# Patient Record
Sex: Female | Born: 1940 | Race: White | Hispanic: No
Health system: Midwestern US, Academic
[De-identification: ages and names within clinical notes are randomized; demographics above are authoritative.]

---

## 2016-08-05 ENCOUNTER — Ambulatory Visit: Admit: 2016-08-05 | Discharge: 2016-08-05 | Payer: MEDICARE

## 2016-08-05 ENCOUNTER — Ambulatory Visit: Admit: 2016-08-05 | Discharge: 2016-08-06 | Payer: MEDICARE

## 2016-08-05 ENCOUNTER — Encounter: Admit: 2016-08-05 | Discharge: 2016-08-05 | Payer: MEDICARE

## 2016-08-05 DIAGNOSIS — I350 Nonrheumatic aortic (valve) stenosis: ICD-10-CM

## 2016-08-05 DIAGNOSIS — I480 Paroxysmal atrial fibrillation: Principal | ICD-10-CM

## 2016-08-05 MED ORDER — PERFLUTREN LIPID MICROSPHERES 1.1 MG/ML IV SUSP
1-20 mL | Freq: Once | INTRAVENOUS | 0 refills | Status: CP
Start: 2016-08-05 — End: ?

## 2016-08-05 NOTE — Progress Notes
Peripheral IV Insertion Note:  Patient Side: right  Line Orientation:Antecubital  IV Catheter Size: 20G  Number of Attempts:1.  IV capped and flushed with Normal Saline.  IV site without redness, swelling, or pain.  New dressing placed.    After procedure IV cannula removed intact and hemostasis achieved.    Procedure explained, questions answered and Definity administered per standard without complications.   Total of 2 ml of Definity/NS given slow IVP per sonographer direction.

## 2016-08-09 ENCOUNTER — Encounter: Admit: 2016-08-09 | Discharge: 2016-08-09 | Payer: MEDICARE

## 2016-11-08 ENCOUNTER — Encounter: Admit: 2016-11-08 | Discharge: 2016-11-08 | Payer: MEDICARE

## 2016-11-08 ENCOUNTER — Ambulatory Visit: Admit: 2016-11-08 | Discharge: 2016-11-09 | Payer: MEDICARE

## 2016-11-08 DIAGNOSIS — Z9981 Dependence on supplemental oxygen: ICD-10-CM

## 2016-11-08 DIAGNOSIS — I10 Essential (primary) hypertension: ICD-10-CM

## 2016-11-08 DIAGNOSIS — R000 Tachycardia, unspecified: ICD-10-CM

## 2016-11-08 DIAGNOSIS — J449 Chronic obstructive pulmonary disease, unspecified: Principal | ICD-10-CM

## 2016-11-08 DIAGNOSIS — I350 Nonrheumatic aortic (valve) stenosis: ICD-10-CM

## 2016-11-08 DIAGNOSIS — I447 Left bundle-branch block, unspecified: ICD-10-CM

## 2016-11-08 DIAGNOSIS — R0602 Shortness of breath: ICD-10-CM

## 2016-11-08 DIAGNOSIS — K219 Gastro-esophageal reflux disease without esophagitis: ICD-10-CM

## 2016-11-09 ENCOUNTER — Encounter: Admit: 2016-11-09 | Discharge: 2016-11-09 | Payer: MEDICARE

## 2016-11-09 DIAGNOSIS — Z9981 Dependence on supplemental oxygen: ICD-10-CM

## 2016-11-09 DIAGNOSIS — K219 Gastro-esophageal reflux disease without esophagitis: ICD-10-CM

## 2016-11-09 DIAGNOSIS — J9809 Other diseases of bronchus, not elsewhere classified: ICD-10-CM

## 2016-11-09 DIAGNOSIS — I350 Nonrheumatic aortic (valve) stenosis: ICD-10-CM

## 2016-11-09 DIAGNOSIS — J449 Chronic obstructive pulmonary disease, unspecified: Principal | ICD-10-CM

## 2016-11-09 DIAGNOSIS — I10 Essential (primary) hypertension: ICD-10-CM

## 2016-11-11 ENCOUNTER — Encounter: Admit: 2016-11-11 | Discharge: 2016-11-11 | Payer: MEDICARE

## 2016-11-11 MED ORDER — CARVEDILOL 12.5 MG PO TAB
ORAL_TABLET | Freq: Two times a day (BID) | 2 refills | Status: SS
Start: 2016-11-11 — End: 2017-02-03

## 2016-12-05 ENCOUNTER — Encounter: Admit: 2016-12-05 | Discharge: 2016-12-05 | Payer: MEDICARE

## 2016-12-07 NOTE — Telephone Encounter
Received a message from [employer], Environmental health practitioner stating the patient had called to cancel her appointments for 10/2 as she is in the hospital at [facility] for diverticulitis. She asked that we call back later in the week to try and reschedule her appointments.

## 2016-12-08 NOTE — Telephone Encounter
Spoke to Ms. Sandate. Determined the best date for an appointment for evaluation of her AS.    Future Appointments  Date Time Provider [place]   01/03/2017 10:30 AM Shea Stakes, MD, [place]   01/03/2017 1:00 PM [id number] None

## 2016-12-09 NOTE — Telephone Encounter
An earlier appointment was available with Dr. Mackey Birchwood. Spoke to patient and she could make 10/8 at 4:30. Unable to get a PFT appointment on that day but with her worsening shortness of breath the earlier appointment is best. Will evaluate the need for PFT's after she is seen by Dr. Mackey Birchwood.

## 2016-12-12 ENCOUNTER — Encounter: Admit: 2016-12-12 | Discharge: 2016-12-12 | Payer: MEDICARE

## 2016-12-12 DIAGNOSIS — I350 Nonrheumatic aortic (valve) stenosis: Principal | ICD-10-CM

## 2016-12-12 DIAGNOSIS — Z01812 Encounter for preprocedural laboratory examination: ICD-10-CM

## 2016-12-12 MED ORDER — ASPIRIN 325 MG PO TAB
325 mg | Freq: Once | ORAL | 0 refills | Status: CN
Start: 2016-12-12 — End: ?

## 2016-12-12 MED ORDER — SODIUM CHLORIDE 0.9 % IV SOLP
250 mL | INTRAVENOUS | 0 refills | Status: CN | PRN
Start: 2016-12-12 — End: ?

## 2016-12-12 MED ORDER — LIDOCAINE (PF) 10 MG/ML (1 %) IJ SOLN
.1-2 mL | INTRAMUSCULAR | 0 refills | Status: CN | PRN
Start: 2016-12-12 — End: ?

## 2016-12-12 MED ORDER — SODIUM CHLORIDE 0.9 % IV SOLP
INTRAVENOUS | 0 refills | Status: CN
Start: 2016-12-12 — End: ?

## 2016-12-12 MED ORDER — ENOXAPARIN 120 MG/0.8 ML SC SYRG
120 mg | INJECTION | Freq: Two times a day (BID) | SUBCUTANEOUS | 1 refills | 7.00000 days | Status: AC
Start: 2016-12-12 — End: 2016-12-16

## 2016-12-12 NOTE — Progress Notes
Date of Service: 12/12/2016    Michelle Solis is a 76 y.o. female.       HPI     I had the pleasure of seeing Michelle Solis at the clinic today. she is a very pleasant 76 year old female which is accompanied by her daughter and granddaughter.   The past medical history including aortic stenosis. COPD. atrial flutter. morbid obesity, LBBB, cerebral aneurysm rupture and hypertension. patient was referred to our clinic for further evaluation of aortic stenosis.       Pt follow up with Dr. Dagoberto Reef. She has been follow up for AS and she was referred here considering worsening of sx and Echo in June that indicated drop in EF was 45%/calculated valve area now is 0.7 cm2.   Patient noticed worsening of shortness of breath and fatigue in the last year. She becomes SOB with mild psychical activity. She noticed LE edema and orthopnea as well.  She denies any chest pain or syncope though.  She was diagnosed with possible paroxysmal atrial flutter/atrial tach ~2013 and she was started on anticoagulation without any rhythm control. She noticed occasional dizziness at home, and she noted he rPR is ~ 110 at home, which is consistent with atrial flutter or tachy on today's EKG.    Patient was just discharged last week after being admitted for diverticulitis. Patient is compliant with medication.  We discussed the options for severe stenosis regarding open heart surgery and TAVR.  We also discussed the importance of controlling his atrial flutter/tach.            Vitals:    12/12/16 1622   BP: 138/88   Pulse: 114   SpO2: (!) 89%   Weight: 132.5 kg (292 lb)   Height: 1.626 m (5' 4)     Body mass index is 50.12 kg/m???.     Past Medical History  Patient Active Problem List    Diagnosis Date Noted   ??? Severe aortic stenosis 11/08/2016   ??? Bronchitis 03/24/2016   ??? Swelling of left lower extremity 11/24/2015   ??? Sinus tachycardia 11/24/2015   ??? Iron deficiency anemia 08/21/2014   ??? Shortness of breath 08/21/2014 ??? Right knee pain 02/25/2014   ??? Obesity, Class III, BMI 40-49.9 (morbid obesity) (HCC) 02/20/2014   ??? Chronic respiratory failure with hypoxia (HCC) 09/26/2013   ??? severe aortic valve stenosis 09/26/2013   ??? Mild diastolic dysfunction 09/26/2013   ??? LBBB (left bundle branch block) 09/26/2013   ??? Fracture, proximal femur (HCC) 06/14/2013     S/p Rt CMN 06/15/13     ??? A-fib [facility]) 09/13/2011     Diagnosed June 2013     ??? 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 COPD (chronic obstructive pulmonary disease) (HCC) 09/13/2011   ??? Hypertension 09/13/2011   ??? GERD (gastroesophageal reflux disease) 09/13/2011         Review of Systems   Constitution: Positive for weakness, malaise/fatigue and night sweats.   HENT: Positive for nosebleeds.    Eyes: Negative.    Cardiovascular: Positive for dyspnea on exertion, irregular heartbeat, leg swelling and palpitations.   Respiratory: Positive for cough and shortness of breath.    Endocrine: Negative.    Hematologic/Lymphatic: Bruises/bleeds easily.   Skin: Positive for dry skin.   Musculoskeletal: Positive for arthritis, back pain and joint pain.   Gastrointestinal: Positive for bloating, abdominal pain, constipation  and heartburn.   Genitourinary: Positive for bladder incontinence.   Psychiatric/Behavioral: The patient is nervous/anxious.    Allergic/Immunologic: Negative.    All other systems reviewed and are negative.      Physical Exam   Constitutional: She appears well-developed.   HENT:   Head: Normocephalic and atraumatic.   Eyes: Pupils are equal, round, and reactive to light. Conjunctivae are normal.   Neck: Neck supple.   JVD could not be assessed due to neck girth   Cardiovascular:   Murmur heard.  Irregular, 3/6 systolic murmur best hear in RSB   Pulmonary/Chest: She has no wheezes. She has rales.   Abdominal:   abdominal obesity   Musculoskeletal: She exhibits edema. Neurological: She is alert and oriented to person, place, and time.         Cardiovascular Studies  EKG atrial flutter/Tachy with 2:1    Problems Addressed Today  No diagnosis found.    Assessment and Plan     Severe aortic stenosis, LF LG AS (stage D2)  Echo finding in 2014 was as below  Aortic valve area: 1.40 cm2  Aortic valve mean gradient: 30 mmHg          Aortic valve peak gradient: 52 mmHg   Aortic valve peak velocity: 3.6 m/s   In 2017 and recent Echo in 2018 showed     Aortic valve area: 0.70 cm2  Aortic valve mean gradient: 14.20 mmHg          Aortic valve peak gradient: 26 mmHg   Aortic valve peak velocity: 2.6 m/s Aortic valve velocity ratio: 0.23     There is a significant drop in peak velocity which is consistent with worsening of EF in the setting of low flow state. However, calculated valve area is in the range of severe AS.   Although we can proceed with dobutamine stress test for further evaluation of low flow low gradient severe AS, constellation of sx, worsening EF and AV on echo is consistent with severe AS.  We will start TAVI work up including cardiac cath. We talked about nature of severe AS and prognosis in the symptomatic state. We discussed with patient regarding risks, benefits and potential options for severe AS.      Persistent Atrial flutter/Tach  - I reviewed all EKG since 2013 and Pt had same AFL or AT with 2:1 in most of those EKG. There are couple EKG which pt had NSR as well. However, prior EKG in sept and today's EKG is consistent with AFL/AT.  Also, Pt noted her PR is usually ~ 110 at home, which is consistent with today EKG. Although worsening of EF and Pt's Sx could be due to AS, underlying atrial arrhythmia is also contributing. Pt is already anticoagulated with warfarin and she is on Carvedilol. We are going to get an EP consult for possible ablation and possible AAD for rhythm control.  - Will get a TSH (there is no TSH on file)    HFrEF - We reviewed the Echos from 10/2016 and 2017 which was actually very similar. EF appears to be low (40%) even on Echo 2017.It is likely the worsening of EF happened sometimes between 2015 and 2017. 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 Pt will get ischemic evaluation (cardiac cath as part of TAVR work up as well). Pt is on Lasix, Coreg, lisinopril and Coreg.       I have personally interviewed and examined the patient, have reviewed the  EMR & all pertinent medical documentation including the history, ROS, physical exam, labs, radiologic studies, telemetry, ECGs, & CV studies, and jointly formulated the problem list & treatment plan for Michelle Solis  with cardiology fellow Dr. Kelvin Cellar    Elayne Snare, MD, [facility] ([facility])          Current Medications (including today's revisions)  ??? acetaminophen (TYLENOL) 325 mg tablet Take 2 Tabs by mouth every 4 hours as needed. (Patient taking differently: Take 650 mg by mouth as Needed.)   ??? albuterol-ipratropium (DUO-NEB, DUO-VENT) 0.5 mg-3 mg(2.5 mg base)/3 mL nebulizer solution Inhale 3 mL solution by nebulizer as directed four times daily.   ??? amitriptyline (ELAVIL) 50 mg tablet Take 50 mg by mouth at bedtime daily.   ??? aspirin EC 81 mg tablet Take 1 Tab by mouth daily.   ??? budesonide respule (PULMICORT) 0.25 mg/2 mL nebulizer solution Inhale 0.25 mg solution by nebulizer as directed twice daily.   ??? CALCIUM CARBONATE/VITAMIN D3 (CALCIUM + D PO) Take 600 mg by mouth daily.   ??? carvedilol (COREG) 12.5 mg tablet TAKE ONE TABLET BY MOUTH TWICE DAILY WITH MEALS TAKE  WITH  FOOD.   ??? cefdinir (OMNICEF) 300 mg capsule Take 300 mg by mouth twice daily.   ??? ferrous sulfate 325 mg (65 mg iron) tablet Take 325 mg by mouth twice daily.   ??? furosemide (LASIX) 40 mg tablet Take 1 Tab by mouth every morning.   ??? lisinopril (PRINIVIL; ZESTRIL) 20 mg tablet Take 20 mg by mouth daily.   ??? metroNIDAZOLE (FLAGYL) 500 mg tablet Take 500 mg by mouth twice daily. Take with food. Do not drink alcohol while on metronidazole.   ??? multivit-min-FA-Ca carb-vit K 400 mcg-500 mg calcium-20 mcg Tab Take  by mouth daily.   ??? omeprazole DR(+) (PRILOSEC) 40 mg capsule Take 40 mg by mouth daily.   ??? tiotropium bromide (SPRIVA RESPIMAT) 1.25 mcg/actuation inhaler Inhale 2 puffs by mouth into the lungs daily.   ??? warfarin (COUMADIN) 2 mg tablet Take 2 mg by mouth. 4 times week as directed by INR   ??? warfarin (COUMADIN) 2.5 mg tablet Take 1 Tab by mouth daily. (Patient taking differently: Take 2.5 mg by mouth daily. Mondays and Fridays 2 mgs)   ??? warfarin (COUMADIN) 3 mg tablet Take 3 mg by mouth. 3 times week as directed by INR

## 2016-12-13 ENCOUNTER — Ambulatory Visit: Admit: 2016-12-12 | Discharge: 2016-12-13 | Payer: MEDICARE

## 2016-12-13 ENCOUNTER — Encounter: Admit: 2016-12-13 | Discharge: 2016-12-13 | Payer: MEDICARE

## 2016-12-13 DIAGNOSIS — I2510 Atherosclerotic heart disease of native coronary artery without angina pectoris: Secondary | ICD-10-CM

## 2016-12-13 DIAGNOSIS — I5030 Unspecified diastolic (congestive) heart failure: ICD-10-CM

## 2016-12-13 DIAGNOSIS — R0602 Shortness of breath: ICD-10-CM

## 2016-12-13 DIAGNOSIS — I447 Left bundle-branch block, unspecified: ICD-10-CM

## 2016-12-13 DIAGNOSIS — I4892 Unspecified atrial flutter: ICD-10-CM

## 2016-12-13 DIAGNOSIS — I10 Essential (primary) hypertension: ICD-10-CM

## 2016-12-13 LAB — BASIC METABOLIC PANEL
Lab: 102 g/dL — ABNORMAL LOW (ref 6.0–8.0)
Lab: 141 mg/dL (ref 0.4–1.00)
Lab: 3.6 mg/dL (ref 8.5–10.6)

## 2016-12-13 LAB — CBC
Lab: 11 mg/dL — ABNORMAL LOW (ref 70–100)
Lab: 38 mg/dL (ref 7–25)
Lab: 4 MMOL/L — ABNORMAL LOW (ref 98–110)
Lab: 9.4 MMOL/L — ABNORMAL LOW (ref 3.5–5.1)

## 2016-12-13 NOTE — Progress Notes
Medicare is listed as patient's primary insurance coverage.  Pre-certification is not required for hospitalizations.

## 2016-12-16 ENCOUNTER — Ambulatory Visit: Admit: 2016-12-16 | Discharge: 2016-12-16 | Payer: MEDICARE

## 2016-12-16 ENCOUNTER — Encounter: Admit: 2016-12-16 | Discharge: 2016-12-16 | Payer: MEDICARE

## 2016-12-16 DIAGNOSIS — I10 Essential (primary) hypertension: ICD-10-CM

## 2016-12-16 DIAGNOSIS — Z9981 Dependence on supplemental oxygen: ICD-10-CM

## 2016-12-16 DIAGNOSIS — J9809 Other diseases of bronchus, not elsewhere classified: ICD-10-CM

## 2016-12-16 DIAGNOSIS — I350 Nonrheumatic aortic (valve) stenosis: ICD-10-CM

## 2016-12-16 DIAGNOSIS — J449 Chronic obstructive pulmonary disease, unspecified: Principal | ICD-10-CM

## 2016-12-16 DIAGNOSIS — K219 Gastro-esophageal reflux disease without esophagitis: ICD-10-CM

## 2016-12-16 DIAGNOSIS — I4819 Other persistent atrial fibrillation: Principal | ICD-10-CM

## 2016-12-16 DIAGNOSIS — I671 Cerebral aneurysm, nonruptured: ICD-10-CM

## 2016-12-16 DIAGNOSIS — I447 Left bundle-branch block, unspecified: ICD-10-CM

## 2016-12-16 DIAGNOSIS — I4892 Unspecified atrial flutter: ICD-10-CM

## 2016-12-16 DIAGNOSIS — I519 Heart disease, unspecified: ICD-10-CM

## 2016-12-16 MED ORDER — DRONEDARONE 400 MG PO TAB
400 mg | ORAL_TABLET | Freq: Two times a day (BID) | ORAL | 11 refills | Status: AC
Start: 2016-12-16 — End: 2016-12-16

## 2016-12-16 MED ORDER — DRONEDARONE 400 MG PO TAB
400 mg | ORAL_TABLET | Freq: Two times a day (BID) | ORAL | 11 refills | Status: AC
Start: 2016-12-16 — End: 2017-02-23

## 2016-12-16 MED ORDER — ENOXAPARIN 120 MG/0.8 ML SC SYRG
120 mg | INJECTION | Freq: Two times a day (BID) | SUBCUTANEOUS | 1 refills | 7.00000 days | Status: AC
Start: 2016-12-16 — End: 2016-12-19

## 2016-12-16 NOTE — Telephone Encounter
Pt called nurse line and left a vm requesting a call back re: changing one of her new rxs to another pharmacy. Called pt back. Pt advised her enoxaparin injections are very expensive through [facility] but she can get them much cheaper through her mail in pharmacy. Pt requested rx to be sent to [facility] (and at least an 11 day order (22 injections). Pt requested we note to expedite order as she will need them at the end of next week.  Advised pt we will send a new rx to [facility] as requested.

## 2016-12-19 MED ORDER — ENOXAPARIN 120 MG/0.8 ML SC SYRG
120 mg | INJECTION | Freq: Two times a day (BID) | SUBCUTANEOUS | 0 refills | Status: SS
Start: 2016-12-19 — End: 2016-12-28

## 2016-12-19 NOTE — Telephone Encounter
Rec'd a fax from [facility] advising that pt's benefit plan requires a 31 day supply of a medication and to resubmit another rx for the enoxaparin if it was appropriate. Contacted pt with this update. Pt noted she wants an order of 62 syringes so she can pay the lesser monthly copay. Pt will need to be bridged for a few upcoming procedures (heart cath and TAVR). Will resubmit and request an overnight/expedited rx per pt request.

## 2016-12-21 ENCOUNTER — Encounter: Admit: 2016-12-21 | Discharge: 2016-12-21 | Payer: MEDICARE

## 2016-12-21 MED ORDER — FUROSEMIDE 40 MG PO TAB
ORAL_TABLET | Freq: Every day | ORAL | 3 refills | 90.00000 days | Status: AC
Start: 2016-12-21 — End: ?

## 2016-12-23 ENCOUNTER — Encounter: Admit: 2016-12-23 | Discharge: 2016-12-23 | Payer: MEDICARE

## 2016-12-23 DIAGNOSIS — I350 Nonrheumatic aortic (valve) stenosis: Principal | ICD-10-CM

## 2016-12-23 DIAGNOSIS — Z01812 Encounter for preprocedural laboratory examination: ICD-10-CM

## 2016-12-23 MED ORDER — MAGNESIUM HYDROXIDE 2,400 MG/10 ML PO SUSP
10 mL | ORAL | 0 refills | Status: CN | PRN
Start: 2016-12-23 — End: ?

## 2016-12-23 MED ORDER — ALUMINUM-MAGNESIUM HYDROXIDE 200-200 MG/5 ML PO SUSP
30 mL | ORAL | 0 refills | Status: CN | PRN
Start: 2016-12-23 — End: ?

## 2016-12-23 MED ORDER — TEMAZEPAM 15 MG PO CAP
15 mg | Freq: Every evening | ORAL | 0 refills | Status: CN | PRN
Start: 2016-12-23 — End: ?

## 2016-12-23 NOTE — H&P (View-Only)
Patient presents for procedure. Please see most recent H/P from 10/081/8  below.     Michelle Parkinson, APRN-C  Pager [phone]     _____________________________________________________________________________    Office Visit     12/12/2016  Cardiovascular Medicine   Michelle Barlow, MD, [facility]   Cardiology   severe aortic valve stenosis +4 more   Dx   New To Provider ; Referred by Lona Kettle, MD   Reason for Visit    Progress Notes   Michelle Barlow, MD, [facility] (Physician) ??? ??? Cardiology ??? ??? 12/12/16 1630 ??? ??? Signed      Date of Service: 12/12/2016  ???  VERTINA Solis is a 76 y.o. female.     ???  HPI  I had the pleasure of seeing Michelle . Solis at the clinic today. she is a very pleasant 76 year old female which is accompanied by her daughter and granddaughter.   The past medical history including aortic stenosis. COPD. atrial flutter. morbid obesity, LBBB, cerebral aneurysm rupture and hypertension. patient was referred to our clinic for further evaluation of aortic stenosis.   ???  ???  Pt follow up with Dr. Dagoberto Reef. She has been follow up for AS and she was referred here considering worsening of sx and Echo in June that indicated drop in EF was 45%/calculated valve area now is 0.7 cm2. ??? Patient noticed worsening of shortness of breath and fatigue in the last year. She becomes SOB with mild psychical activity. She noticed LE edema and orthopnea as well.  She denies any chest pain or syncope though.  She was diagnosed with possible paroxysmal atrial flutter/atrial tach ~2013 and she was started on anticoagulation without any rhythm control. She noticed occasional dizziness at home, and she noted he rPR is ~ 110 at home, which is consistent with atrial flutter or tachy on today's EKG.  ???  Patient was just discharged last week after being admitted for diverticulitis. Patient is compliant with medication.  We discussed the options for severe stenosis regarding open heart surgery and TAVR.  We also discussed the importance of controlling his atrial flutter/tach.  ???  ???  ???      Vitals:   ??? 12/12/16 1622   BP: 138/88   Pulse: 114   SpO2: (!) 89%   Weight: 132.5 kg (292 lb)   Height: 1.626 m (5' 4)   ???  Body mass index is 50.12 kg/m???.   ???  Past Medical History        Patient Active Problem List   ??? Diagnosis Date Noted   ??? Severe aortic stenosis 11/08/2016   ??? Bronchitis 03/24/2016   ??? Swelling of left lower extremity 11/24/2015   ??? Sinus tachycardia 11/24/2015   ??? Iron deficiency anemia 08/21/2014   ??? Shortness of breath 08/21/2014   ??? Right knee pain 02/25/2014   ??? Obesity, Class III, BMI 40-49.9 (morbid obesity) (HCC) 02/20/2014   ??? Chronic respiratory failure with hypoxia (HCC) 09/26/2013   ??? severe aortic valve stenosis 09/26/2013   ??? Mild diastolic dysfunction 09/26/2013   ??? LBBB (left bundle branch block) 09/26/2013   ??? Fracture, proximal femur (HCC) 06/14/2013   ??? ??? S/p Rt CMN 06/15/13  ???   ??? A-fib (HCC) 09/13/2011   ??? ??? Diagnosed June 2013  ???   ??? 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 ??? 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   ??? COPD (chronic obstructive pulmonary disease) (  HCC) 09/13/2011   ??? Hypertension 09/13/2011   ??? GERD (gastroesophageal reflux disease) 09/13/2011   ???  ???  ???  Allergies   Allergen Reactions   ??? Avelox [Moxifloxacin] UNKNOWN   ??? Ciprofloxacin UNKNOWN   ??? Sulfa (Sulfonamide Antibiotics) UNKNOWN     Family History   Problem Relation Age of Onset   ??? Cancer Mother      Past Surgical History:   Procedure Laterality Date   ??? BACK SURGERY     ??? CHOLECYSTECTOMY     ??? HYSTERECTOMY     ??? KNEE REPLACEMENT     ??? TONSILLECTOMY       Social History     Social History   ??? Marital status: Divorced     Spouse name: N/A   ??? Number of children: N/A   ??? Years of education: N/A     Social History Main Topics   ??? Smoking status: Former Smoker     Packs/day: 3.00     Years: 35.00     Types: Cigarettes     Quit date: 03/07/1994   ??? Smokeless tobacco: Never Used   ??? Alcohol use No ??? Drug use: No   ??? Sexual activity: Not on file     Other Topics Concern   ??? Not on file     Social History Narrative   ??? No narrative on file       Review of Systems   Constitution: Positive for weakness, malaise/fatigue and night sweats.   HENT: Positive for nosebleeds.    Eyes: Negative.    Cardiovascular: Positive for dyspnea on exertion, irregular heartbeat, leg swelling and palpitations.   Respiratory: Positive for cough and shortness of breath.    Endocrine: Negative.    Hematologic/Lymphatic: Bruises/bleeds easily.   Skin: Positive for dry skin.   Musculoskeletal: Positive for arthritis, back pain and joint pain.   Gastrointestinal: Positive for bloating, abdominal pain, constipation and heartburn.   Genitourinary: Positive for bladder incontinence.   Psychiatric/Behavioral: The patient is nervous/anxious.    Allergic/Immunologic: Negative.    All other systems reviewed and are negative.  ???  ???  Physical Exam   Constitutional: She appears well-developed.   HENT:   Head: Normocephalic and atraumatic.   Eyes: Pupils are equal, round, and reactive to light. Conjunctivae are normal.   Neck: Neck supple.   JVD could not be assessed due to neck girth   Cardiovascular:   Murmur heard.  Irregular, 3/6 systolic murmur best hear in RSB   Pulmonary/Chest: She has no wheezes. She has rales.   Abdominal:   abdominal obesity   Musculoskeletal: She exhibits edema.   Neurological: She is alert and oriented to person, place, and time.   ???  ???  ???  Cardiovascular Studies  EKG atrial flutter/Tachy with 2:1  ???  Problems Addressed Today  No diagnosis found.  ???  Assessment and Plan  Severe aortic stenosis, LF LG AS (stage D2)  Echo finding in 2014 was as below  Aortic valve area: 1.40 cm2  Aortic valve mean gradient: 30 mmHg ???????????????????????????Aortic valve peak gradient: 52 mmHg   Aortic valve peak velocity: 3.6 m/s   In 2017 and recent Echo in 2018 showed         Aortic valve area: 0.70 cm2 Aortic valve mean gradient: 14.20 mmHg ???????????????????????????Aortic valve peak gradient: 26 mmHg   Aortic valve peak velocity: 2.6 m/s Aortic valve velocity ratio: 0.23   ???  There is a significant  drop in peak velocity which is consistent with worsening of EF in the setting of low flow state. However, calculated valve area is in the range of severe AS.   Although we can proceed with dobutamine stress test for further evaluation of low flow low gradient severe AS, constellation of sx, worsening EF and AV on echo is consistent with severe AS.  We will start TAVI work up including cardiac cath. We talked about nature of severe AS and prognosis in the symptomatic state. We discussed with patient regarding risks, benefits and potential options for severe AS.  ???  ???  Persistent Atrial flutter/Tach  - I reviewed all EKG since 2013 and Pt had same AFL or AT with 2:1 in most of those EKG. There are couple EKG which pt had NSR as well. However, prior EKG in sept and today's EKG is consistent with AFL/AT.  Also, Pt noted her PR is usually ~ 110 at home, which is consistent with today EKG. Although worsening of EF and Pt's Sx could be due to AS, underlying atrial arrhythmia is also contributing. Pt is already anticoagulated with warfarin and she is on Carvedilol. We are going to get an EP consult for possible ablation and possible AAD for rhythm control.  - Will get a TSH (there is no TSH on file)  ???  HFrEF  - We reviewed the Echos from 10/2016 and 2017 which was actually very similar. EF appears to be low (40%) even on Echo 2017.It is likely the worsening of EF happened sometimes between 2015 and 2017. 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 Pt will get ischemic evaluation (cardiac cath as part of TAVR work up as well). Pt is on Lasix, Coreg, lisinopril and Coreg.  ???  I have personally interviewed and examined the patient, have reviewed the EMR & all pertinent medical documentation including the history, ROS, physical exam, labs, radiologic studies, telemetry, ECGs, & CV studies, and jointly formulated the problem list & treatment plan for Duncan Dull  with cardiology fellow Dr. Ignacia Felling Masoomi  ???  Elayne Snare, MD, [facility]  ???  ???  ???  ???  Current Medications (including today's revisions)  ??? acetaminophen (TYLENOL) 325 mg tablet Take 2 Tabs by mouth every 4 hours as needed. (Patient taking differently: Take 650 mg by mouth as Needed.)   ??? albuterol-ipratropium (DUO-NEB, DUO-VENT) 0.5 mg-3 mg(2.5 mg base)/3 mL nebulizer solution Inhale 3 mL solution by nebulizer as directed four times daily.   ??? amitriptyline (ELAVIL) 50 mg tablet Take 50 mg by mouth at bedtime daily.   ??? aspirin EC 81 mg tablet Take 1 Tab by mouth daily.   ??? budesonide respule (PULMICORT) 0.25 mg/2 mL nebulizer solution Inhale 0.25 mg solution by nebulizer as directed twice daily.   ??? CALCIUM CARBONATE/VITAMIN D3 (CALCIUM + D PO) Take 600 mg by mouth daily.   ??? carvedilol (COREG) 12.5 mg tablet TAKE ONE TABLET BY MOUTH TWICE DAILY WITH MEALS TAKE  WITH  FOOD.   ??? cefdinir (OMNICEF) 300 mg capsule Take 300 mg by mouth twice daily.   ??? ferrous sulfate 325 mg (65 mg iron) tablet Take 325 mg by mouth twice daily.   ??? furosemide (LASIX) 40 mg tablet Take 1 Tab by mouth every morning.   ??? lisinopril (PRINIVIL; ZESTRIL) 20 mg tablet Take 20 mg by mouth daily.   ??? metroNIDAZOLE (FLAGYL) 500 mg tablet Take 500 mg by mouth twice daily. Take with food. Do not  drink alcohol while on metronidazole.   ??? multivit-min-FA-Ca carb-vit K 400 mcg-500 mg calcium-20 mcg Tab Take  by mouth daily.   ??? omeprazole DR(+) (PRILOSEC) 40 mg capsule Take 40 mg by mouth daily.   ??? tiotropium bromide (SPRIVA RESPIMAT) 1.25 mcg/actuation inhaler Inhale 2 puffs by mouth into the lungs daily.   ??? warfarin (COUMADIN) 2 mg tablet Take 2 mg by mouth. 4 times week as directed by INR   ??? warfarin (COUMADIN) 2.5 mg tablet Take 1 Tab by mouth daily. (Patient taking differently: Take 2.5 mg by mouth daily. Mondays and Fridays 2 mgs)   ??? warfarin (COUMADIN) 3 mg tablet Take 3 mg by mouth. 3 times week as directed by INR   ???  ???

## 2016-12-27 ENCOUNTER — Encounter: Admit: 2016-12-27 | Discharge: 2016-12-27 | Payer: MEDICARE

## 2016-12-27 ENCOUNTER — Ambulatory Visit: Admit: 2016-12-27 | Discharge: 2016-12-27 | Payer: MEDICARE

## 2016-12-27 DIAGNOSIS — I480 Paroxysmal atrial fibrillation: Secondary | ICD-10-CM

## 2016-12-27 LAB — POC PT/INR: Lab: 1.2 (ref 0.8–1.2)

## 2016-12-27 MED ORDER — SODIUM CHLORIDE 0.9 % IV SOLP
INTRAVENOUS | 0 refills | Status: DC
Start: 2016-12-27 — End: 2016-12-27

## 2016-12-27 MED ORDER — ALUMINUM-MAGNESIUM HYDROXIDE 200-200 MG/5 ML PO SUSP
30 mL | ORAL | 0 refills | Status: DC | PRN
Start: 2016-12-27 — End: 2016-12-28

## 2016-12-27 MED ORDER — ALBUTEROL SULFATE 2.5 MG/0.5 ML IN NEBU
2.5 mg | Freq: Two times a day (BID) | RESPIRATORY_TRACT | 0 refills | Status: DC | PRN
Start: 2016-12-27 — End: 2016-12-28
  Administered 2016-12-28: 14:00:00 2.5 mg via RESPIRATORY_TRACT

## 2016-12-27 MED ORDER — DRONEDARONE 400 MG PO TAB
400 mg | Freq: Two times a day (BID) | ORAL | 0 refills | Status: DC
Start: 2016-12-27 — End: 2016-12-28
  Administered 2016-12-28 (×2): 400 mg via ORAL

## 2016-12-27 MED ORDER — DIPHENHYDRAMINE HCL 25 MG PO CAP
25 mg | ORAL | 0 refills | Status: DC | PRN
Start: 2016-12-27 — End: 2016-12-28

## 2016-12-27 MED ORDER — CARVEDILOL 12.5 MG PO TAB
12.5 mg | Freq: Two times a day (BID) | ORAL | 0 refills | Status: DC
Start: 2016-12-27 — End: 2016-12-28
  Administered 2016-12-28 (×2): 12.5 mg via ORAL

## 2016-12-27 MED ORDER — BUDESONIDE 0.25 MG/2 ML IN NBSP
.25 mg | Freq: Every day | RESPIRATORY_TRACT | 0 refills | Status: CN
Start: 2016-12-27 — End: ?

## 2016-12-27 MED ORDER — LIDOCAINE (PF) 10 MG/ML (1 %) IJ SOLN
.1-2 mL | INTRAMUSCULAR | 0 refills | Status: DC | PRN
Start: 2016-12-27 — End: 2016-12-28

## 2016-12-27 MED ORDER — IPRATROPIUM BROMIDE 0.02 % IN SOLN
0.5 mg | RESPIRATORY_TRACT | 0 refills | Status: DC | PRN
Start: 2016-12-27 — End: 2016-12-27
  Administered 2016-12-27: 23:00:00 0.5 mg via RESPIRATORY_TRACT

## 2016-12-27 MED ORDER — FERROUS SULFATE 325 MG (65 MG IRON) PO TAB
325 mg | Freq: Two times a day (BID) | ORAL | 0 refills | Status: DC
Start: 2016-12-27 — End: 2016-12-28
  Administered 2016-12-28 (×2): 325 mg via ORAL

## 2016-12-27 MED ORDER — ASPIRIN 81 MG PO TBEC
81 mg | Freq: Every day | ORAL | 0 refills | Status: DC
Start: 2016-12-27 — End: 2016-12-28
  Administered 2016-12-28: 16:00:00 81 mg via ORAL

## 2016-12-27 MED ORDER — DRONEDARONE 400 MG PO TAB
400 mg | Freq: Two times a day (BID) | ORAL | 0 refills | Status: CN
Start: 2016-12-27 — End: ?

## 2016-12-27 MED ORDER — ACETAMINOPHEN 325 MG PO TAB
650 mg | ORAL | 0 refills | Status: CN | PRN
Start: 2016-12-27 — End: ?

## 2016-12-27 MED ORDER — ASPIRIN 81 MG PO TBEC
81 mg | Freq: Every day | ORAL | 0 refills | Status: CN
Start: 2016-12-27 — End: ?

## 2016-12-27 MED ORDER — LISINOPRIL 20 MG PO TAB
20 mg | Freq: Every day | ORAL | 0 refills | Status: DC
Start: 2016-12-27 — End: 2016-12-28
  Administered 2016-12-28: 16:00:00 20 mg via ORAL

## 2016-12-27 MED ORDER — ASPIRIN 325 MG PO TAB
325 mg | Freq: Once | ORAL | 0 refills | Status: DC
Start: 2016-12-27 — End: 2016-12-27

## 2016-12-27 MED ORDER — IPRATROPIUM-ALBUTEROL 0.5 MG-3 MG(2.5 MG BASE)/3 ML IN NEBU
3 mL | RESPIRATORY_TRACT | 0 refills | Status: CN | PRN
Start: 2016-12-27 — End: ?

## 2016-12-27 MED ORDER — TIOTROPIUM BROMIDE 18 MCG IN CPDV
1 | Freq: Every day | RESPIRATORY_TRACT | 0 refills | Status: DC
Start: 2016-12-27 — End: 2016-12-28
  Administered 2016-12-28: 14:00:00 1 via RESPIRATORY_TRACT

## 2016-12-27 MED ORDER — AMITRIPTYLINE 25 MG PO TAB
50 mg | Freq: Every evening | ORAL | 0 refills | Status: CN
Start: 2016-12-27 — End: ?

## 2016-12-27 MED ORDER — WARFARIN 3 MG PO TAB
3 mg | ORAL | 0 refills | Status: CN
Start: 2016-12-27 — End: ?

## 2016-12-27 MED ORDER — ONDANSETRON HCL (PF) 4 MG/2 ML IJ SOLN
4 mg | INTRAVENOUS | 0 refills | Status: DC | PRN
Start: 2016-12-27 — End: 2016-12-28

## 2016-12-27 MED ORDER — ATORVASTATIN 40 MG PO TAB
40 mg | Freq: Every day | ORAL | 0 refills | Status: DC
Start: 2016-12-27 — End: 2016-12-28

## 2016-12-27 MED ORDER — SODIUM CHLORIDE 0.9 % IV SOLP
250 mL | INTRAVENOUS | 0 refills | Status: DC | PRN
Start: 2016-12-27 — End: 2016-12-28

## 2016-12-27 MED ORDER — WARFARIN 2.5 MG PO TAB
2.5 mg | Freq: Every day | ORAL | 0 refills | Status: CN
Start: 2016-12-27 — End: ?

## 2016-12-27 MED ORDER — FUROSEMIDE 40 MG PO TAB
40 mg | Freq: Every morning | ORAL | 0 refills | Status: CN
Start: 2016-12-27 — End: ?

## 2016-12-27 MED ORDER — WARFARIN 2 MG PO TAB
2 mg | ORAL | 0 refills | Status: CN
Start: 2016-12-27 — End: ?

## 2016-12-27 MED ORDER — AMITRIPTYLINE 25 MG PO TAB
50 mg | Freq: Every evening | ORAL | 0 refills | Status: DC
Start: 2016-12-27 — End: 2016-12-28
  Administered 2016-12-28 (×2): 50 mg via ORAL

## 2016-12-27 MED ORDER — ALBUTEROL SULFATE 2.5 MG/0.5 ML IN NEBU
2.5 mg | RESPIRATORY_TRACT | 0 refills | Status: DC | PRN
Start: 2016-12-27 — End: 2016-12-27
  Administered 2016-12-27: 23:00:00 2.5 mg via RESPIRATORY_TRACT

## 2016-12-27 MED ORDER — ACETAMINOPHEN 325 MG PO TAB
650 mg | ORAL | 0 refills | Status: DC | PRN
Start: 2016-12-27 — End: 2016-12-28
  Administered 2016-12-28: 12:00:00 650 mg via ORAL

## 2016-12-27 MED ORDER — BUDESONIDE 0.25 MG/2 ML IN NBSP
.25 mg | Freq: Two times a day (BID) | RESPIRATORY_TRACT | 0 refills | Status: DC
Start: 2016-12-27 — End: 2016-12-28
  Administered 2016-12-28 (×2): 0.25 mg via RESPIRATORY_TRACT

## 2016-12-27 MED ORDER — ATORVASTATIN 40 MG PO TAB
40 mg | Freq: Every day | ORAL | 0 refills | Status: DC
Start: 2016-12-27 — End: 2016-12-28
  Administered 2016-12-28: 16:00:00 40 mg via ORAL

## 2016-12-27 MED ORDER — WARFARIN 5 MG PO TAB
5 mg | Freq: Every evening | ORAL | 0 refills | Status: DC
Start: 2016-12-27 — End: 2016-12-28
  Administered 2016-12-28: 02:00:00 5 mg via ORAL

## 2016-12-27 MED ORDER — FERROUS SULFATE 325 MG (65 MG IRON) PO TAB
325 mg | Freq: Two times a day (BID) | ORAL | 0 refills | Status: CN
Start: 2016-12-27 — End: ?

## 2016-12-27 MED ORDER — SODIUM CHLORIDE 0.9 % IV SOLP
INTRAVENOUS | 0 refills | Status: AC
Start: 2016-12-27 — End: ?

## 2016-12-27 MED ORDER — IPRATROPIUM-ALBUTEROL 0.5 MG-3 MG(2.5 MG BASE)/3 ML IN NEBU
3 mL | RESPIRATORY_TRACT | 0 refills | Status: DC | PRN
Start: 2016-12-27 — End: 2016-12-27

## 2016-12-27 MED ORDER — MAGNESIUM HYDROXIDE 2,400 MG/10 ML PO SUSP
10 mL | ORAL | 0 refills | Status: DC | PRN
Start: 2016-12-27 — End: 2016-12-28

## 2016-12-27 MED ORDER — TEMAZEPAM 15 MG PO CAP
15 mg | Freq: Every evening | ORAL | 0 refills | Status: DC | PRN
Start: 2016-12-27 — End: 2016-12-28

## 2016-12-27 MED ORDER — LISINOPRIL 20 MG PO TAB
20 mg | Freq: Every day | ORAL | 0 refills | Status: CN
Start: 2016-12-27 — End: ?

## 2016-12-27 MED ORDER — DIPHENHYDRAMINE HCL 50 MG/ML IJ SOLN
25 mg | INTRAVENOUS | 0 refills | Status: DC | PRN
Start: 2016-12-27 — End: 2016-12-28

## 2016-12-27 NOTE — Progress Notes
RT Adult Assessment Note    NAME:Michelle Solis             MRN: [id number]             DOB:June 27, 1940          AGE: 76 y.o.  ADMISSION DATE: 12/27/2016             DAYS ADMITTED: LOS: 0 days    RT Treatment Plan:  Protocol Plan: Medications  Albuterol: Neb BID  Tiotropium: MDI Q Day    Protocol Plan: Procedures  IPPB: Place a nursing order for "IS Q1h While Awake" for any of Lung Expansion indicators  Oxygen/Humidity: O2 to keep SpO2 > 92%  Monitoring: Pulse oximetry BID & PRN    Vital Signs:  Pulse: Pulse: 86  RR: Respirations: 20 PER MINUTE  SpO2: SpO2: 97 %  O2 Device: $$ O2 Device: Cannula  Liter Flow: O2 Liter Flow: 3 lpm  O2%:    Breath Sounds: All Breath Sounds: Clear (implies normal)  Respiratory Effort:

## 2016-12-27 NOTE — Progress Notes
Patient arrived on unit via wheelchair accompanied by family. Patient transferred to the chair without assistance. Frailty score equals 5  Assessment completed, refer to flowsheet for details. Orders released, reviewed, and implemented as appropriate. Oriented to surroundings, call light within reach. Plan of care reviewed.  Will continue to monitor and assess.

## 2016-12-28 ENCOUNTER — Encounter: Admit: 2016-12-27 | Discharge: 2016-12-28 | Payer: MEDICARE

## 2016-12-28 ENCOUNTER — Ambulatory Visit: Admit: 2016-12-28 | Discharge: 2016-12-29 | Payer: MEDICARE

## 2016-12-28 ENCOUNTER — Ambulatory Visit: Admit: 2016-12-27 | Discharge: 2016-12-28 | Payer: MEDICARE

## 2016-12-28 DIAGNOSIS — R0602 Shortness of breath: ICD-10-CM

## 2016-12-28 DIAGNOSIS — J9611 Chronic respiratory failure with hypoxia: ICD-10-CM

## 2016-12-28 DIAGNOSIS — I2510 Atherosclerotic heart disease of native coronary artery without angina pectoris: Secondary | ICD-10-CM

## 2016-12-28 DIAGNOSIS — I480 Paroxysmal atrial fibrillation: ICD-10-CM

## 2016-12-28 DIAGNOSIS — I10 Essential (primary) hypertension: ICD-10-CM

## 2016-12-28 DIAGNOSIS — Z9981 Dependence on supplemental oxygen: ICD-10-CM

## 2016-12-28 DIAGNOSIS — Z01810 Encounter for preprocedural cardiovascular examination: ICD-10-CM

## 2016-12-28 DIAGNOSIS — I350 Nonrheumatic aortic (valve) stenosis: Principal | ICD-10-CM

## 2016-12-28 LAB — BASIC METABOLIC PANEL: Lab: 139 MMOL/L — ABNORMAL LOW (ref >60)

## 2016-12-28 LAB — CBC: Lab: 6.7 K/UL — ABNORMAL LOW (ref 4.5–11.0)

## 2016-12-28 LAB — PROTIME INR (PT): Lab: 1.3 pg — ABNORMAL HIGH (ref 0.8–1.2)

## 2016-12-28 LAB — LIPID PROFILE: Lab: 156 mg/dL — ABNORMAL HIGH (ref <200)

## 2016-12-28 MED ORDER — IOPAMIDOL 76 % IV SOLN
100 mL | Freq: Once | INTRAVENOUS | 0 refills | Status: CP
Start: 2016-12-28 — End: ?
  Administered 2016-12-28: 12:00:00 100 mL via INTRAVENOUS

## 2016-12-28 MED ORDER — DOXYCYCLINE HYCLATE 100 MG PO TAB
100 mg | Freq: Two times a day (BID) | ORAL | 0 refills | Status: DC
Start: 2016-12-28 — End: 2016-12-28
  Administered 2016-12-28: 16:00:00 100 mg via ORAL

## 2016-12-28 MED ORDER — SODIUM CHLORIDE 0.9 % IJ SOLN
50 mL | Freq: Once | INTRAVENOUS | 0 refills | Status: CP
Start: 2016-12-28 — End: ?
  Administered 2016-12-28: 12:00:00 50 mL via INTRAVENOUS

## 2016-12-28 MED ORDER — DOXYCYCLINE HYCLATE 100 MG PO TAB
100 mg | ORAL_TABLET | Freq: Two times a day (BID) | ORAL | 0 refills | 8.00000 days | Status: AC
Start: 2016-12-28 — End: 2017-01-31

## 2016-12-28 MED ORDER — ATORVASTATIN 40 MG PO TAB
40 mg | ORAL_TABLET | Freq: Every day | ORAL | 3 refills | Status: AC
Start: 2016-12-28 — End: ?

## 2016-12-28 NOTE — Consults
CTS CONSULT  Date of Service: 12/28/2016    Requesting Physician: Dr. Mackey Birchwood  Consulting Physician: Dr. Ky Barban  Consult Performed By: Minus Liberty PA-C        HPI: 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 She endorses fatigue and DOE over the last year, seemingly worsening over the last several months. She experiences DOE with minimal activity and is unable to ambulate around her house without becoming SOA. She wears 2L supplemental O2 at all times and follows with a pulmonologist at [facility] who has diagnosed her with brochiectasis. She has orthopnea, palpitations, and lower extremity edema. She denies chest pain or syncopal episodes but states she becomes lightheaded with ambulation.     Her most recent echo from 08/05/16 revealed an EF of 45%, LBBB, normal diastolic dyfunction with moderately dilated LV, moderate to severe AS with AVA 0.7cm2, mean gradient , and peak velocity of 2.67m/s.     Her cardiac cath today revealed no significant coronary artery disease and her carotid US was without significant stenosis. Her complete PFTs revealed an FEV1 of 61% predicted and a DLCOunc of 87% predicted interpreted as a non-specific ventilatory defect.      Cardiothoracic consultation has been requested to determine if the patient is a surgical candidate for AVR vs. TAVR.    STS RISK:     Procedure: AV Replacement  Risk of Mortality: 3.283%  Morbidity or Mortality: 20.4%  Long Length of Stay: 11.184%  Short Length of Stay: 22.533%  Permanent Stroke: 2.166%  Prolonged Ventilation: 15.667%  DSW Infection: 1.115%  Renal Failure: 5.784%  Reoperation: 7.884%    CATH FINDINGS: Minimal, non-obstructive CAD    PMH:  Past Medical History:   Diagnosis Date   ??? Aortic stenosis    ??? Broncholithiasis    ??? COPD (chronic obstructive pulmonary disease) (HCC) 09/13/2011   ??? GERD (gastroesophageal reflux disease) 09/13/2011   ??? Hypertension 09/13/2011   ??? O2 dependent        PSH:  Past Surgical History:   Procedure Laterality Date   ??? BACK SURGERY     ??? CHOLECYSTECTOMY     ??? HYSTERECTOMY     ??? KNEE REPLACEMENT     ??? TONSILLECTOMY          Medicatons:    albuterol 0.5% (PROVENTIL; VENTOLIN) nebulizer solution 2.5 mg 2.5 mg Inhalation BID & PRN   amitriptyline (ELAVIL) tablet 50 mg 50 mg Oral QHS   aspirin EC tablet 81 mg 81 mg Oral QDAY   atorvastatin (LIPITOR) tablet 40 mg 40 mg Oral QDAY   budesonide respule (PULMICORT) nebulizer solution 0.25 mg 0.25 mg Inhalation BID   carvedilol (COREG) tablet 12.5 mg 12.5 mg Oral BID   doxycycline (VIBRAMYCIN) tablet 100 mg 100 mg Oral BID   dronedarone (MULTAQ) tablet 400 mg 400 mg Oral BID w/meals   ferrous sulfate (FEOSOL, FEROSUL) tablet 325 mg 325 mg Oral BID   lisinopril (PRINIVIL; ZESTRIL) tablet 20 mg 20 mg Oral QDAY   tiotropium (SPIRIVA) capsule for inhaler 1 capsule 1 capsule Inhalation QDAY   warfarin (COUMADIN) tablet 5 mg 5 mg Oral QHS       Allergies:  Allergies   Allergen Reactions   ???  Avelox [Moxifloxacin] UNKNOWN   ??? Ciprofloxacin UNKNOWN   ??? Sulfa (Sulfonamide Antibiotics) UNKNOWN       Family History:  Family History   Problem Relation Age of Onset   ??? Cancer Mother        Social History:  Social History     Social History   ??? Marital status: Divorced     Spouse name: N/A   ??? Number of children: N/A   ??? Years of education: N/A     Social History Main Topics   ??? Smoking status: Former Smoker     Packs/day: 3.00     Years: 35.00     Types: Cigarettes     Quit date: 03/07/1994   ??? Smokeless tobacco: Never Used   ??? Alcohol use No   ??? Drug use: No   ??? Sexual activity: Not on file     Other Topics Concern   ??? Not on file     Social History Narrative ??? No narrative on file       ROS:  Constitutional: Positive for fatigue, Negative for Weight Change, Fevers  Eyes, Ears, Nose And Throat: Negative for Change in vision, Change in Hearing   Cardiovascular: Positive for DOE, palpitations, lower extremity edema, Negative for Chest Pain  Respiratory: Positive for SOA  Gastrointestinal: Negative for Nausea, indigestion, Diarrhea, Constipation  Neurological: Negative for Headache, Memory problems, Numbness, Muscle Weakness  Psychological: Negative for depression or anxiety  Musculoskeletal: Positive for back pain  Genitourinary: Negative for Pain with urination, Incontinence of urine, urinary frequency    Skin: Negative for any unusual rash  Endocrine: Negative for Any hair skin or nail changes, Unusual hunger or thirst    Physical Exam:  Temp: 36.7 ???C (98 ???F) (10/24 0422)  Pulse: 80 (10/24 0908)  BP: 99/57 (10/24 0419)      GENERAL:   A&O x 3, NAD.  Obese  HEENT  Head:  Normocephalic   Teeth: Present and in good dentition  NECK  Active ROM: full  Trachea: midline  HEART  Neck Veins- No JVD   Carotid Arteries:  No bruits  Cardiac: faint heart tones, no murmur appreciated  LUNGS  Auscultation- breath sounds course throughout  Patient on supplemental oxygen  ABDOMEN  Soft, NT + BS  EXTREMITIES  Edema- 1+ bilate LE edema  Posterior Tibial- 2+ bil   Dorsalis Pedis- 2+ bil  SKIN:   Normal, without lesions  NEUROLOGIC  A&O x 3  Grossly intact     Results for orders placed or performed during the hospital encounter of 12/27/16 (from the past 24 hour(s))   POC PT/INR    Collection Time: 12/27/16 12:05 PM   # # Low-High    INR POC 1.2 0.8 - 1.2   CBC    Collection Time: 12/28/16  4:20 AM   # # Low-High    White Blood Cells 6.7 4.5 - 11.0 K/UL    RBC 3.58 (L) 4.0 - 5.0 M/UL    Hemoglobin 10.6 (L) 12.0 - 15.0 GM/DL    Hematocrit 16.1 (L) 36 - 45 %    MCV 93.4 80 - 100 FL    MCH 29.5 26 - 34 PG    MCHC 31.6 (L) 32.0 - 36.0 G/DL    RDW 09.6 (H) 11 - 15 % Platelet Count 177 150 - 400 K/UL    MPV 8.6 7 - 11 FL   BASIC METABOLIC PANEL    Collection Time: 12/28/16  4:20  AM   # # Low-High    Sodium 139 137 - 147 MMOL/L    Potassium 4.3 3.5 - 5.1 MMOL/L    Chloride 105 98 - 110 MMOL/L    CO2 27 21 - 30 MMOL/L    Anion Gap 7 3 - 12    Glucose 113 (H) 70 - 100 MG/DL    Blood Urea Nitrogen 17 7 - 25 MG/DL    Creatinine 1.61 0.4 - 1.00 MG/DL    Calcium 9.1 8.5 - 09.6 MG/DL    eGFR Non African American >60 >60 mL/min    eGFR African American >60 >60 mL/min   LIPID PROFILE    Collection Time: 12/28/16  4:20 AM   # # Low-High    Cholesterol 156 <200 MG/DL    Triglycerides 045 <409 MG/DL    HDL 38 (L) >81 MG/DL    LDL 191 (H) <478 MG/DL    VLDL 25 MG/DL    Non HDL Cholesterol 118 MG/DL   PROTIME INR (PT)    Collection Time: 12/28/16  4:20 AM   # # Low-High    INR 1.3 (H) 0.8 - 1.2        Impression:  Active Hospital Problems    Diagnosis   ??? Nonrheumatic aortic valve stenosis   ??? Chronic respiratory failure with hypoxia, on home oxygen therapy (HCC)     2L NC     ??? Bronchiectasis (HCC)   ??? 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 Atrial flutter (HCC)   ??? Obesity, Class III, BMI 40-49.9 (morbid obesity) (HCC)   ??? LBBB (left bundle branch block)   ??? A-fib [facility])     Diagnosed June 2013     ??? COPD (chronic obstructive pulmonary disease) (HCC)   ??? Hypertension   ??? GERD (gastroesophageal reflux disease)   ??? Cerebral aneurysm     0ct 1999 Ruptured cerebral aneurysm which was closed with a coil          Plan:  Patient will be discussed with Dr. Ky Barban who will review testing and determine surgical candidacy. Further recommendations to follow. May need further information from home pulmonologist regarding baseline respiratory function.     Corky Downs, PA-C  [phone]

## 2016-12-29 ENCOUNTER — Encounter: Admit: 2016-12-29 | Discharge: 2016-12-29 | Payer: MEDICARE

## 2016-12-29 DIAGNOSIS — R9389 Abnormal findings on diagnostic imaging of other specified body structures: Principal | ICD-10-CM

## 2016-12-29 DIAGNOSIS — R0600 Dyspnea, unspecified: ICD-10-CM

## 2016-12-29 DIAGNOSIS — R918 Other nonspecific abnormal finding of lung field: ICD-10-CM

## 2016-12-29 DIAGNOSIS — I350 Nonrheumatic aortic (valve) stenosis: Principal | ICD-10-CM

## 2016-12-29 NOTE — Telephone Encounter
-----   Message from Jearld Lesch, APRN-NP sent at 12/28/2016  4:00 PM CDT -----  TAVR w/u discharged today. CT scan concerning, pulmonary etiology and dyspnea. #1 needs pulm consult asap.  #2 TEE to look at AV, questionable restriction.  If positive, would like dobutamine echo pre and post. Dr. Mackey Birchwood would prefer to do when he is in the Echo lab; if not, as early as possible. Thanks!

## 2017-01-04 ENCOUNTER — Encounter: Admit: 2017-01-04 | Discharge: 2017-01-04 | Payer: MEDICARE

## 2017-01-04 NOTE — Telephone Encounter
Called and confirmed TEE scheduled 01/05/2017 at 0900. Instructed pt to arrive at [place] at 0830, NPO after MN, take a.m. meds with sip of water excluding lasix and vitamins, and to have a driver present. Pt verbalized understanding and denied further questions.

## 2017-01-05 ENCOUNTER — Ambulatory Visit: Admit: 2017-01-05 | Discharge: 2017-01-05 | Payer: MEDICARE

## 2017-01-05 ENCOUNTER — Encounter: Admit: 2017-01-05 | Discharge: 2017-01-05 | Payer: MEDICARE

## 2017-01-05 DIAGNOSIS — I350 Nonrheumatic aortic (valve) stenosis: ICD-10-CM

## 2017-01-05 DIAGNOSIS — K219 Gastro-esophageal reflux disease without esophagitis: ICD-10-CM

## 2017-01-05 DIAGNOSIS — R0600 Dyspnea, unspecified: ICD-10-CM

## 2017-01-05 DIAGNOSIS — J449 Chronic obstructive pulmonary disease, unspecified: Principal | ICD-10-CM

## 2017-01-05 DIAGNOSIS — I10 Essential (primary) hypertension: ICD-10-CM

## 2017-01-05 DIAGNOSIS — Z9981 Dependence on supplemental oxygen: ICD-10-CM

## 2017-01-05 DIAGNOSIS — J9809 Other diseases of bronchus, not elsewhere classified: ICD-10-CM

## 2017-01-05 LAB — POC PT/INR: Lab: 2 — ABNORMAL HIGH (ref 0.8–1.2)

## 2017-01-05 MED ORDER — PHENYLEPHRINE IN 0.9% NACL(PF) 1 MG/10 ML (100 MCG/ML) IV SYRG
0 refills | Status: DC
Start: 2017-01-05 — End: 2017-01-05
  Administered 2017-01-05: 15:00:00 100 ug via INTRAVENOUS

## 2017-01-05 MED ORDER — PROPOFOL 10 MG/ML IV EMUL 20 ML (INFUSION)(AM)(OR)
INTRAVENOUS | 0 refills | Status: DC
Start: 2017-01-05 — End: 2017-01-05
  Administered 2017-01-05: 15:00:00 30000 ug via INTRAVENOUS

## 2017-01-05 MED ORDER — LIDOCAINE (PF) 200 MG/10 ML (2 %) IJ SYRG
0 refills | Status: DC
Start: 2017-01-05 — End: 2017-01-05
  Administered 2017-01-05: 15:00:00 50 mg via INTRAVENOUS

## 2017-01-05 MED ORDER — KETAMINE 10 MG/ML IJ SOLN
0 refills | Status: DC
Start: 2017-01-05 — End: 2017-01-05
  Administered 2017-01-05: 15:00:00 20 mg via INTRAVENOUS

## 2017-01-05 MED ORDER — SODIUM CHLORIDE 0.9 % IV SOLP (OR) 500ML
0 refills | Status: DC
Start: 2017-01-05 — End: 2017-01-05
  Administered 2017-01-05: 15:00:00 via INTRAVENOUS

## 2017-01-05 NOTE — Anesthesia Post-Procedure Evaluation
Post-Anesthesia Evaluation    Name: Michelle Solis      MRN: [id number]     DOB: [birth year]     Age: 76 y.o.     Sex: female   __________________________________________________________________________     Procedure Date: 01/05/2017  Procedure: TEE      Surgeon: Dr. Mackey Birchwood    Post-Anesthesia Vitals  BP: 115/68 (11/01 1035)  Pulse: 67 (11/01 1035)  Respirations: 15 PER MINUTE (11/01 1035)  SpO2: 91 % (11/01 1035)  O2 Delivery: None (Room Air) (11/01 1035)  Height: 165.1 cm (65") (11/01 1017)      Post Anesthesia Evaluation Note    Evaluation location: Pre/Post  Patient participation: recovered; patient participated in evaluation  Level of consciousness: alert  Pain management: adequate    Hydration: normovolemia  Temperature: 36.0C - 38.4C  Airway patency: adequate    Perioperative Events  Perioperative events:  no       Post-op nausea and vomiting: no PONV    Postoperative Status  Cardiovascular status: hemodynamically stable  Respiratory status: spontaneous ventilation  Follow-up needed: none        Perioperative Events  Perioperative Event: No  Emergency Case Activation: No

## 2017-01-05 NOTE — Anesthesia Pre-Procedure Evaluation
Anesthesia Pre-Procedure Evaluation    Name: Michelle Solis      MRN: [id number]     DOB: Jan 08, 1941     Age: 76 y.o.     Sex: female   __________________________________________________________________________     Procedure Date: 01/05/2017   Procedure: * No procedures listed *     Physical Assessment  Vital Signs (last filed in past 24 hours):  BP: 130/81 (11/01 0839)  Pulse: 72 (11/01 0839)  Respirations: 24 PER MINUTE (11/01 0839)  O2 Delivery: Nasal Cannula (11/01 0839)  Height: 165.1 cm (65) (11/01 0835)  Weight: 126 kg (277 lb 12.5 oz) (11/01 4540)      Patient History  Allergies   Allergen Reactions   ??? Avelox [Moxifloxacin] UNKNOWN   ??? Ciprofloxacin UNKNOWN   ??? Sulfa (Sulfonamide Antibiotics) UNKNOWN        Current Medications    Medication Directions   acetaminophen (TYLENOL) 325 mg tablet Take 2 Tabs by mouth every 4 hours as needed.  Patient taking differently: Take 650 mg by mouth as Needed.   albuterol-ipratropium (DUO-NEB, DUO-VENT) 0.5 mg-3 mg(2.5 mg base)/3 mL nebulizer solution Inhale 3 mL solution by nebulizer as directed four times daily.   amitriptyline (ELAVIL) 50 mg tablet Take 50 mg by mouth at bedtime daily.   aspirin EC 81 mg tablet Take 1 Tab by mouth daily.   atorvastatin (LIPITOR) 40 mg tablet Take one tablet by mouth daily.   budesonide respule (PULMICORT) 0.25 mg/2 mL nebulizer solution Inhale 0.25 mg solution by nebulizer as directed twice daily.   CALCIUM CARBONATE/VITAMIN D3 (CALCIUM + D PO) Take 600 mg by mouth daily.   carvedilol (COREG) 12.5 mg tablet TAKE ONE TABLET BY MOUTH TWICE DAILY WITH MEALS TAKE  WITH  FOOD.   doxycycline (VIBRAMYCIN) 100 mg tablet Take one tablet by mouth twice daily.   dronedarone (MULTAQ) 400 mg tablet Take one tablet by mouth twice daily with meals.   ferrous sulfate 325 mg (65 mg iron) tablet Take 325 mg by mouth twice daily.   furosemide (LASIX) 40 mg tablet TAKE ONE TABLET BY MOUTH ONCE DAILY IN THE MORNING lisinopril (PRINIVIL; ZESTRIL) 20 mg tablet Take 20 mg by mouth daily.   multivit-min-FA-Ca carb-vit K 400 mcg-500 mg calcium-20 mcg Tab Take  by mouth daily.   omeprazole DR(+) (PRILOSEC) 40 mg capsule Take 40 mg by mouth daily.   tiotropium bromide (SPRIVA RESPIMAT) 1.25 mcg/actuation inhaler Inhale 2 puffs by mouth into the lungs daily.   warfarin (COUMADIN) 2 mg tablet Take 2 mg by mouth. 4 times week as directed by INR          Review of Systems/Medical History      Patient summary reviewed  Pertinent labs reviewed    PONV Screening: Female gender and Non-smoker        Pulmonary           COPD, moderate       Shortness of breath      Home oxygen use (chronic resp failure with hypoxia)      Cardiovascular           Hypertension, well controlled        Valvular problems/murmurs (severe aortic stenosis): AS      Coronary artery disease        Dysrhythmias (LBBB); atrial fibrillation      Orthopnea      Dyspnea on exertion      08/05/2016  Interpretation Summary  Rest Echo:   Overall left ventricular systolic function is reduced. EF~ 45%  Abnormal septal motion  Calcific aortic valve stenosis: AV area ~ 0.7 cm2  No significant pericardial effusion  Normal chamber dimensions  Mild LVH  Normal diastolic function  Normal aortic root dimensions  ???  Echo appears similar to that of 2017: MPG across AV is the same but valve area calculates less. EF is slightly decreased          GI/Hepatic/Renal           GERD, well controlled      No nausea      No vomiting      Neuro/Psych         Cerebral Aneurysm s/p coil 1999      Musculoskeletal         Fractures (proximal femur)      Endocrine/Other         Anemia      Obesity   Physical Exam    Airway Findings      Mallampati: II      TM distance: >3 FB      Neck ROM: full      Mouth opening: good      Airway patency: adequate    Dental Findings:             Cardiovascular Findings:         Other findings: murmur    Abdominal Findings:       Obese Neurological Findings: Negative         Diagnostic Tests  Hematology:   Lab Results   Component Value Date    HGB 10.6 12/28/2016    HCT 33.5 12/28/2016    PLTCT 177 12/28/2016    WBC 6.7 12/28/2016    NEUT 79 06/16/2013    ANC 9.50 06/16/2013    ALC 1.10 06/16/2013    MONA 11 06/16/2013    AMC 1.30 06/16/2013    EOSA 1 06/16/2013    ABC 0.00 06/16/2013    MCV 93.4 12/28/2016    MCH 29.5 12/28/2016    MCHC 31.6 12/28/2016    MPV 8.6 12/28/2016    RDW 17.4 12/28/2016         General Chemistry:   Lab Results   Component Value Date    NA 139 12/28/2016    K 4.3 12/28/2016    CL 105 12/28/2016    CO2 27 12/28/2016    GAP 7 12/28/2016    BUN 17 12/28/2016    CR 0.89 12/28/2016    GLU 113 12/28/2016    CA 9.1 12/28/2016    ALBUMIN 3.4 06/06/2016    LACTIC 2.3 01/26/2016    MG 2.0 06/14/2013    TOTBILI 0.40 06/06/2016    PO4 4.3 06/14/2013      Coagulation:   Lab Results   Component Value Date    PTT 34.3 06/14/2013    INR 2.0 01/05/2017    INR 1.3 12/28/2016         Anesthesia Plan    ASA score: 3   Plan: MAC  Induction method: intravenous  NPO status: acceptable      Informed Consent  Anesthetic plan and risks discussed with patient.  Use of blood products discussed with patient  Blood Consent: consented

## 2017-01-05 NOTE — Progress Notes
Pre-Operative Assessment for TEE or Cardioversion    Date of Service:  01/05/2017  Michelle Solis is a 76 y.o. y.o. female.     DOB: [birth year]                  MRN#:  [id number]      Procedure to be performed: TEE  Expected Procedure Date:  01/05/2017  Indication: Aortic Stenosis     Patient appears alert and oriented: Yes  NPO: for greater than 8 hours  Inpatient IV status: None    Isolation status: None  Last TEE date: None  Last Cardioversion date: None      Anticoagulation Results   Anticoagulant: warfarin  Missed dose: No    Last MAC INR Flow Sheet Entry:    Last recorded Lab results:   INR   Date Value Ref Range Status   12/28/2016 1.3 (H) 0.8 - 1.2 Final   07/09/2013 1.2 0.8 - 1.2 Final   06/14/2013 1.9 (H) 0.8 - 1.2 Final     INR POC   Date Value Ref Range Status   12/27/2016 1.2 0.8 - 1.2 Final     APTT   Date Value Ref Range Status   06/14/2013 34.3 22.0 - 37.0 SEC Final           Allergies                                        Allergies   Allergen Reactions   ??? Avelox [Moxifloxacin] UNKNOWN   ??? Ciprofloxacin UNKNOWN   ??? Sulfa (Sulfonamide Antibiotics) UNKNOWN          Vitals  Estimated body mass index is 46.22 kg/m??? as calculated from the following:    Height as of this encounter: 1.651 m (5' 5).    Weight as of this encounter: 126 kg (277 lb 12.5 oz).      Diagnostic Tests  White Blood Cells   Date Value Ref Range Status   12/28/2016 6.7 4.5 - 11.0 K/UL Final     Hemoglobin   Date Value Ref Range Status   12/28/2016 10.6 (L) 12.0 - 15.0 GM/DL Final     Hematocrit   Date Value Ref Range Status   12/28/2016 33.5 (L) 36 - 45 % Final     Platelet Count   Date Value Ref Range Status   12/28/2016 177 150 - 400 K/UL Final     Sodium   Date Value Ref Range Status   12/28/2016 139 137 - 147 MMOL/L Final     Potassium   Date Value Ref Range Status   12/28/2016 4.3 3.5 - 5.1 MMOL/L Final     Magnesium   Date Value Ref Range Status   06/14/2013 2.0 1.6 - 2.6 MG/DL Final     Blood Urea Nitrogen Date Value Ref Range Status   12/28/2016 17 7 - 25 MG/DL Final     Creatinine   Date Value Ref Range Status   12/28/2016 0.89 0.4 - 1.00 MG/DL Final     Glucose   Date Value Ref Range Status   12/28/2016 113 (H) 70 - 100 MG/DL Final       Blood Cultures  Resulted Micro Last 72 Hrs    No results found             Echo procedures within the past 30 days:  No  results found.        Device Information on File  No results found for: GENERATOR, EPDEVTYP      Current Medications  Current Outpatient Prescriptions on File Prior to Encounter   Medication Sig Dispense Refill   ??? acetaminophen (TYLENOL) 325 mg tablet Take 2 Tabs by mouth every 4 hours as needed. (Patient taking differently: Take 650 mg by mouth as Needed.) 30 Tab 1   ??? albuterol-ipratropium (DUO-NEB, DUO-VENT) 0.5 mg-3 mg(2.5 mg base)/3 mL nebulizer solution Inhale 3 mL solution by nebulizer as directed four times daily.     ??? amitriptyline (ELAVIL) 50 mg tablet Take 50 mg by mouth at bedtime daily.     ??? aspirin EC 81 mg tablet Take 1 Tab by mouth daily. 90 Tab 1   ??? atorvastatin (LIPITOR) 40 mg tablet Take one tablet by mouth daily. 90 tablet 3   ??? budesonide respule (PULMICORT) 0.25 mg/2 mL nebulizer solution Inhale 0.25 mg solution by nebulizer as directed twice daily.     ??? CALCIUM CARBONATE/VITAMIN D3 (CALCIUM + D PO) Take 600 mg by mouth daily.     ??? carvedilol (COREG) 12.5 mg tablet TAKE ONE TABLET BY MOUTH TWICE DAILY WITH MEALS TAKE  WITH  FOOD. 180 tablet 2   ??? doxycycline (VIBRAMYCIN) 100 mg tablet Take one tablet by mouth twice daily. 14 tablet 0   ??? dronedarone (MULTAQ) 400 mg tablet Take one tablet by mouth twice daily with meals. 60 tablet 11   ??? ferrous sulfate 325 mg (65 mg iron) tablet Take 325 mg by mouth twice daily.     ??? furosemide (LASIX) 40 mg tablet TAKE ONE TABLET BY MOUTH ONCE DAILY IN THE MORNING 90 tablet 3   ??? lisinopril (PRINIVIL; ZESTRIL) 20 mg tablet Take 20 mg by mouth daily. ??? multivit-min-FA-Ca carb-vit K 400 mcg-500 mg calcium-20 mcg Tab Take  by mouth daily.     ??? omeprazole DR(+) (PRILOSEC) 40 mg capsule Take 40 mg by mouth daily.     ??? tiotropium bromide (SPRIVA RESPIMAT) 1.25 mcg/actuation inhaler Inhale 2 puffs by mouth into the lungs daily.     ??? warfarin (COUMADIN) 2 mg tablet Take 2 mg by mouth. 4 times week as directed by INR        No current facility-administered medications on file prior to encounter.          Past Medical History  Past Medical History:   Diagnosis Date   ??? Aortic stenosis    ??? Broncholithiasis    ??? COPD (chronic obstructive pulmonary disease) (HCC) 09/13/2011   ??? GERD (gastroesophageal reflux disease) 09/13/2011   ??? Hypertension 09/13/2011   ??? O2 dependent          Past Surgical History  Past Surgical History:   Procedure Laterality Date   ??? BACK SURGERY     ??? CHOLECYSTECTOMY     ??? HYSTERECTOMY     ??? KNEE REPLACEMENT     ??? TONSILLECTOMY           Social History     Social History   Substance Use Topics   ??? Smoking status: Former Smoker     Packs/day: 3.00     Years: 35.00     Types: Cigarettes     Quit date: 03/07/1994   ??? Smokeless tobacco: Never Used   ??? Alcohol use No         Additional Comments:  None      Probe Assessment (  only for TEE procedures):   Positive for: Swallowing difficulty and GERD      Sedation Assessment:   Positive for: COPD / Asthma and O2

## 2017-01-05 NOTE — Progress Notes
Care Plan   Care Category & Patient Outcome Goal Met Treatment/  Interventions Plan of the Day RN Name   Cardiovascular  Hemodynamic stability and adequate peripheral perfusion.   Yes   Refer to  patient's chart. Monitor VS per sedation standard. Monitor ECG continuously.  Assess/maintain IV patency.   Rashida Ladouceur, RN   Respiratory  Patent airway, ease of respiration, and adequate oxygenation.   Yes   Refer to  patient's chart. Maintain open airway. Assess respirations. Monitor O2 saturations. Titrate O2 to keep sat>= 95% or baseline.   Kingsly Kloepfer, RN   Psychological/Emotional/Spiritual  Cope with procedure with support in place.  Spiritual needs are addressed.     Yes   Refer to  patient's chart. Provide adequate and thorough instructions.  Provide a caring and supportive environment.  Communicate patients concerns with other members of the health team.   Emarie Paul, RN   Pain  Patients pain goal met.   Yes   Refer to  patient's chart. Prepare patient for potentially uncomfortable procedure.  Observe for verbal/nonverbal complaints of pain.  Assess pain.  Provide comfort measures.   Demorris Choyce, RN   Safety/Fall Risk  Free from injury, security maintained.   Yes High Risk    Refer to  patient's chart.   Follow nursing standard of practice for high risks fall patients.   Kaizen Ibsen, RN   Knowledge Base  Verbalize understanding of procedure/information provided.   Yes   Refer to  patient's chart. Describe the procedure along with what symptoms to expect.  Evaluate patients understanding of procedure.  Encourage patient to ask questions.  Provide additional information as needed.   Khayla Koppenhaver, RN

## 2017-01-05 NOTE — Progress Notes
Peripheral IV Insertion Note:  Patient Side: right  Line Orientation:Forearm  IV Catheter Size: 20G  Number of Attempts:1.  IV capped and flushed with Normal Saline.  IV site without redness, swelling, or pain.  New dressing placed.    After procedure IV cannula removed intact and hemostasis achieved.

## 2017-01-13 ENCOUNTER — Encounter: Admit: 2017-01-13 | Discharge: 2017-01-13 | Payer: MEDICARE

## 2017-01-13 NOTE — Telephone Encounter
Patient has been scheduled for surgery with Dr. Ky BarbanMuehlebach. Patient instructed on holding medications prior to surgery, see letter on this date for additional information. Patient states she has extra Lovenox syringes on hand after her heart cath procedure, and does not need any additional syringes called in.

## 2017-01-17 ENCOUNTER — Encounter: Admit: 2017-01-17 | Discharge: 2017-01-17 | Payer: MEDICARE

## 2017-01-17 NOTE — Progress Notes
TAVR Evaluation    Date of initial eval: 12/12/16  Name: Michelle Solis  DOB: 04-24-1940  MRN: [id number]  Primary provider: Lona Kettle  Referring cardiologist: Kateri Mc     Initial TAVR eval byThea Silversmith  Second surgeon: TZ 11/27    Assessment & Plan:   Severe symptomatic aortic stenosis.  Low gradient on TTE but appears severe on TEE.  Due to age, frailty and co-morbidities, including lung disease with O2 dependency, she is intermediate risk for surgical valve replacement.  TAVR work up complete and results reviewed.  TAVR scheduled 11/28 with Dr. Ky Barban and Dr. Mackey Birchwood.      Norman Clay, APRN-C      Patient to go to [facility]? No     Step-down Exclusion criteria  Frail/limited mobility   No  LVEF < 40%    No  PASP > 50 mm Hg   No  Mitral Valve Regurgitation > 2+  No  Severe Lung Disease   No  Creatinine clearance <40mL/min No  Uncontrolled Hypertension  No  BMI <20 or >50    Yes  STS risk score > 10   No    Pertinent Medical History:  COPD. atrial flutter. morbid obesity, LBBB, cerebral aneurysm rupture and hypertension    BMI: 50.12 kg/m???  Allergies:   Allergies   Allergen Reactions   ??? Avelox [Moxifloxacin] UNKNOWN   ??? Ciprofloxacin UNKNOWN   ??? Sulfa (Sulfonamide Antibiotics) UNKNOWN     Anticoagulation: Warfarin      STS: 3.283%  Frailty: Pending    Creat cl: 81.346    EKG: Atrial flutter/Tachy with 2:1    Echo:   Overall left ventricular systolic function is reduced. EF~ 45%  Abnormal septal motion  Calcific aortic valve stenosis: AV area ~ 0.7 cm2  No significant pericardial effusion  Normal chamber dimensions  Mild LVH  Normal diastolic function  Normal aortic root dimensions    TEE:  Aortic valve area: 0.90 cm2  Aortic valve mean gradient: 31 mmHg          Aortic valve peak gradient: 43.16 mmHg   Aortic valve peak velocity: 3.5 m/s Aortic valve velocity ratio: 0.31     Cath:   1. 20% mid RCA disease.  Otherwise no obstructive coronary artery disease. 2. Hemostasis achieved with TR Band to the right radial artery access site.    Carotid duplex:   1. No significant stenosis in B/L common and internal carotid arteries.  2. Mild plaque in both carotid arteries.  3. Normal antegrade flow in bilateral vertebral arteries.  4. No significant stenosis in bilateral proximal subclavian arteries.  5. Delayed systolic upstroke waveforms noted throughout the recording consistent with patient's known aortic valve stenosis.    PFTs: Abnormalities demonstrate a non-specific ventilatory defect. This may be due to body habitus. Clinical correlation is recommended.  FEV1: 1.37 (61%)  DLCO: 17.93 (87%)    CTA report:   CHEST:  1. ???Mild thickening and calcification of the aortic valve with a normal   caliber thoracic aorta.  2. ???Mild cardiomegaly with calcific coronary artery disease.  3. ???Patchy groundglass and alveolar opacities are scattered throughout   both lungs. ???Leading consideration is an atypical pneumonia  4. ???Mild mediastinal and hilar lymphadenopathy that is most likely   reactive ???    ABDOMEN AND PELVIS:  1. Normal caliber abdominal aorta and common iliac arteries with mild   atherosclerosis  2. ???Moderate distal colonic diverticulosis with mild stranding in the  adjacent fat and trace ascites suggestive of mild acute diverticulitis.    report:very high bifurcation on right, prefer left CFA. Very small sinuses with low left coronary. Plan for 26 Evolut Pro.    Annular size: 21.4

## 2017-01-19 ENCOUNTER — Encounter: Admit: 2017-01-19 | Discharge: 2017-01-19 | Payer: MEDICARE

## 2017-01-19 DIAGNOSIS — I10 Essential (primary) hypertension: ICD-10-CM

## 2017-01-19 DIAGNOSIS — J9809 Other diseases of bronchus, not elsewhere classified: ICD-10-CM

## 2017-01-19 DIAGNOSIS — I350 Nonrheumatic aortic (valve) stenosis: ICD-10-CM

## 2017-01-19 DIAGNOSIS — K219 Gastro-esophageal reflux disease without esophagitis: ICD-10-CM

## 2017-01-19 DIAGNOSIS — R0600 Dyspnea, unspecified: ICD-10-CM

## 2017-01-19 DIAGNOSIS — R9389 Abnormal findings on diagnostic imaging of other specified body structures: ICD-10-CM

## 2017-01-19 DIAGNOSIS — Z9981 Dependence on supplemental oxygen: ICD-10-CM

## 2017-01-19 DIAGNOSIS — J449 Chronic obstructive pulmonary disease, unspecified: Principal | ICD-10-CM

## 2017-01-22 ENCOUNTER — Encounter: Admit: 2017-01-22 | Discharge: 2017-01-22 | Payer: MEDICARE

## 2017-01-22 DIAGNOSIS — I350 Nonrheumatic aortic (valve) stenosis: Principal | ICD-10-CM

## 2017-01-23 ENCOUNTER — Encounter: Admit: 2017-01-23 | Discharge: 2017-01-23 | Payer: MEDICARE

## 2017-01-23 ENCOUNTER — Ambulatory Visit: Admit: 2017-01-23 | Discharge: 2017-01-23 | Payer: MEDICARE

## 2017-01-23 DIAGNOSIS — Z9981 Dependence on supplemental oxygen: ICD-10-CM

## 2017-01-23 DIAGNOSIS — R918 Other nonspecific abnormal finding of lung field: ICD-10-CM

## 2017-01-23 DIAGNOSIS — I10 Essential (primary) hypertension: ICD-10-CM

## 2017-01-23 DIAGNOSIS — I350 Nonrheumatic aortic (valve) stenosis: ICD-10-CM

## 2017-01-23 DIAGNOSIS — R0600 Dyspnea, unspecified: ICD-10-CM

## 2017-01-23 DIAGNOSIS — R9389 Abnormal findings on diagnostic imaging of other specified body structures: ICD-10-CM

## 2017-01-23 DIAGNOSIS — J449 Chronic obstructive pulmonary disease, unspecified: Principal | ICD-10-CM

## 2017-01-23 DIAGNOSIS — K219 Gastro-esophageal reflux disease without esophagitis: ICD-10-CM

## 2017-01-23 DIAGNOSIS — J9809 Other diseases of bronchus, not elsewhere classified: ICD-10-CM

## 2017-01-23 NOTE — Progress Notes
Date of Service: 01/23/2017    Subjective:             Michelle Solis is a 76 y.o. female.    History of Present Illness    Michelle Solis is a 76 year old woman with extensive past medical history as indicated below including coiling for a cerebral aneurysm in 1998, atrial flutter and and fibrillation since 5 years for which she is on warfarin as well as obesity.  She has been diagnosed with COPD in the past and was started on Pulmicort as well as Spiriva in addition to DuoNeb about 6 months ago without clinical improvement.  She was started on home oxygen in 2013 at the time during the night only and was then a while later found with low hemoglobin and received blood transfusions after which she did not use home oxygen anymore.  About a year ago she started again feeling more short of breath started using her oxygen is now over the last half year with increasing shortness of breath and borderline oxygenation without the home oxygen.  Recently she was found with a severe aortic stenosis for which she is scheduled to undergo TAVR on February 01, 2017 as her 2D echo showed some mild dilatation of the left ventricle with mildly decreased left ventricular function and she becomes more more symptomatic.  She has been hospitalized for pneumonia about a month ago when she was treated with [facility] for a week with clinical improvement.  But she is not quite back at her usual state.  During that hospitalization a CT angiogram of her chest was done and did not find any pulmonary embolism but patchy groundglass changes and peri-bronchial cuffing without significant bronchiectasis.    Today the patient comes for evaluation of her surgical risk from a pulmonary perspective.  The patient is clearly impaired by her current respiratory status as she is not able to walk even small distances without getting very winded even if she is using her oxygen.  She brought herself portable pulse oximeter and finds that she is usually sitting in a chair without oxygen above 89% and with the oxygen between 93 and 95%.  She does not complain of significant chest pain.  Besides her shortness of breath she has intermittent cough with small amount of phlegm which is mainly clear.  At home she used to water bottle in the oxygen sick with to humidify the air but as this is causing a lot of condensation fluid in the 50 feet to and often feels up with both her and becomes not practical.  Based on that she is using no humidification at this time and complains of dry nose and mouth with intermittent small amount of epistaxis.  She says she lost some weight over the last month but remains with good appetite.  No fever chills or night sweats since she has been hospitalized for her pneumonia.  Besides her respiratory issues she has considerable back pain which is impairing her mobility.  She reports intermittent heartburn for which omeprazole helps.  Also constipation for which she uses MiraLAX.  She used to do water gymnastics which she is currently not able to do based on her overall status.     Past Medical History:   Diagnosis Date   ??? Abnormal chest CT    ??? Aortic stenosis    ??? Atrial flutter (HCC)    ??? Cerebral aneurysm rupture (HCC) 1998   ??? Dyspnea    ??? GERD (gastroesophageal reflux disease)  09/13/2011   ??? Hypertension 09/13/2011   ??? O2 dependent      Past Surgical History:   Procedure Laterality Date   ??? BACK SURGERY     ??? CHOLECYSTECTOMY     ??? HX HEART CATHETERIZATION     ??? HYSTERECTOMY     ??? KNEE REPLACEMENT     ??? TONSILLECTOMY       Family History   Problem Relation Age of Onset   ??? Cancer Mother         pancreas   ??? Suicide Father    ??? COPD Sister    ??? Diabetes Daughter      Social History     Social History   ??? Marital status: Divorced     Spouse name: N/A   ??? Number of children: N/A   ??? Years of education: N/A     Occupational History   ??? worked as Merchandiser, retail in [employer]    ??? retired since 1998 Social History Main Topics   ??? Smoking status: Former Smoker     Packs/day: 3.00     Years: 35.00     Types: Cigarettes     Quit date: 03/07/1994   ??? Smokeless tobacco: Never Used   ??? Alcohol use No   ??? Drug use: No   ??? Sexual activity: Not on file     Other Topics Concern   ??? Not on file     Social History Narrative   ??? No narrative on file          Review of Systems   Constitutional: Positive for activity change and fatigue. Negative for appetite change, chills and fever.        Lost a few lbs over the last weeks   HENT: Positive for congestion and postnasal drip. Negative for drooling, hearing loss, rhinorrhea, sinus pressure, sore throat, trouble swallowing and voice change.         Intermittent epistaxis   Eyes: Negative for pain, redness and itching.        Tearing eyes, probably related to underlying dry eyes   Respiratory: Positive for cough and shortness of breath. Negative for apnea, chest tightness, wheezing and stridor.    Cardiovascular: Negative for chest pain, palpitations and leg swelling.   Gastrointestinal: Positive for constipation. Negative for abdominal distention, abdominal pain, diarrhea, nausea and vomiting.   Endocrine: Negative.    Genitourinary: Negative for dysuria and flank pain.   Musculoskeletal: Positive for arthralgias, back pain, gait problem and joint swelling. Negative for myalgias.   Skin: Negative.  Negative for color change, pallor, rash and wound.   Allergic/Immunologic: Negative for environmental allergies, food allergies and immunocompromised state.   Neurological: Negative for dizziness, tremors, seizures, syncope, weakness and light-headedness.   Hematological: Negative.  Negative for adenopathy. Does not bruise/bleed easily.   Psychiatric/Behavioral: Negative for agitation and confusion. The patient is not nervous/anxious and is not hyperactive.         Reports mild impairment of fine motorics since cerebral aneurysm          Objective: ??? acetaminophen (TYLENOL) 325 mg tablet Take 2 Tabs by mouth every 4 hours as needed. (Patient taking differently: Take 650 mg by mouth as Needed.)   ??? albuterol-ipratropium (DUO-NEB, DUO-VENT) 0.5 mg-3 mg(2.5 mg base)/3 mL nebulizer solution Inhale 3 mL solution by nebulizer as directed four times daily.   ??? amitriptyline (ELAVIL) 50 mg tablet Take 50 mg by mouth at bedtime daily.   ??? aspirin  EC 81 mg tablet Take 1 Tab by mouth daily.   ??? atorvastatin (LIPITOR) 40 mg tablet Take one tablet by mouth daily.   ??? budesonide respule (PULMICORT) 0.25 mg/2 mL nebulizer solution Inhale 0.25 mg solution by nebulizer as directed twice daily.   ??? CALCIUM CARBONATE/VITAMIN D3 (CALCIUM + D PO) Take 600 mg by mouth daily.   ??? carvedilol (COREG) 12.5 mg tablet TAKE ONE TABLET BY MOUTH TWICE DAILY WITH MEALS TAKE  WITH  FOOD.   ??? doxycycline (VIBRAMYCIN) 100 mg tablet Take one tablet by mouth twice daily.   ??? dronedarone (MULTAQ) 400 mg tablet Take one tablet by mouth twice daily with meals.   ??? ferrous sulfate 325 mg (65 mg iron) tablet Take 325 mg by mouth twice daily.   ??? furosemide (LASIX) 40 mg tablet TAKE ONE TABLET BY MOUTH ONCE DAILY IN THE MORNING   ??? lisinopril (PRINIVIL; ZESTRIL) 20 mg tablet Take 20 mg by mouth daily.   ??? multivit-min-FA-Ca carb-vit K 400 mcg-500 mg calcium-20 mcg Tab Take  by mouth daily.   ??? omeprazole DR(+) (PRILOSEC) 40 mg capsule Take 40 mg by mouth daily.   ??? tiotropium bromide (SPRIVA RESPIMAT) 1.25 mcg/actuation inhaler Inhale 2 puffs by mouth into the lungs daily.   ??? warfarin (COUMADIN) 2 mg tablet Take 2 mg by mouth. 4 times week as directed by INR      Vitals:    01/23/17 1048   BP: 159/85   Pulse: 99   Resp: 18   Temp: 36.6 ???C (97.9 ???F)   TempSrc: Oral   SpO2: (!) 91%   Weight: 128.8 kg (284 lb)   Height: 165.1 cm (65)     Body mass index is 47.26 kg/m???.     Physical Exam   Constitutional: She is oriented to person, place, and time. She does not appear ill. No distress.   Obese Decreased saturation   HENT:   Head: Normocephalic.   Right Ear: Hearing, tympanic membrane, external ear and ear canal normal.   Left Ear: Hearing, tympanic membrane, external ear and ear canal normal.   Nose: Right sinus exhibits no maxillary sinus tenderness and no frontal sinus tenderness. Left sinus exhibits no maxillary sinus tenderness and no frontal sinus tenderness.   Mouth/Throat: Uvula is midline. No oropharyngeal exudate.   Dry mucosa with several bloody areas   Eyes: Conjunctivae and lids are normal. Right eye exhibits no discharge. Left eye exhibits no discharge. No scleral icterus.   Neck: No tracheal deviation present. No thyromegaly present.   Cardiovascular: Normal rate.  Exam reveals no gallop and no friction rub.    Murmur heard.  4/6 low frequency systolic murmer over aorta   Pulmonary/Chest: Effort normal and breath sounds normal. No accessory muscle usage or stridor. No respiratory distress. She has no wheezes. She has no rales.   Abdominal: Soft. Bowel sounds are normal. She exhibits no distension. There is no tenderness.   Musculoskeletal: She exhibits no edema or tenderness.   Lymphadenopathy:        Head (right side): No submental and no submandibular adenopathy present.        Head (left side): No submental and no submandibular adenopathy present.     She has no cervical adenopathy.        Right cervical: No superficial cervical, no deep cervical and no posterior cervical adenopathy present.       Left cervical: No superficial cervical, no deep cervical and no posterior cervical adenopathy  present.   Neurological: She is alert and oriented to person, place, and time. No cranial nerve deficit.   Skin: Skin is warm. She is not diaphoretic.   Psychiatric: Her speech is normal and behavior is normal. Judgment and thought content normal. Cognition and memory are normal.       PFT from 12/28/16 shows a FEV1/FVC of 90%, a FEV1 of 1.37l (61% pred) and a FVC of 1.54l (51% pred). TLC was 4.8l (92% pred) and RV 2.97l (127% pred). DLco was 87% pred and DLcoHgb 97% pred. DLcoVA 150% pred    CTA chest from 12/28/16 shows on own review patchy ground glass and alveolar opacities that are scattered throughout both lungs. Mild mediastinal and hilar lymphadenopathy. No signs of bronchiectasis.     2DE 08/05/16 shows moderately dilated LV with mlidly decreased LVEF at 45%. Normal size and wall thickness, but reduced EF of the right ventricle.     TEE from 01/05/17 shows a severe aortic stenosis with a diameter of 0.9cm2    Assessment and Plan:    I have reviewed recent PFT, CT Scan, Echo results and labs from the patient    Hypoxia without signs of hypercarbia in BMP  On home oxygen started 2013 with intermittent tapering off, but increased used since about half a year  By PFT and radiologically no signs of COPD, but patient is on Spiriva/Pulmicort/Duoneb since about 6 months without clinical improvment  Increased DLco indicating possible CHF    CT 12/28/16 with patchy ground glass opacities and peribronchial cuffing  Clinically no signs of pulmonary infection  Possibly related to pulmonary edema from severe AS  Increased DLco possibly related to mild left heart failure  DD: intertsitiial lung disease as patient reports mild dryness of eyes and mouth  Recommend to repeat CT scan a few months after TAVR and f/u in clinic after that    Severe aortic valve stenosis in TEE 01/05/17  Mild dilatation LV in 2DE from 08/05/16  Scheduled for TAVR on 02/01/17    Atrial fibrillation/flutter since 2013  On warfarin and multaq  Currently rate controlled    Obesity with BMI of 47kg/m2  No symptoms of OSAS    HTN  On lisinopril and carvediol  Today rather high pressure  Patient may need optimization of therapy, if today pressures are regularly that high    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  As her CT changes are most likely related to CHF from her severe aortic stenosis and she is able to oxygenate well with an FiO2 of 24% and does not seem to have hypercarbic respiratory failure based on her recent BMP and clinical history I do think that the benefits of the procedures in the context of a fairly rapidly deteriorating aortic stenosis with mild dilatation of the left ventricle are outweighing the risks of the planned procedure despite her fairly low exercise performance of the patient at this time.  Major pulmonary risks are respiratory failure after the procedure with possible difficulties of weaning ventilatory support and postsurgical pulmonary infections based on her obese body habitus.  To decrease her risks I recommend perisurgical intensive incentive spirometry, even though the patient will not undergo planned abdominal or thoracal incisions.  I recommend to try to keep a mildly negative fluid balance, if possible from a hemodynamic perspective, to reduced assumed pulmonary edema. Consider non-invasive ventlaltion postsurgical, as patient is at risk for impaired ventilation and oxygenation based on her obese body habitus.

## 2017-01-24 ENCOUNTER — Encounter: Admit: 2017-01-24 | Discharge: 2017-01-24 | Payer: MEDICARE

## 2017-01-24 DIAGNOSIS — I350 Nonrheumatic aortic (valve) stenosis: ICD-10-CM

## 2017-01-24 DIAGNOSIS — I607 Nontraumatic subarachnoid hemorrhage from unspecified intracranial artery: ICD-10-CM

## 2017-01-24 DIAGNOSIS — R0600 Dyspnea, unspecified: ICD-10-CM

## 2017-01-24 DIAGNOSIS — Z8679 Personal history of other diseases of the circulatory system: ICD-10-CM

## 2017-01-24 DIAGNOSIS — I480 Paroxysmal atrial fibrillation: ICD-10-CM

## 2017-01-24 DIAGNOSIS — Z9981 Dependence on supplemental oxygen: ICD-10-CM

## 2017-01-24 DIAGNOSIS — I4892 Unspecified atrial flutter: ICD-10-CM

## 2017-01-24 DIAGNOSIS — K219 Gastro-esophageal reflux disease without esophagitis: ICD-10-CM

## 2017-01-24 DIAGNOSIS — I10 Essential (primary) hypertension: Principal | ICD-10-CM

## 2017-01-24 DIAGNOSIS — R9389 Abnormal findings on diagnostic imaging of other specified body structures: ICD-10-CM

## 2017-01-25 ENCOUNTER — Encounter: Admit: 2017-01-25 | Discharge: 2017-01-25 | Payer: MEDICARE

## 2017-01-25 DIAGNOSIS — I350 Nonrheumatic aortic (valve) stenosis: Principal | ICD-10-CM

## 2017-01-25 MED ORDER — LIDOCAINE (PF) 10 MG/ML (1 %) IJ SOLN
.1-2 mL | INTRAMUSCULAR | 0 refills | Status: CN | PRN
Start: 2017-01-25 — End: ?

## 2017-01-25 MED ORDER — SODIUM CHLORIDE 0.9 % IV SOLP
INTRAVENOUS | 0 refills | Status: CN
Start: 2017-01-25 — End: ?

## 2017-01-25 NOTE — Telephone Encounter
Fax sent to Theodoro Gristave at [facility] with referral to assist patient with installing humidifier on home oxygen concentrator.  Referral included demographics, insurance, office note.   Richardson DoppNicole Avir Deruiter, MA    Routing to Christophe Louisourtney Kuhl, Charity fundraiserN.

## 2017-01-31 ENCOUNTER — Ambulatory Visit: Admit: 2017-01-31 | Discharge: 2017-01-31 | Payer: MEDICARE

## 2017-01-31 ENCOUNTER — Encounter: Admit: 2017-01-31 | Discharge: 2017-01-31 | Payer: MEDICARE

## 2017-01-31 ENCOUNTER — Ambulatory Visit: Admit: 2017-01-31 | Discharge: 2017-02-01 | Payer: MEDICARE

## 2017-01-31 ENCOUNTER — Inpatient Hospital Stay: Admit: 2017-01-31 | Discharge: 2017-01-31 | Payer: MEDICARE

## 2017-01-31 DIAGNOSIS — Z8679 Personal history of other diseases of the circulatory system: ICD-10-CM

## 2017-01-31 DIAGNOSIS — I10 Essential (primary) hypertension: Principal | ICD-10-CM

## 2017-01-31 DIAGNOSIS — R9389 Abnormal findings on diagnostic imaging of other specified body structures: ICD-10-CM

## 2017-01-31 DIAGNOSIS — R0600 Dyspnea, unspecified: ICD-10-CM

## 2017-01-31 DIAGNOSIS — I4892 Unspecified atrial flutter: ICD-10-CM

## 2017-01-31 DIAGNOSIS — R569 Unspecified convulsions: ICD-10-CM

## 2017-01-31 DIAGNOSIS — Z9981 Dependence on supplemental oxygen: ICD-10-CM

## 2017-01-31 DIAGNOSIS — I607 Nontraumatic subarachnoid hemorrhage from unspecified intracranial artery: ICD-10-CM

## 2017-01-31 DIAGNOSIS — J449 Chronic obstructive pulmonary disease, unspecified: ICD-10-CM

## 2017-01-31 DIAGNOSIS — K219 Gastro-esophageal reflux disease without esophagitis: ICD-10-CM

## 2017-01-31 DIAGNOSIS — I350 Nonrheumatic aortic (valve) stenosis: ICD-10-CM

## 2017-01-31 LAB — COMPREHENSIVE METABOLIC PANEL
Lab: 0.5 mg/dL (ref 0.3–1.2)
Lab: 0.9 mg/dL — ABNORMAL HIGH (ref 0.4–1.00)
Lab: 12 U/L (ref 7–56)
Lab: 140 MMOL/L — ABNORMAL LOW (ref 137–147)
Lab: 175 mg/dL — ABNORMAL HIGH (ref 70–100)
Lab: 18 U/L (ref 7–40)
Lab: 29 MMOL/L (ref 21–30)
Lab: 3.4 MMOL/L — ABNORMAL LOW (ref 3.5–5.1)
Lab: 3.6 g/dL (ref 3.5–5.0)
Lab: 56 mL/min — ABNORMAL LOW (ref >60)
Lab: 6.7 g/dL (ref 6.0–8.0)
Lab: 72 U/L (ref 25–110)
Lab: >60 mL/min (ref >60)
Lab: 8 (ref 3–12)

## 2017-01-31 LAB — CBC
Lab: 3.2 M/UL — ABNORMAL LOW (ref 4.0–5.0)
Lab: 9.3 10*3/uL (ref 4.5–11.0)

## 2017-01-31 LAB — URINALYSIS MICROSCOPIC REFLEX TO CULTURE

## 2017-01-31 LAB — URINALYSIS DIPSTICK REFLEX TO CULTURE
Lab: NEGATIVE
Lab: NEGATIVE
Lab: POSITIVE — AB

## 2017-01-31 LAB — PTT (APTT): Lab: 42 s — ABNORMAL HIGH (ref 20.0–36.0)

## 2017-01-31 LAB — HEMOGLOBIN A1C: Lab: 6 % (ref 4.0–6.0)

## 2017-01-31 LAB — PROTIME INR (PT): Lab: 1.4 mg/dL — ABNORMAL HIGH (ref 0.8–1.2)

## 2017-01-31 LAB — BNP (B-TYPE NATRIURETIC PEPTI): Lab: 100 pg/mL (ref 0–100)

## 2017-01-31 NOTE — Progress Notes
Reviewed pre op instructions with patient for her TAVR 11/28.  Instructions included arrival time (0545)/location, NPO at 2300, CHG 4 % shower x 2, and plan of care.  [facility] verbalized understanding [facility]

## 2017-01-31 NOTE — Progress Notes
Date of Service: 01/31/2017       Subjective:             Michelle Solis is a 76 y.o. female.      History of Present Illness  Michelle Solis is a 76 y/o F presenting today for evaluation for TAVR.  She was seen while hospitalized following her cardiac catheterization by Michelle Solis, and today's visit is a second evaluation with Michelle Solis.  She has had a known aortic murmur for several years which has been followed by cardiology.  She has noticed over the last year that she has been short of breath, and was initially told she had COPD by her pulmonologist.  Lately her shortness of breath has been worsening significantly, to the point that she requires oxygen for any activity.  She underwent an echocardiogram in June which showed moderate to severe aortic stenosis, and as her symptoms continued to progress her workup as a possible TAVR candidate began.  Cardiac catheterization was performed on 12/27/16 which showed no significant obstructive disease.  During her hospitalization she was evaluated by Michelle Solis, who was concerned that her symptoms were somewhat disproportional to her degree of stenosis.  Subsequently she was reevaluated by pulmonology, who felt that based on results of her PFTs and her CT chest her symptoms were more likely related to her aortic stenosis than underlying pulmonary disease.  As such she presents today for her preoperative workup.      On interview she becomes visibly short of breath during long conversation.  She is unable to do more than basic ADLs due to shortness of breath and back pain due to arthritis, and is quite concerned about her ability to tolerate lying flat because of these.  She has not attempted to exercise since April, when she tried water aerobics but became winded simply entering the pool.  She is morbidly obese with a BMI of 47.         Review of Systems   Constitution: Negative.   HENT: Negative.    Eyes: Negative. Cardiovascular: Positive for dyspnea on exertion and leg swelling.   Respiratory: Positive for shortness of breath.    Endocrine: Negative.    Hematologic/Lymphatic: Negative.    Skin: Negative.    Musculoskeletal: Negative.    Gastrointestinal: Negative.    Genitourinary: Negative.    Neurological: Negative.    Psychiatric/Behavioral: Negative.    Allergic/Immunologic: Negative.          Objective:         ??? acetaminophen (TYLENOL) 325 mg tablet Take 2 Tabs by mouth every 4 hours as needed. (Patient taking differently: Take 650 mg by mouth as Needed.)   ??? albuterol-ipratropium (DUO-NEB, DUO-VENT) 0.5 mg-3 mg(2.5 mg base)/3 mL nebulizer solution Inhale 3 mL solution by nebulizer as directed four times daily.   ??? amitriptyline (ELAVIL) 50 mg tablet Take 50 mg by mouth at bedtime daily.   ??? aspirin EC 81 mg tablet Take 1 Tab by mouth daily.   ??? atorvastatin (LIPITOR) 40 mg tablet Take one tablet by mouth daily.   ??? budesonide respule (PULMICORT) 0.25 mg/2 mL nebulizer solution Inhale 0.25 mg solution by nebulizer as directed twice daily.   ??? CALCIUM CARBONATE/VITAMIN D3 (CALCIUM + D PO) Take 600 mg by mouth daily.   ??? carvedilol (COREG) 12.5 mg tablet TAKE ONE TABLET BY MOUTH TWICE DAILY WITH MEALS TAKE  WITH  FOOD.   ??? dronedarone (MULTAQ) 400 mg tablet Take one tablet  by mouth twice daily with meals.   ??? enoxaparin (LOVENOX) 100 mg syrg Inject 1 mg/kg under the skin twice daily.   ??? ferrous sulfate 325 mg (65 mg iron) tablet Take 325 mg by mouth twice daily.   ??? furosemide (LASIX) 40 mg tablet TAKE ONE TABLET BY MOUTH ONCE DAILY IN THE MORNING   ??? lisinopril (PRINIVIL; ZESTRIL) 20 mg tablet Take 20 mg by mouth daily.   ??? multivit-min-FA-Ca carb-vit K 400 mcg-500 mg calcium-20 mcg Tab Take  by mouth daily.   ??? omeprazole DR(+) (PRILOSEC) 40 mg capsule Take 40 mg by mouth daily.   ??? tiotropium bromide (SPRIVA RESPIMAT) 1.25 mcg/actuation inhaler Inhale 2 puffs by mouth into the lungs daily.     Vitals: 01/31/17 1026   BP: 110/60   Pulse: 81   SpO2: (!) 91%   Weight: 128.4 kg (283 lb)   Height: 1.651 m (5' 5)     Body mass index is 47.09 kg/m???.     Physical Exam   Constitutional: She is oriented to person, place, and time. She appears well-developed and well-nourished.   HENT:   Head: Normocephalic and atraumatic.   Eyes: Pupils are equal, round, and reactive to light. EOM are normal.   Neck: Normal range of motion.   Cardiovascular: Normal rate and regular rhythm.    III/VI systolic murmur present   Pulmonary/Chest: Effort normal and breath sounds normal.   Visibly short of breath during conversation.   Abdominal: Soft. Bowel sounds are normal. She exhibits no distension.   Obese   Neurological: She is alert and oriented to person, place, and time.   Skin: Skin is warm and dry.   Psychiatric: She has a normal mood and affect. Her behavior is normal. Judgment normal.            Assessment and Plan:  I had the pleasure of seeing Michelle Solis for ongoing evaluation of her aortic stenosis.  She has previously seen my partner Michelle Solis and is scheduled for aortic valve replacement tomorrow.  She is a very pleasant 76 year old female with past medical history notable for A. 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  She has a long-standing history of fatigue and exercise intolerance and in fact cannot recall the last time she could walk across the room without shortness of breath.  She abandoned attempts at rehab 2 years ago due to chronic back pain and shortness of breath.  She briefly entertained water aerobics in April of this year but also abandoned attempts at that time due to shortness of breath.    On exam she does have a 2/6 systolic murmur heard best at the left sternal border.  She is morbidly obese.  She has 2+ radial and femoral pulses bilaterally.  She is not on oxygen currently but does get winded even with conversation.    I reviewed the risks and benefits of the procedure at some length with her.  Risks included bleeding, infection, stroke, dialysis and death.  We also discussed a 10% incidence of needing a pacemaker after this procedure.  We specifically discussed the risk of needing to convert to an open procedure emergently.    We also spent a good bit of time about realistic expectations.  She does have aortic stenosis with a valve area 0.7  cm???.  Her mean gradient is in the 20s.  Her peak velocity is 3.8.  While she has aortic stenosis she clearly has other significant medical issues that impact her exercise tolerance and dyspnea.  She will need extensive rehab after this procedure.  Potentially even requiring skilled nursing facility.  She is almost nonambulatory and is reliant on a walker currently.  While she will get some benefit from getting aortic valve replaced she is profoundly debilitated and morbidly obese.    She also has some anatomy which makes her procedure high risk.  She has an extremely small annulus and small sinuses with a low-lying left coronary.  She is at risk for left coronary obstruction due to these factors.  She is also at high risk given her size and her small annulus for ongoing PPM after the procedure even with a fully functional valve.  This was discussed with her as well.    I appreciate the opportunity to spend the care of this nice lady.  Her TAVR is scheduled for tomorrow morning.    Sindy Messing, M.D.

## 2017-01-31 NOTE — Progress Notes
Dr. Ky BarbanMuehlebach aware of pt's pre operative UA results and patient's first attempt to give UA had fecal matter.  Plan to only give patient ancef per protocol and follow patient's urine culture results in house.    Patient did deny uti s/s during her pre operative visit.

## 2017-01-31 NOTE — Progress Notes
PAC Beta Blocker Instructions Note:    [facility]  As part of the visit, an accurate medication list was obtained and the patient was given pre-op medication instructions for upcoming surgery on 02/01/17.      Michelle Solis is currently taking a beta blocker/Carvedilol.  The patient was instructed to continue their beta blocker as prescribed and to take it on the day of surgery.  The patient verbalized understanding.      Judeth CornfieldStephanie Lexander Tremblay, [place]

## 2017-01-31 NOTE — Telephone Encounter
[facility] who stated that looking at orders in their system, the order was put as order for an O2 concentrator and cancelled. Theodoro GristDave apologized for the confusion and stated he will contact [place] location for them to assist patient ASAP.   Richardson DoppNicole Charline Hoskinson, MA    Routing to Shearon StallsAmanda Ortiz-Cadena, Charity fundraiserN.

## 2017-02-01 ENCOUNTER — Encounter: Admit: 2017-02-01 | Discharge: 2017-02-01 | Payer: MEDICARE

## 2017-02-01 ENCOUNTER — Inpatient Hospital Stay: Admit: 2017-02-01 | Discharge: 2017-02-01 | Payer: MEDICARE

## 2017-02-01 DIAGNOSIS — R569 Unspecified convulsions: ICD-10-CM

## 2017-02-01 DIAGNOSIS — I10 Essential (primary) hypertension: Principal | ICD-10-CM

## 2017-02-01 DIAGNOSIS — I350 Nonrheumatic aortic (valve) stenosis: ICD-10-CM

## 2017-02-01 DIAGNOSIS — K219 Gastro-esophageal reflux disease without esophagitis: ICD-10-CM

## 2017-02-01 DIAGNOSIS — I607 Nontraumatic subarachnoid hemorrhage from unspecified intracranial artery: ICD-10-CM

## 2017-02-01 DIAGNOSIS — Z9981 Dependence on supplemental oxygen: ICD-10-CM

## 2017-02-01 DIAGNOSIS — R9389 Abnormal findings on diagnostic imaging of other specified body structures: ICD-10-CM

## 2017-02-01 DIAGNOSIS — J449 Chronic obstructive pulmonary disease, unspecified: ICD-10-CM

## 2017-02-01 DIAGNOSIS — I352 Nonrheumatic aortic (valve) stenosis with insufficiency: Principal | ICD-10-CM

## 2017-02-01 DIAGNOSIS — Z8679 Personal history of other diseases of the circulatory system: ICD-10-CM

## 2017-02-01 DIAGNOSIS — I4892 Unspecified atrial flutter: ICD-10-CM

## 2017-02-01 DIAGNOSIS — R0600 Dyspnea, unspecified: ICD-10-CM

## 2017-02-01 LAB — POC SODIUM: Lab: 140 MMOL/L — ABNORMAL LOW (ref 137–147)

## 2017-02-01 LAB — POC ACTIVATED CLOTTING TIME
Lab: 271 s
Lab: 313 s

## 2017-02-01 LAB — BASIC METABOLIC PANEL
Lab: 0.8 mg/dL (ref 0.4–1.00)
Lab: 13 mg/dL — ABNORMAL HIGH (ref 7–25)
Lab: 139 MMOL/L — ABNORMAL LOW (ref 137–147)
Lab: 167 mg/dL — ABNORMAL HIGH (ref 70–100)
Lab: 26 MMOL/L (ref 21–30)
Lab: 8 pg (ref 3–12)
Lab: 9.1 mg/dL (ref 8.5–10.6)
Lab: >60 mL/min (ref >60)
Lab: >60 mL/min (ref >60)

## 2017-02-01 LAB — POC IONIZED CALCIUM: Lab: 1.1 MMOL/L (ref 1.0–1.3)

## 2017-02-01 LAB — POC BLOOD GAS ARTERIAL
Lab: 4 MMOL/L
Lab: 40 mmHg (ref 35–45)
Lab: 7.4 — ABNORMAL HIGH (ref 7.35–7.45)

## 2017-02-01 LAB — POC HEMATOCRIT
Lab: 27 % — ABNORMAL LOW (ref 36–45)
Lab: 9.2 g/dL — ABNORMAL LOW (ref 12.0–15.0)

## 2017-02-01 LAB — PROTIME INR (PT): Lab: 1.3 MMOL/L — ABNORMAL HIGH (ref 0.8–1.2)

## 2017-02-01 LAB — MAGNESIUM: Lab: 2.1 mg/dL (ref 1.6–2.6)

## 2017-02-01 LAB — PTT (APTT): Lab: 27 s — ABNORMAL LOW (ref 20.0–36.0)

## 2017-02-01 LAB — POC GLUCOSE
Lab: 128 mg/dL — ABNORMAL HIGH (ref 70–100)
Lab: 170 mg/dL — ABNORMAL HIGH (ref 70–100)

## 2017-02-01 LAB — CBC: Lab: 8.3 10*3/uL (ref 4.5–11.0)

## 2017-02-01 LAB — POC POTASSIUM: Lab: 3.2 MMOL/L — ABNORMAL LOW (ref 3.5–5.1)

## 2017-02-01 MED ORDER — DEXTRAN 70-HYPROMELLOSE (PF) 0.1-0.3 % OP DPET
0 refills | Status: DC
Start: 2017-02-01 — End: 2017-02-01
  Administered 2017-02-01: 14:00:00 2 [drp] via OPHTHALMIC

## 2017-02-01 MED ORDER — OXYCODONE 5 MG PO TAB
5-10 mg | ORAL | 0 refills | Status: DC | PRN
Start: 2017-02-01 — End: 2017-02-01
  Administered 2017-02-01: 17:00:00 5 mg via ORAL

## 2017-02-01 MED ORDER — IPRATROPIUM-ALBUTEROL 0.5 MG-3 MG(2.5 MG BASE)/3 ML IN NEBU
3 mL | RESPIRATORY_TRACT | 0 refills | Status: DC | PRN
Start: 2017-02-01 — End: 2017-02-01

## 2017-02-01 MED ORDER — INSULIN ASPART 100 UNIT/ML SC FLEXPEN
0-14 [IU] | Freq: Before meals | SUBCUTANEOUS | 0 refills | Status: DC
Start: 2017-02-01 — End: 2017-02-03
  Administered 2017-02-01: 23:00:00 2 [IU] via SUBCUTANEOUS

## 2017-02-01 MED ORDER — PROPOFOL INJ 10 MG/ML IV VIAL
0 refills | Status: DC
Start: 2017-02-01 — End: 2017-02-01

## 2017-02-01 MED ORDER — SODIUM CHLORIDE 0.9 % IV SOLP
INTRAVENOUS | 0 refills | Status: DC
Start: 2017-02-01 — End: 2017-02-01

## 2017-02-01 MED ORDER — ONDANSETRON HCL (PF) 4 MG/2 ML IJ SOLN
4 mg | INTRAVENOUS | 0 refills | Status: DC | PRN
Start: 2017-02-01 — End: 2017-02-03

## 2017-02-01 MED ORDER — ELECTROLYTE-A IV SOLP
0 refills | Status: DC
Start: 2017-02-01 — End: 2017-02-01
  Administered 2017-02-01: 14:00:00 via INTRAVENOUS

## 2017-02-01 MED ORDER — SODIUM CHLORIDE 0.9 % IR SOLN
0 refills | Status: DC
Start: 2017-02-01 — End: 2017-02-01
  Administered 2017-02-01: 15:00:00 1000 mL

## 2017-02-01 MED ORDER — AMITRIPTYLINE 50 MG PO TAB
50 mg | Freq: Every evening | ORAL | 0 refills | Status: DC
Start: 2017-02-01 — End: 2017-02-03
  Administered 2017-02-02 – 2017-02-03 (×2): 50 mg via ORAL

## 2017-02-01 MED ORDER — PANTOPRAZOLE 40 MG PO TBEC
80 mg | Freq: Every day | ORAL | 0 refills | Status: DC
Start: 2017-02-01 — End: 2017-02-03
  Administered 2017-02-03: 02:00:00 80 mg via ORAL

## 2017-02-01 MED ORDER — NOREPINEPHRINE IV DRIP (STD CONC)
0 refills | Status: DC
Start: 2017-02-01 — End: 2017-02-01
  Administered 2017-02-01 (×2): .04 ug/kg/min via INTRAVENOUS

## 2017-02-01 MED ORDER — FENTANYL CITRATE (PF) 50 MCG/ML IJ SOLN
25-50 ug | INTRAVENOUS | 0 refills | Status: DC | PRN
Start: 2017-02-01 — End: 2017-02-02
  Administered 2017-02-01 (×2): 50 ug via INTRAVENOUS

## 2017-02-01 MED ORDER — IPRATROPIUM BROMIDE 0.02 % IN SOLN
0.5 mg | RESPIRATORY_TRACT | 0 refills | Status: DC | PRN
Start: 2017-02-01 — End: 2017-02-01

## 2017-02-01 MED ORDER — IPRATROPIUM BROMIDE 0.02 % IN SOLN
0.5 mg | RESPIRATORY_TRACT | 0 refills | Status: DC | PRN
Start: 2017-02-01 — End: 2017-02-02

## 2017-02-01 MED ORDER — PROCHLORPERAZINE 25 MG RE SUPP
25 mg | RECTAL | 0 refills | Status: DC | PRN
Start: 2017-02-01 — End: 2017-02-01

## 2017-02-01 MED ORDER — DEXAMETHASONE SODIUM PHOSPHATE 4 MG/ML IJ SOLN
INTRAVENOUS | 0 refills | Status: DC
Start: 2017-02-01 — End: 2017-02-01
  Administered 2017-02-01: 14:00:00 4 mg via INTRAVENOUS

## 2017-02-01 MED ORDER — SODIUM CHLORIDE 0.9 % IV SOLP
INTRAVENOUS | 0 refills | Status: DC
Start: 2017-02-01 — End: 2017-02-02
  Administered 2017-02-02: 13:00:00 1000.000 mL via INTRAVENOUS

## 2017-02-01 MED ORDER — CEFAZOLIN 1 GRAM IJ SOLR
0 refills | Status: DC
Start: 2017-02-01 — End: 2017-02-01
  Administered 2017-02-01: 14:00:00 3 g via INTRAVENOUS

## 2017-02-01 MED ORDER — POTASSIUM CHLORIDE 20 MEQ PO TBTQ
20-40 meq | ORAL | 0 refills | Status: DC | PRN
Start: 2017-02-01 — End: 2017-02-03
  Administered 2017-02-01 – 2017-02-02 (×2): 40 meq via ORAL
  Administered 2017-02-02: 05:00:00 20 meq via ORAL

## 2017-02-01 MED ORDER — FUROSEMIDE 40 MG PO TAB
40 mg | Freq: Every morning | ORAL | 0 refills | Status: DC
Start: 2017-02-01 — End: 2017-02-03

## 2017-02-01 MED ORDER — HEPARIN (PORCINE) 1,000 UNIT/ML IJ SOLN
0 refills | Status: DC
Start: 2017-02-01 — End: 2017-02-01
  Administered 2017-02-01: 15:00:00 1000 [IU] via INTRAVENOUS
  Administered 2017-02-01: 15:00:00 8000 [IU] via INTRAVENOUS

## 2017-02-01 MED ORDER — MAGNESIUM HYDROXIDE 2,400 MG/10 ML PO SUSP
10 mL | Freq: Every day | ORAL | 0 refills | Status: DC | PRN
Start: 2017-02-01 — End: 2017-02-03

## 2017-02-01 MED ORDER — LIDOCAINE (PF) 200 MG/10 ML (2 %) IJ SYRG
0 refills | Status: DC
Start: 2017-02-01 — End: 2017-02-01
  Administered 2017-02-01: 14:00:00 100 mg via INTRAVENOUS

## 2017-02-01 MED ORDER — FENTANYL CITRATE (PF) 50 MCG/ML IJ SOLN
0 refills | Status: DC
Start: 2017-02-01 — End: 2017-02-01
  Administered 2017-02-01: 14:00:00 100 ug via INTRAVENOUS

## 2017-02-01 MED ORDER — SUGAMMADEX 100 MG/ML IV SOLN
INTRAVENOUS | 0 refills | Status: DC
Start: 2017-02-01 — End: 2017-02-01
  Administered 2017-02-01: 15:00:00 250 mg via INTRAVENOUS

## 2017-02-01 MED ORDER — BUDESONIDE 0.25 MG/2 ML IN NBSP
.25 mg | Freq: Two times a day (BID) | RESPIRATORY_TRACT | 0 refills | Status: DC
Start: 2017-02-01 — End: 2017-02-03
  Administered 2017-02-01 – 2017-02-03 (×5): 0.25 mg via RESPIRATORY_TRACT

## 2017-02-01 MED ORDER — HYDRALAZINE 20 MG/ML IJ SOLN
10 mg | INTRAVENOUS | 0 refills | Status: DC | PRN
Start: 2017-02-01 — End: 2017-02-03

## 2017-02-01 MED ORDER — OXYCODONE 5 MG PO TAB
5 mg | ORAL | 0 refills | Status: DC | PRN
Start: 2017-02-01 — End: 2017-02-03

## 2017-02-01 MED ORDER — CEFAZOLIN INJ 1GM IVP
2 g | INTRAVENOUS | 0 refills | Status: CP
Start: 2017-02-01 — End: ?
  Administered 2017-02-01 – 2017-02-02 (×2): 2 g via INTRAVENOUS

## 2017-02-01 MED ORDER — ALBUTEROL SULFATE 2.5 MG/0.5 ML IN NEBU
2.5 mg | RESPIRATORY_TRACT | 0 refills | Status: DC | PRN
Start: 2017-02-01 — End: 2017-02-02
  Administered 2017-02-02: 12:00:00 2.5 mg via RESPIRATORY_TRACT

## 2017-02-01 MED ORDER — POTASSIUM CHLORIDE 20 MEQ/15 ML PO LIQD
20-40 meq | NASOGASTRIC | 0 refills | Status: DC | PRN
Start: 2017-02-01 — End: 2017-02-03

## 2017-02-01 MED ORDER — SENNOSIDES-DOCUSATE SODIUM 8.6-50 MG PO TAB
2 | Freq: Two times a day (BID) | ORAL | 0 refills | Status: DC
Start: 2017-02-01 — End: 2017-02-03
  Administered 2017-02-02 – 2017-02-03 (×2): 2 via ORAL

## 2017-02-01 MED ORDER — LIDOCAINE (PF) 10 MG/ML (1 %) IJ SOLN
.1-2 mL | INTRAMUSCULAR | 0 refills | Status: DC | PRN
Start: 2017-02-01 — End: 2017-02-01

## 2017-02-01 MED ORDER — TIOTROPIUM BROMIDE 18 MCG IN CPDV
1 | Freq: Every day | RESPIRATORY_TRACT | 0 refills | Status: DC
Start: 2017-02-01 — End: 2017-02-03
  Administered 2017-02-01: 16:00:00 1 via RESPIRATORY_TRACT

## 2017-02-01 MED ORDER — ONDANSETRON HCL (PF) 4 MG/2 ML IJ SOLN
INTRAVENOUS | 0 refills | Status: DC
Start: 2017-02-01 — End: 2017-02-01
  Administered 2017-02-01: 15:00:00 4 mg via INTRAVENOUS

## 2017-02-01 MED ORDER — ACETAMINOPHEN 325 MG PO TAB
650 mg | ORAL | 0 refills | Status: DC | PRN
Start: 2017-02-01 — End: 2017-02-03
  Administered 2017-02-01 – 2017-02-02 (×3): 650 mg via ORAL

## 2017-02-01 MED ORDER — NOREPINEPHRINE IV DRIP (STD CONC)
.01-.4 ug/kg/min | INTRAVENOUS | 0 refills | Status: DC
Start: 2017-02-01 — End: 2017-02-02

## 2017-02-01 MED ORDER — NITROGLYCERIN IN 5 % DEXTROSE 50 MG/250 ML (200 MCG/ML) IV SOLN
.1-3 ug/kg/min | INTRAVENOUS | 0 refills | Status: DC
Start: 2017-02-01 — End: 2017-02-02

## 2017-02-01 MED ORDER — POLYETHYLENE GLYCOL 3350 17 GRAM PO PWPK
1 | Freq: Every day | ORAL | 0 refills | Status: DC
Start: 2017-02-01 — End: 2017-02-03
  Administered 2017-02-02: 15:00:00 17 g via ORAL

## 2017-02-01 MED ORDER — PHENYLEPHRINE IV DRIP (STD CONC)
0 refills | Status: DC
Start: 2017-02-01 — End: 2017-02-01
  Administered 2017-02-01 (×2): 0.4 ug/kg/min via INTRAVENOUS

## 2017-02-01 MED ORDER — PROTAMINE 10 MG/ML IV SOLN
0 refills | Status: DC
Start: 2017-02-01 — End: 2017-02-01
  Administered 2017-02-01: 15:00:00 10 mg via INTRAVENOUS
  Administered 2017-02-01 (×2): 20 mg via INTRAVENOUS

## 2017-02-01 MED ORDER — WARFARIN 2 MG PO TAB
2 mg | Freq: Every evening | ORAL | 0 refills | Status: DC
Start: 2017-02-01 — End: 2017-02-03
  Administered 2017-02-02 – 2017-02-03 (×2): 2 mg via ORAL

## 2017-02-01 MED ORDER — BISACODYL 10 MG RE SUPP
10 mg | Freq: Every day | RECTAL | 0 refills | Status: DC | PRN
Start: 2017-02-01 — End: 2017-02-03

## 2017-02-01 MED ORDER — ATORVASTATIN 40 MG PO TAB
40 mg | Freq: Every day | ORAL | 0 refills | Status: DC
Start: 2017-02-01 — End: 2017-02-03
  Administered 2017-02-01 – 2017-02-03 (×3): 40 mg via ORAL

## 2017-02-01 MED ORDER — ASPIRIN 81 MG PO CHEW
81 mg | Freq: Every day | ORAL | 0 refills | Status: DC
Start: 2017-02-01 — End: 2017-02-03
  Administered 2017-02-01 – 2017-02-03 (×3): 81 mg via ORAL

## 2017-02-01 MED ORDER — ROCURONIUM 10 MG/ML IV SOLN
INTRAVENOUS | 0 refills | Status: DC
Start: 2017-02-01 — End: 2017-02-01
  Administered 2017-02-01: 14:00:00 50 mg via INTRAVENOUS

## 2017-02-01 MED ORDER — FERROUS SULFATE 325 MG (65 MG IRON) PO TAB
325 mg | Freq: Two times a day (BID) | ORAL | 0 refills | Status: DC
Start: 2017-02-01 — End: 2017-02-03
  Administered 2017-02-01 – 2017-02-03 (×5): 325 mg via ORAL

## 2017-02-01 MED ADMIN — LIDOCAINE (PF) 10 MG/ML (1 %) IJ SOLN [95838]: 2 mL | INTRAMUSCULAR | @ 13:00:00 | Stop: 2017-02-01 | NDC 63323049227

## 2017-02-01 MED ADMIN — SODIUM CHLORIDE 0.9 % IV SOLP [27838]: INTRAVENOUS | @ 13:00:00 | Stop: 2017-02-01 | NDC 00338004904

## 2017-02-01 MED ADMIN — IPRATROPIUM BROMIDE 0.02 % IN SOLN [12580]: RESPIRATORY_TRACT | @ 16:00:00 | Stop: 2017-02-01 | NDC 00487980101

## 2017-02-01 MED ADMIN — ALBUTEROL SULFATE 2.5 MG/0.5 ML IN NEBU [93139]: 2.5 mg | RESPIRATORY_TRACT | @ 16:00:00 | Stop: 2017-02-01 | NDC 00487990130

## 2017-02-01 NOTE — Progress Notes
Reviewed, given TEE images and restricted leaflet motion of AV will proceed with TAVR.  Reviewed with pt.  GFM

## 2017-02-01 NOTE — Progress Notes
Pt arrived to CTI 315.  Neuro: oriented X4 MAE FC afebrile  CV: SR on monitor, SBP <140 MAP >65  Pulm: CTA despite excessive coughing and complaints of nasal congestion, Breathing treatments given by RT, 4L NC   GIGU: voided prior to CTI admit, unable to assess urine, no foley present, +BS  TVP 37 back up rate set at 40.  Will continue to monitor.

## 2017-02-01 NOTE — Progress Notes
Patient is doing well. Patient has abnormal UA but was contaminated. We will continue with our routine surgical Ancef and follow cultures while in the hospital. Patient denies any questions or concerns. Labs reviewed. Will proceed with TAVR under the surgical care of Dr. Ky BarbanMuehlebach.    [employer] [facility] [employer] PA-C [phone]

## 2017-02-01 NOTE — Other
Brief Operative Note    Name: Michelle DullLinda G Solis is a 76 y.o. female     DOB: 07/03/1940             MRN#: [id number]  DATE OF OPERATION: 02/01/2017    Date:  02/01/2017        Preoperative Dx:   Aortic valve stenosis, etiology of cardiac valve disease unspecified [I35.0]    Post-op Diagnosis      * Aortic valve stenosis, etiology of cardiac valve disease unspecified [I35.0]    Procedure(s) (LRB):  PERCUTANEOUS TRANSCATHETER REPLACEMENT AORTIC VALVE - EVOLUT 26 PRO, LEFT COMMON FEMORAL ARTERY APPROACH (Bilateral)    Anesthesia Type: Defer to Anesthesia    Surgeon(s) and Role:     * Marcell Barlowadros, Peter N, MD, [facility] - Co-Surgeon     * Giannis Corpuz, Heath GoldGregory F, MD - Primary     * Ky BarbanMuehlebach, Heath GoldGregory F, MD - Primary      Findings:  Gradient 25 mmHg    Antibiotics:  Ancef 2 gms @ 0820    Estimated Blood Loss: No blood loss documented.     Specimen(s) Removed/Disposition: * No specimens in log *    Complications:  None    Implants: 26 Evolut Pro    Drains: None    Disposition:  ICU - stable    Kathryne ErikssonGregory Abhiram Criado, MD  Pager [phone]

## 2017-02-01 NOTE — Progress Notes
Pt assessment unchanged. Off bedrest at 1330. Pt continues to C/O headache and back pain intermittently. Medications given and pain improved.

## 2017-02-01 NOTE — Anesthesia Procedure Notes
Anesthesia Procedure: Transesophageal Echocardiogram    TEE  Date/Time: 02/01/2017 8:20 AM    Associated procedure: AVR  Preprocedure checklist performed: 2 patient identifiers, risks & benefits discussed, patient evaluated, timeout performed, consent obtained and patient being monitored    Staff     Anesthesiologist: Orlan Leavens, Doug Bucklin     Surgeon: Kathryne Eriksson F     Performed personally    Indication for TEE: assessment of ascending aorta, assessment of surgical repair, defect repair evaluation, ventricular function, volume assessment, confirmation of pre-procedure diagnosis and valvular assessment    Physician requesting echo: Kathryne Eriksson F    CPT codes: 60454 - Guidance of a transcatheter intervention(s)    Patient location: OR  Intubated: yes  Bite block: yes  Heart visualized: yes    Insertion: easy  Probe type: multiplane  Modalities: 2D, color flow mapping, continuous wave Doppler, pulse wave Doppler and 3D      Echocardiographic and Doppler Measurements    Ventricular Findings  Right Ventricle     RV cavity size: normal     RV hypertrophy: no     RV thrombus: no     RV global function: normal    Left Ventricle     LV cavity size: normal     LV hypertrophy: yes       LV wall thickness: 1.4 cm     LV thrombus: no     LV global function: normal     LV ejection fraction: 30-40%      Ventricular Wall Motion  Four Chamber View     Basal anterolateral: normal     Basal inferoseptal: normal     Mid anterolateral: hypokinetic     Mid inferoseptal: hypokinetic     Apical lateral: dyskinetic     Apical septal: hypokinetic      Two Chamber View     Basal anterior: normal     Basal inferior: normal     Mid anterior: normal     Mid inferior: hypokinetic     Apical anterior: dyskinetic     Apical inferior: hypokinetic    Long Axis View     Basal anteroseptal: hypokinetic     Basal inferolateral: normal     Mid anteroseptal: hypokinetic     Mid inferolateral: normal     Apical lateral: hypokinetic Apical septal: dyskinetic     Apex: dyskinetic            Valves  Aortic Valve     Annulus: calcified     Stenosis: moderate and severe     Regurgitation severity: none     Leaflet morphology: calcified       Leaflet motion: restricted     Aortic valve comments: Calcified AV with limited excursion    Mitral Valve     Annulus: calcified     Stenosis: none     Mean gradient: 1 mmHg     Regurgitation severity: mild and moderate     Leaflet morphology: normal     Leaflet motion: normal      Tricuspid Valve     Annulus: normal             Stenosis: none       Regurgitation severity: trace       Leaflet morphology: normal     Leaflet motion: normal    Pulmonic Valve     Annulus: normal       Stenosis: none       Regurgitation severity:  none       Leaflet morphology: normal    Aorta   Ascending Aorta      Size: normal    Sinotubular Junction     Size: normal      Sinus of Valsalva      Size: normal        Atria  Right Atrium      Size: normal      Left Atrium      Size: normal     Left atrial appendage size: normal      Septa  Intra-atrial septal morphology: lipomatous hypertrophy    Other Findings  Pericardium: normal    Pleural effusion: none    Pulmonary arteries: normal    Pulmonary venous flow: normal          Post Procedure  Aortic valve replacement       Perivalvular leak: yes       Mean gradient (mmHg): 8    Systolic anterior motion of the mitral valve: no  Return to CBT for echo-related diagnosis: no  Aorta intact after decannulation: yes  Post-procedure LVEF measured: no 30-40%  Post-procedure RV dysfunction: none    Post-procedure comments: S/p TAVR - tr perivalvular leak, valve functions well.    Performed by: Michelle Nasuti  Authorized by: Michelle Nasuti

## 2017-02-01 NOTE — Discharge Instructions - Pharmacy
Physician Discharge Summary    Name: Michelle Solis  Medical Record Number: [id number]        Account Number:  [id number]  Date Of Birth:  1940-08-30                         Age:  76 years   Admit date:  02/01/2017                     Discharge date:  02/03/2017    Attending Physician:  Dr. Kathryne Eriksson             Service: Cardiothor Surg    Physician Summary completed by: Emmit Alexanders, PA-C    Reason for hospitalization: Aortic valve stenosis    Significant PMH:   Past Medical History:   Diagnosis Date   ??? Abnormal chest CT    ??? Aortic stenosis    ??? Atrial flutter (HCC)    ??? Cerebral aneurysm rupture (HCC) 1998   ??? COPD (chronic obstructive pulmonary disease) (HCC)    ??? Dyspnea    ??? GERD (gastroesophageal reflux disease) 09/13/2011   ??? History of rheumatic fever during childhood   ??? Hypertension 09/13/2011   ??? O2 dependent    ??? Seizures (HCC)     off medications since 2000     Allergies: Avelox [moxifloxacin]; Ciprofloxacin; and Sulfa (sulfonamide antibiotics)    Admission Physical Exam notable for:  Aortic Stenosis    Admission Lab/Radiology studies notable for: see Admit H&P    Brief Hospital Course:  The patient was admitted to [facility] on 02[facility]01[facility]17 and underwent  elective transcatheter aortic valve replacement with Dr. Ky Barban.  She was transferred to the intensive care unit for recovery for monitoring of her rhythm and hemodynamics.  A post-operative echocardiogram demonstrated a well seated bioprosthetic valve. Her PTA coumadin was started for her h/o atrial fibrillation. She transferred to the cardiothoracic progressive care unit on post operative day #1. She was noted to have a new LBBB and her home beta blocker was held. A superficial wound vac was placed over her left groin incision.  The patient's overall hospital course was uneventful.  She increased her activity and oral intake. The patient had normal bowel and bladder function.  The patient was stable to be discharged home on post operative day #2. She was discharged with a superficial wound vac over her left groin. She has an appointment which will be changed in our clinic on 02/10/17. Her home lisinopril was held on discharge due to hypotension.     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    COPD (chronic obstructive pulmonary disease) (HCC)    Hypertension    GERD (gastroesophageal reflux disease)    Nonrheumatic aortic valve stenosis    LBBB (left bundle branch block)    Obesity, Class III, BMI 40-49.9 (morbid obesity) (HCC)    Iron deficiency anemia    Severe aortic stenosis    Coronary artery disease involving native coronary artery of native heart without angina pectoris    Chronic respiratory failure with hypoxia, on home oxygen therapy (HCC)    Chronic bilateral thoracic back pain    Chronic anticoagulation  Chronic HFrEF (heart failure with reduced ejection fraction) (HCC)    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 Earl Lites Muehlebach/Dr. Elayne Snare   1. Temporary balloon-tipped pacemaker.   2. Ascending aortography.   3. Descending aortography.   4. Placement of number 26 CoreValve Evolut Pro transcatheter aortic valve.    Significant Diagnostic Studies and Procedures: noted in brief hospital course    Consults:  Anesthesiology/CC    Patient Disposition: Home       Patient instructions/medications:     Other Activity Restrictions   -You should and need to walk daily. Your goal is to walk 30 minutes at at time without stopping for breaks. This is a daily exercise routine that you should start as soon as you arrive home. Your basic daily activities do not count toward your 30 minute minimum, e.g. housework, toileting, fixing meals. Do not exercise outside in extremely hot or cold temperatures.  -Bathing: NO tub baths, hot tubs, or swimming for 6 weeks. You may shower at any time.  -Driving: NO driving for 2 weeks or while taking narcotics  -Lifting: NO lifting more than 10 pounds (gallon of milk) for 6 weeks.  -Monitoring: If you have access to a home blood pressure cuff, record your blood pressure and heart rate daily.   Please call if your systolic blood pressure is <90 or >160, OR  if your heart rate is <50 or >120 at rest     NEGATIVE PRESSURE THERAPY DRAIN MAINTENANCE   Wound vac should remain in place until your appointment 02/10/17. Do not shower or soak in a bath tub or hot tub while wound vac is in place. You may sponge bath. Keep groin area dry.     Report These Signs and Symptoms   Please contact your doctor for the following symptoms:  *Temperature over 100 degrees F  *Uncontrolled pain  *Drainage with a foul odor  *Shortness of breath  *Racing or skipping heart beats  *Swelling or tenderness in your groin  *Painful/Cold/Discolored legs or toes  *Suture material sticking up through your incisions  *Change in coordination of ability to talk  *If you gain more than 2 pounds in 24 hours, or 5 pounds in one week.     Questions About Your Stay   For questions or concerns regarding your hospital stay. Call [phone]   Discharging attending physician: Zella Richer [[id number]]      Cardiac Diet   Limiting unhealthy fats and cholesterol is the most important step you can take in reducing your risk for cardiovascular disease. Unhealthy fats include saturated and trans fats. Monitor your sodium and cholesterol intake. Restrict your sodium to 2g (grams) or 2000mg  (milligrams) daily, and your cholesterol to 200mg  daily.    If you have questions regarding your diet at home, you may contact a dietitian at [phone].     Return Appointment   Call to schedule an appointment with your primary care doctor in 1-2 weeks for a check up and medication review.   Outside Provider Dr. Herschell Dimes      Return Appointment   CT chest at 1 pm, echo at 2pm, appt at 3:30 pm   [place] Provider Norman Clay [[id number]]    Location [facility]    Appointment date: 03/06/2017    Appointment time: 1:00 PM      Cardiac Rehab   Your physician has referred you to outpatient cardiac rehab.  Contact [facility] Cardiac Rehab Department at [phone] to schedule an appointment.     Additional Discharge Instructions   You MUST take antibiotics prior to any dental procedures (including cleaning), invasive respiratory tract procedures and invasive skin procedures.  You should NOT have any of these procedure for 3 months after your valve surgery.  This will help to prevent infection to the heart valve.  Contact your primary care provider or cardiologist for an antibiotic prescription when needed.     Return Appointment   This appointment is to have your wound vac changed.   [place] Provider Hedwig Morton [[id number]]    Location  Cardiology Clinic    Appointment date: 02/10/2017    Appointment time: 11:00 AM         Current Discharge Medication List       START taking these medications    Details   senna/docusate (SENOKOT-S) 8.6/50 mg tablet Take two tablets by mouth twice daily.  Qty: 40 tablet, Refills: 0    PRESCRIPTION TYPE:  Normal          CONTINUE these medications which have NOT CHANGED    Details   acetaminophen (TYLENOL) 325 mg tablet Take 2 Tabs by mouth every 4 hours as needed.  Qty: 30 Tab, Refills: 1    PRESCRIPTION TYPE:  Print      albuterol-ipratropium (DUO-NEB, DUO-VENT) 0.5 mg-3 mg(2.5 mg base)/3 mL nebulizer solution Inhale 3 mL solution by nebulizer as directed twice daily.    PRESCRIPTION TYPE:  Historical Med      amitriptyline (ELAVIL) 50 mg tablet Take 50 mg by mouth at bedtime daily.    PRESCRIPTION TYPE:  Historical Med      aspirin EC 81 mg tablet Take 1 Tab by mouth daily.  Qty: 90 Tab, Refills: 1 PRESCRIPTION TYPE:  Print      atorvastatin (LIPITOR) 40 mg tablet Take one tablet by mouth daily.  Qty: 90 tablet, Refills: 3    PRESCRIPTION TYPE:  Normal      budesonide respule (PULMICORT) 0.25 mg/2 mL nebulizer solution Inhale 0.25 mg solution by nebulizer as directed twice daily.    PRESCRIPTION TYPE:  Historical Med      Calcium-Cholecalciferol (D3) (CALCIUM 600 + D) 600 mg(1,500mg ) -400 unit tab Take 1 tablet by mouth daily.    PRESCRIPTION TYPE:  Historical Med      dronedarone (MULTAQ) 400 mg tablet Take one tablet by mouth twice daily with meals.  Qty: 60 tablet, Refills: 11    PRESCRIPTION TYPE:  Normal  Comments: Overnight delivery please.      ferrous sulfate 325 mg (65 mg iron) tablet Take 325 mg by mouth twice daily.    PRESCRIPTION TYPE:  Historical Med      furosemide (LASIX) 40 mg tablet TAKE ONE TABLET BY MOUTH ONCE DAILY IN THE MORNING  Qty: 90 tablet, Refills: 3    PRESCRIPTION TYPE:  Normal      omeprazole DR(+) (PRILOSEC) 40 mg capsule Take 40 mg by mouth daily.    PRESCRIPTION TYPE:  Historical Med      tiotropium bromide (SPRIVA RESPIMAT) 1.25 mcg/actuation inhaler Inhale 2 puffs by mouth into the lungs daily.    PRESCRIPTION TYPE:  Historical Med      vitamins, multiple cap Take 1 capsule by mouth daily.    PRESCRIPTION TYPE:  Historical Med      warfarin (COUMADIN) 2 mg tablet Take 2 mg by mouth at bedtime daily.  PRESCRIPTION TYPE:  Historical Med          The following medications were removed from your list. This list includes medications discontinued this stay and those removed from your prior med list in our system        carvedilol (COREG) 12.5 mg tablet        enoxaparin (LOVENOX) 100 mg syrg        lisinopril (PRINIVIL; ZESTRIL) 20 mg tablet               Scheduled appointments:    Feb 10, 2017 11:00 AM CST  Post - Op with Hedwig Morton, APRN  [facility]) [facility] [address]  [address]  [place] [address]  [phone] Feb 10, 2017  2:45 PM CST  Return Patient with Deniece Ree, MD  Cardiovascular Medicine ([facility]) [address]  [place] [address]  [phone]   Mar 06, 2017  2:00 PM CST  Echo Doppler with [facility]  Cardiovascular Medicine ([facility]) [facility]  [address]  [place] [address]  [phone]   Mar 06, 2017  3:30 PM CST  Return Patient with Norman Clay, [facility]  Cardiovascular Medicine ([facility]) [facility] [address]  [address]  [place] [address]  [phone]   Apr 10, 2017  1:00 PM CST  CT CHEST W/O CONTRAST with CT-MOB  [facility]iology [facility]) [address] Med [address] [place]  [address]nd Floor  [place] [address]  [phone]        Signed:  Emmit Alexanders, PA-C  02/03/2017      cc:  Primary Care Physician:  Lona Kettle   Verified  Referring physicians:  Deniece Ree, MD   Additional provider(s):

## 2017-02-01 NOTE — Case Management (ED)
Case Management Admission Assessment    NAME:Michelle Solis                          MRN: [id number]             DOB:[birth year]          AGE: 76 y.o.  ADMISSION DATE: 02/01/2017             DAYS ADMITTED: LOS: 0 days      Today???s Date: 02/01/2017    Source of Information: Patient       Plan  ??? Plan: CM Assessment, Assist PRN with SW/NCM Services, Discharge Planning for Home Anticipated   ??? NCM met with pt in continuation of care and dc planning. NCM introduced self and NCM/SW roles. Provided contact information and encouraged pt to reach out to [facility] team with questions or concerns.  ??? POD-0-PERCUTANEOUS TRANSCATHETER REPLACEMENT AORTIC VALVE - EVOLUT 26 PRO, LEFT COMMON FEMORAL ARTERY APPROACH: 33361 (CPT???)  ??? Pt notes she is on 2L [place], at home PTA. Oxygen provider is [facility]. Pt does not have a O2 tank with her as she was only using it at night. However, if needed at DC her son or daughter could bring an O2 tank to the hospital if needed on DC.  ??? Dr. Herschell Dimes (PCP) was managing her ATC therapy.   ??? Pt currently denying the need for HH, however, CM team will continue to follow for any DC needs that may arise.    Patient Address/Phone  [address]  [place] [address]  [phone] (home) [phone] (work)    Science writer  Extended Emergency Contact Information  Primary Emergency Contact: Dallas Breeding [place]  Home Phone: [phone]  Mobile Phone: [phone]  Relation: Daughter  Secondary Emergency Contact: Arsenio Loader [place]  Mobile Phone: [phone]  Relation: Son    Forensic scientist: Yes, patient has a healthcare directive  Type of Healthcare Directive: Durable power of attorney for healthcare  Location of Healthcare Directive: Patient does not have it with him/her      Transportation  Does the patient need discharge transport arranged?: No  Transportation Name, Phone and Availability #1: Son or daughter  Does the patient use Medicaid Transportation?: No Expected Discharge Date  Expected Discharge Date: 02/02/17    Living Situation Prior to Admission  ? Living Arrangements  Type of Residence: Home, independent  Living Arrangements: Alone  Financial risk analyst / Tub: Tub/Shower Unit  How many levels in the residence?: 2  Can patient live on one level if needed?: Yes  Does residence have entry and/or side stairs?: No  Assistance needed prior to admit or anticipated on discharge: No  Who provides assistance or could if needed?: Friend, Daughter, Son  Are they in good health?: Unknown  Can support system provide 24/7 care if needed?: Maybe  ? Level of Function   Prior level of function: Independent  ? Cognitive Abilities   Cognitive Abilities: Alert and Oriented, Engages in problem solving and planning, Participates in decision making    Financial Resources  ? Coverage  Primary Insurance: Medicare (Medicare A and B)  Secondary Insurance: Nurse, learning disability ([place] [employer] EMP Program)  Additional Coverage: RX ([place] Emp Program)    ? Source of Income   Source Of Income: Other retirement income  ? Financial Assistance Needed?  N/A    Psychosocial Needs  ? Mental Health  Mental Health  History: No  ? Substance Use History  Substance Use History Screen: In the past  Comment: Pt smoked cigarettes 3 PPD until 1996.   ? Other  N/A    Current/Previous Services  ? PCP  Lona Kettle, [phone], [phone]    Cards: [place]  ? Pharmacy    [facility] [address], [place] [address] [place] [address]  [place] [address]  Phone: [phone] Fax: [phone]    [facility] [place], [place] - [address] Portal to Registered Caremark Sites  [address] [place] [address]  Phone: [phone] Fax: [phone]    [facility] - [place], [place] - [address]  [address]  [place] [address]  Phone: [phone] Fax: [phone]    ? Durable Medical Equipment   Durable Medical Equipment at home: Rollator, Oxygen (Oxygen supplied by [employer]. Pt wears 2L at night. )  ? Home Health  Receiving home health: In the past  Agency name: [facility]  Would patient use this agency again?: Yes  ? Hemodialysis or Peritoneal Dialysis  Undergoing hemodialysis or peritoneal dialysis: No  ? Tube/Enteral Feeds  Receive tube/enteral feeds: No  ? Infusion  Receive infusions: No  ? Private Duty  Private duty help used: No  ? Home and Community Based Services  Home and community based services: No  ? Ryan White  Ryan White: No  ? Hospice  Hospice: No  ? Outpatient Therapy  PT: In the past  OT: No  SLP: No  ? Skilled Nursing Facility/Nursing Home  SNF: In the past  Name of Facility: [facility]  Would patient return for future services?: No  NH: No  ? Inpatient Rehab  IPR: In the past  Name of Facility: [facility]  Would patient return for future services?: No  ? Long-Term Acute Care Hospital  LTACH: No  ? Acute Hospital Stay  Acute Hospital Stay: In the past  Was patient's stay within the last 30 days?: No  When did patient receive care?: 1.14 months  Name of hospital: [facility]  Readmission Code Group: 8. Scheduled Readmission  8. Scheduled Readmission: 8c. Surgery/procedure  Related or Unrelated?: Related    Dahlia Bailiff RN, BSN  Integrated Nurse Case Manager  Pager [phone]   Phone [phone]

## 2017-02-01 NOTE — Progress Notes
Cardiothoracic Surgery Critical Care   Progress Note     Michelle Solis  ZOXWR'U Date:  02/01/2017  Admission Date: 02/01/2017  LOS: 0 days    POD: 0    Procedure: PERCUTANEOUS TRANSCATHETER REPLACEMENT AORTIC VALVE - EVOLUT 26 PRO, LEFT COMMON FEMORAL ARTERY APPROACH: 33361 (CPT???)    Principal Problem:    S/P TAVR (transcatheter aortic valve replacement)  Active Problems:    Chronic A-fib (HCC)    Hx of cerebral aneurysm    COPD (chronic obstructive pulmonary disease) (HCC)    Hypertension    GERD (gastroesophageal reflux disease)    Nonrheumatic aortic valve stenosis    LBBB (left bundle branch block)    Obesity, Class III, BMI 40-49.9 (morbid obesity) (HCC)    Iron deficiency anemia    Severe aortic stenosis    Coronary artery disease involving native coronary artery of native heart without angina pectoris    Chronic respiratory failure with hypoxia, on home oxygen therapy (HCC)    Chronic bilateral thoracic back pain    Chronic anticoagulation    Chronic HFrEF (heart failure with reduced ejection fraction) (HCC)      Assessment/Plan:      Neuro ??? A&O x4, MAE, denies pain. Continue PRN oxycodone and Tylenol.   CV ??? Chronic afib/aflutter with LBBB on coumadin at home. Resume coumadin tonight. Currently Afib with LBBB rate 60-80's. No pacing requirements from TVP. BP 118/55. Cont ASA 81 mg (hold for plts <80k) and atorvastatin. Hold BB and ACEI in the initial post op phase. Takes coreg & lisinopril at home. Post intervention TEE LVEF 30-40%, no RV dysfunction, LV wall 1.4 cm, trace perivalvular leak, mean gradient 8 mmHg.  Resp ??? Chronic home O2 2 L NC. Currently requiring 4 L NC. Daily CXR ??? bedside interpretation:lungs expanded, lines in place, some bibasilar atelectasis. Will review radiology report. Wean O2. Initiate IS, aggressive pulm toilet. Pt follows with pulmonology for chronic hypoxemic respiratory failure. CT chest 12/28/16 showed patchy ground glass opacities and peribronchial cuffing. Possibly r/t pulm edema from severe AS or interstitial lung disease. Pulm recommends repeat CT scan a few months after TAVR & f/u in pulm clinic. Continue pta duonebs, spiriva, pulmicort.   Renal ??? Baseline Scr 0.8-1.0. Scr 0.89 post-op. Monitor BMP, assess for AKI. Monitor UOP. Takes lasix 40 mg PO daily at home. Resume POD1. If creatinine < 2.0, replace Mg and K per CTS post op protocol.  GI - ADAT, start post op bowel regimen.   ID ??? Continue standard post op antibiotic for prophylaxis, per CTS protocol.  Heme ??? Check post op CBC/coags, assess for coagulopathy. Hold DVT prophylaxis until cleared by CTS, continue mechanical prophylaxis.   Endo ???  Hgb A1c 6 %. Continue MD[facility] SSI.   Activity ??? Bedrest x4 hours, then advance activity as tolerated. Early cardiac PT/OT. Pt had severe activity intolerance preop & was using wheelchair.     Prophylaxis Review:  Lines:  Yes; Arterial Line; Indication:  Frequent blood draws and Continuous BP monitoring; Location:  Radial  Central Line; Indication:  Frequent blood draws, Med not deliverable peripherally and Hemodynamic monitoring; Type:  Internal jugular  Antibiotic Usage:  Yes; Infection present or suspected:  periop abx  VTE:  Mechanical prophylaxis; Foot pump  Urinary Catheter: No    Disposition: Plan as above. Continue ICU care.     I have seen, personally fully evaluated, and discussed patient with the critical care attending and cardiothoracic surgeon. The patient is critically  ill s/p TAVR. I spent 60 minutes (excluding time spent performing procedures) providing and personally directing critical care services including direct OR recovery, hemodynamic monitoring and management, lab and radiology review, medication review and management, fluid and electrolyte management and coordination of care.     Bland Span, APRN-NP   CTS Intensive Care  Pager [phone]  02/01/2017    Subjective:       HPI: Michelle Solis is a 76 y.o. female with pmhx of severe AS, chronic respiratory failure with home O2 use, chronic afib/aflutter on coumadin, LBBB, chronic HFrEF, HTN, COPD, chronic back pain & GERD. S/p TAVR evolut today 11/28 with Dr. Ky Barban. She was brought to the ICU following the procedure for further management & monitoring.       REVIEW OF SYSTEMS:   Review of systems not obtained from patient due to patient factors.      Objective:        Medications:  Scheduled Meds:    amitriptyline (ELAVIL) tablet 50 mg 50 mg Oral QHS   aspirin chewable tablet 81 mg 81 mg Oral QDAY   atorvastatin (LIPITOR) tablet 40 mg 40 mg Oral QDAY   budesonide respule (PULMICORT) nebulizer solution 0.25 mg 0.25 mg Inhalation BID   ceFAZolin (ANCEF) IVP 2 g 2 g Intravenous Q8H*   ferrous sulfate (FEOSOL, FEROSUL) tablet 325 mg 325 mg Oral BID   furosemide (LASIX) tablet 40 mg 40 mg Oral QAM8   insulin aspart U-100 (NOVOLOG FLEXPEN) injection PEN 0-14 Units 0-14 Units Subcutaneous ACHS   [START ON 02/02/2017] pantoprazole DR (PROTONIX) tablet 80 mg 80 mg Oral QDAY(21)   polyethylene glycol 3350 (MIRALAX) packet 17 g 1 packet Oral QDAY   senna/docusate (SENOKOT-S) tablet 2 tablet 2 tablet Oral BID   tiotropium (SPIRIVA) capsule for inhaler 1 capsule 1 capsule Inhalation QDAY   warfarin (COUMADIN) tablet 2 mg 2 mg Oral QHS   Continuous Infusions:  ??? nitroGLYCERIN 50 mg/D5W 250 mL infusion     ??? norepinephrine (LEVOPHED) 4 mg in dextrose 5% (D5W) 250 mL IV drip (std conc)     ??? sodium chloride 0.9 %   infusion 30 mL/hr at 02/01/17 1030     PRN and Respiratory Meds:acetaminophen Q6H PRN, albuterol 0.5% Q4H PRN, [START ON 02/03/2017] bisacodyl QDAY PRN, fentaNYL citrate PF Q1H PRN, hydrALAZINE Q6H PRN, ipratropium bromide Q4H PRN, milk of magnesia (CONC) QDAY PRN, ondansetron (ZOFRAN) IV Q6H PRN, oxyCODONE Q4H PRN, potassium chloride SR PRN **OR** potassium chloride PRN Vital Signs: Last Filed                  Vital Signs: 24 Hour Range   BP: 118/55 (11/28 1000)  Temp: 36.5 ???C (97.7 ???F) (11/28 1000)  Pulse: 82 (11/28 1030)  Respirations: 13 PER MINUTE (11/28 1030)  SpO2: 94 % (11/28 1030)  O2 Delivery: Nasal Cannula (11/28 1000)  SpO2 Pulse: 76 (11/28 1030)  Height: 165.1 cm (65) (11/28 0630) BP: (94-129)/(48-66)   ABP: (114-118)/(58-66)   Temp:  [36.5 ???C (97.7 ???F)-36.6 ???C (97.9 ???F)]   Pulse:  [82-95]   Respirations:  [13 PER MINUTE-22 PER MINUTE]   SpO2:  [88 %-97 %]   O2 Delivery: Nasal Cannula   Intensity Pain Scale (Self Report): 6 (02/01/17 1000) Vitals:    02/01/17 0630   Weight: 128.9 kg (284 lb 2.8 oz)           Intake/Output Summary:  (Last 24 hours)    Intake/Output Summary (Last 24 hours) at  02/01/17 1148  Last data filed at 02/01/17 1018   Gross per 24 hour   Intake              410 ml   Output                0 ml   Net              410 ml         Physical Exam:         Neuro: A&O x4, MAE, PERRL  Cardiovascular: RRR no rub or murmur  Respiratory: Expiratory wheezes bta, diminished bilaterally  GI: soft, obese, NT, hypoactive BS  Extremities: 1+ edema x4 extremities, peripheral pulses 2+ throughout  Incisions: L groin with wound vac in place, R goin puncture c,d,i without hematoma       Laboratory:  LABS:  Recent Labs      01/31/17   1033  02/01/17   1016   NA  140  139   K  3.4*  3.4*   CL  103  105   CO2  29  26   GAP  8  8   BUN  16  13   CR  0.97  0.89   GLU  175*  167*   CA  9.2  9.1   ALBUMIN  3.6   --    MG   --   2.1   HGBA1C  6.0   --        Recent Labs      01/31/17   1033  02/01/17   1016   WBC  9.3  8.3   HGB  9.5*  9.1*   HCT  30.2*  29.6*   PLTCT  239  233   INR  1.4*  1.3*   PTT  42.9*  27.8   AST  18   --    ALT  12   --    ALKPHOS  72   --       Estimated Creatinine Clearance: 75.1 mL/min (based on SCr of 0.89 mg/dL).  Vitals:    02/01/17 0630   Weight: 128.9 kg (284 lb 2.8 oz) No results for input(s): PHART, PO2ART in the last 72 hours.    Invalid input(s): PC02A        Radiology and Other Diagnostic Procedures Review:    Reviewed

## 2017-02-01 NOTE — Anesthesia Post-Procedure Evaluation
Post-Anesthesia Evaluation    Name: Michelle DullLinda G Spearman      MRN: [id number]     DOB: 10/11/1940     Age: 76 y.o.     Sex: female   __________________________________________________________________________     Procedure Date: 02/01/2017  Procedure: Procedure(s):  PERCUTANEOUS TRANSCATHETER REPLACEMENT AORTIC VALVE - EVOLUT 26 PRO, LEFT COMMON FEMORAL ARTERY APPROACH      Surgeon: Surgeon(s):  Zella RicherMuehlebach, Gregory F, MD  Zella RicherMuehlebach, Gregory F, MD  Marcell Barlowadros, Peter N, MD, [facility]    Post-Anesthesia Vitals   142/90 ABP  SpO2: 94 % on 4L NC  HR 83  Resp: 18        Post Anesthesia Evaluation Note    Evaluation location: ICU  Patient participation: recovered; patient participated in evaluation  Level of consciousness: alert  Pain management: adequate    Hydration: normovolemia  Temperature: 36.0C - 38.4C  Airway patency: adequate    Perioperative Events  Perioperative events:  no       Post-op nausea and vomiting: no PONV    Postoperative Status  Cardiovascular status: hemodynamically stable  Respiratory status: spontaneous ventilation and supplemental oxygen (NC)  ICU Information          Staff involved in transport include: anesthesiologist, OR nurse and CRNA      Perioperative Events  Perioperative Event: No  Emergency Case Activation: No

## 2017-02-01 NOTE — Operative Report(Direct Entry)
TAVR (Transcatheter Aortic Valve Replacement)  Date of procedure: 02/01/2017    Duncan Dull, DOB: [birth year]  MRN: [id number]      INDICATIONS FOR PROCEDURE: Michelle Solis  is a 76 y.o.  who has severe symptomatic aortic valve stenosis and Class III heart failure. In view of significant co-morbidities of Class III obesity, frailty,   COPD, She is felt to be at intermediate risk to undergo standard open surgical AVR as evaluated by the combined heart team. After a discussion with the combined heart team of the risks and benefits and alternative of proceeding with surgical versus transcatheter AVR, Duncan Dull has elected to proceed with TAVR.      OPERATORS:   1. Elayne Snare, MD, Interventional Cardiology.  2. Philemon Kingdom, MD, Cardiothoracic Surgery.    PROCEDURES PERFORMED:   1. Transvenous pacemaker through right IJ access.  2. Ultrasound guided right CFA access  3. Left heart cardiac catheterization with simultaneous pressure assessment of the LV and aorta both pre and post TAVR.  4. Ascending aortography.  5. Descending aortography.  6. Transcatheter valve replacement utilizing a number 26 Evolut Pro. Serial Number [id number]      PROCEDURE:   1. Patient was informed and consented to risks, benefits, and alternatives. Patient verbalized understanding and wished to proceed.  2. General anesthesia was performed under the care of Dr. Orlan Leavens of the cardiac anesthesia service. The anesthesia team monitored the patient's hemodynamics, airway, and sedation throughout the case.   3. Access was obtained in the right common femoral artery with placement of 5-French sheath via ultrasound guidance. Access also obtained in the left common femoral artery with placement of an 8-French sheath via surgical cutdown. This was up sized to a 16 Fr delivery sheath  4. A 5Fr Diag bipolar balloon-tipped temporary pacemaker was advanced to the right ventricle where appropriate pacing thresholds were obtained. 5. Utilizing an AL1 catheter and Glidewire, access was obtained into the left ventricle.  6. Initial hemodynamics were performed utilizing simultaneous pigtail catheter assessments in both the aortic root and left ventricle. Hemodynamics  revealed an LV pressure of 120/22 LVEDP and a  mean aortic  gradient of 24 mmHg.  7. Utilizing an Safari small wire in the left ventricle, the valve was deployed under fluoroscopic guidance. The valve was well seated and had an average annular depth of 4 mm  8. TEE revealed a well seated TAVR valve with  trace paravalvular regurgitation and no evidence of central valve regurgitation.    9.  Ascending aortography revealed no aortic valve regurgitation.  Excellent flow was noted within the coronary arteries as well as in the ascending aorta. There was no evidence of dissection, perforation, intramural hematoma, plaque disruption  or thrombus.  10. Post hemodynamic measurements had been obtained which revealed an LV pressure 140/26 LVEDP  with a mean gradient of 6 mmHg.   11. The left common femoral 16-French sheath was then removed and the  arteriotomy was closed surgically. A descending aortography was performed with no evidence of dissection, perforation, intramural hematoma, and there was excellent brisk flow in the iliofemoral system. The  right common femoral artery sheath was removed and the arteriotomy was closed with a  Angio-Seal.  12. Patient was extubated in the hybrid room at the conclusion of the case and transported to CTI in stable condition.  13. At the conclusion of the case the patient's transvenous pacemaker was sutured in place after pacing thresholds were re-checked.   14.  A total of 50 mL of Visapaque 320 was utilized for the procedure.  15. Air Kerma was 856 mGy.      IMPRESSION:   1. Severe symptomatic aortic valve stenosis.  2. Successful transcatheter valve replacement utilizing a number 26 Evolut Pro.   -Residual transaortic mean gradient of 6 mmHg. -Trace paravalvular regurgitation. No central valve regurgitation was noted.    RECOMMENDATIONS:   1. SBE prophylaxis indefinitely.  2. Aspirin 81 mg indefinitely  3. Echo Doppler in the morning.  4. Early ambulation.    Elayne Snare, MD

## 2017-02-01 NOTE — Progress Notes
RT Adult Assessment Note    NAME:Zamara Abner GreenspanG Moret             MRN: [id number]             DOB:02/01/1941          AGE: 76 y.o.  ADMISSION DATE: 02/01/2017             DAYS ADMITTED: LOS: 0 days    RT Treatment Plan:  Protocol Plan: Medications  Albuterol/Ipratropium: Neb PRN  Tiotropium: MDI Q Day    Protocol Plan: Procedures  PEP Therapy:  (PEP self)  Oxygen/Humidity: O2 to keep SpO2 > 92% (2 LPM home 02)  Monitoring: Pulse oximetry BID & PRN  Comment: IS with nursing    Additional Comments:  Impressions of the patient: Pt without complaints at this time. Performed well   Intervention(s)/outcome(s): IS/PEP self    Recommendations to the care team: Pt on two LABAs (Spiriva and Atrovent)per home medication regiment, this is contraindicated    Vital Signs:  Pulse: Pulse: 76  RR: Respirations: 19 PER MINUTE  SpO2: SpO2: 95 %  O2 Device:    Liter Flow:    O2%:    Breath Sounds:    Respiratory Effort:

## 2017-02-01 NOTE — Anesthesia Procedure Notes
Anesthesia Procedure: Central Venous Catheter Line    CENTRAL LINE INSERTION  Date/Time: 02/01/2017 8:08 AM    Patient location: OR  Indications: medications requiring CV access, inadequate peripheral IV access, hemodynamic pressure monitoring and other    Preprocedure checklist performed: 2 patient identifiers, risks & benefits discussed, patient evaluated, timeout performed, consent obtained, patient being monitored and CVC bundle followed (proper hand washing, maximal sterile barrier technique with cap, sterile gown, sterile glove, sterile drape, and skin prep for antisepsis)        CVC Line Insertion Procedure  Skin prepped with chlorhexidine; skin prep agent completely dried prior to procedure.  Patient Position: supine  Location: internal jugular vein  Laterality: right  Vein identification: ultrasound guided  Confirmation of venous placement prior to dilation of vein by: ultrasound  Ultrasound image captured  Number of attempts: 1  Successful placement: yes  Patient sedated: yes    Sedation given: general    Catheter:     Catheter type: introducer placed using standard wire through needle technique     Catheter size: 7 Fr     Insertion depth: 10 cm    Procedure Outcome  Post procedure: all ports aspirated; Dressing: sterile occlusive dressing  Placement verification: placement verified by x-ray  Events: none  Observations: patient tolerated well      Performed by: Orlan LeavensTMAN, Crestina Strike  Authorized by: Orlan LeavensTMAN, Jamesyn [place]

## 2017-02-01 NOTE — Anesthesia Procedure Notes
Anesthesia Procedure: Arterial Line Placement    A-LINE INSERTION  Date/Time: 02/01/2017 7:46 AM    Patient location: OR  Indications: multiple ABGs, frequent labs and hemodynamic monitoring      Preprocedure checklist performed: 2 patient identifiers, risks & benefits discussed, patient evaluated, timeout performed, consent obtained, patient being monitored and sterile drape    Sterile technique:  - Proper hand washing  - Cap, mask  - Sterile gloves  - Skin prep for antisepsis        Arterial Line Procedure   Patient sedated: no    Artery prepped with chlorhexidine; skin prep agent completely dried prior to procedure.  Location: radial artery  Laterality: left  Technique: ultrasound and palpation  Needle gauge: 4 Fr.  Number of attempts: 4    Procedure Outcome  Catheter secured with adhesive dressing applied  Events: hematoma during insertion and skin intact, warm, and dry    Observation: pt tolerated well    Additional notes:   Multiple attempts. 2 on R, 2 on L. Micropuncture kit required to thread catheter.        Performed by: Johny Sax  Authorized by: Al Decant

## 2017-02-01 NOTE — Progress Notes
Critical Care Progress Note          Today's Date:  02/01/2017  Name:  Michelle Solis                       MRN:  [id number]   Admission Date: 02/01/2017  LOS: 0 days                     Assessment/Plan:   Active Problems:    Chronic A-fib (HCC)    COPD (chronic obstructive pulmonary disease) (HCC)    Hypertension    GERD (gastroesophageal reflux disease)    Nonrheumatic aortic valve stenosis    LBBB (left bundle branch block)    Obesity, Class III, BMI 40-49.9 (morbid obesity) (HCC)    Iron deficiency anemia    Severe aortic stenosis    Coronary artery disease involving native coronary artery of native heart without angina pectoris    Chronic respiratory failure with hypoxia, on home oxygen therapy [facility])        76 yo F with history of aortic stenosis now s/p TAVR 11/28. 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    Neuro: Prn pain meds; resume home elavil.   Cardiac: s/p TAVR, AFib with LBBB, no pacing requirements. No acute indication for diuretic, but may yet require. Hold anti hypertensives for now. EF 30-40%. Asa/atorva.  Pulmonary: On NC, encourage good pulmonary hygiene. Continue home inhalers. OOBTC/Ambulate.  FEN: ADAT, bowel regimen  ID:  Periop ancef, follow up urine culture.  Renal:  Normal renal function preop, monitor urine output. Lasix to start tomorrow.  Heme:  Chronic anemia, stable. Coumadin tonight.  Endo:  SSI PRN   Prophylaxis: HOB>40, DVT prophylaxis per primary team  Disposition/Family: Needs ICU   __________________________________________________________________________________  Subjective:  Michelle Solis is a 76 y.o. female.  Overnight Events: To OR.  Patient Congestion improved, pain well controlled.  Objective:  Medications:  Scheduled Meds:  amitriptyline (ELAVIL) tablet 50 mg 50 mg Oral QHS aspirin chewable tablet 81 mg 81 mg Oral QDAY   atorvastatin (LIPITOR) tablet 40 mg 40 mg Oral QDAY   budesonide respule (PULMICORT) nebulizer solution 0.25 mg 0.25 mg Inhalation BID   ceFAZolin (ANCEF) IVP 2 g 2 g Intravenous Q8H*   ferrous sulfate (FEOSOL, FEROSUL) tablet 325 mg 325 mg Oral BID   furosemide (LASIX) tablet 40 mg 40 mg Oral QAM8   insulin aspart U-100 (NOVOLOG FLEXPEN) injection PEN 0-14 Units 0-14 Units Subcutaneous ACHS   [START ON 02/02/2017] pantoprazole DR (PROTONIX) tablet 80 mg 80 mg Oral QDAY(21)   senna/docusate (SENOKOT-S) tablet 2 tablet 2 tablet Oral BID   tiotropium (SPIRIVA) capsule for inhaler 1 capsule 1 capsule Inhalation QDAY   warfarin (COUMADIN) tablet 2 mg 2 mg Oral QHS   Continuous Infusions:  ??? nitroGLYCERIN 50 mg/D5W 250 mL infusion     ??? norepinephrine (LEVOPHED) 4 mg in dextrose 5% (D5W) 250 mL IV drip (std conc)     ??? sodium chloride 0.9 %   infusion 30 mL/hr at 02/01/17 1030     PRN and Respiratory Meds:acetaminophen Q6H PRN, albuterol 0.5% Q4H PRN, [START ON 02/03/2017] bisacodyl QDAY PRN, fentaNYL citrate PF Q1H PRN, hydrALAZINE Q6H PRN, ipratropium bromide Q4H PRN, milk of magnesia (CONC) QDAY PRN, ondansetron (ZOFRAN) IV Q6H PRN, oxyCODONE Q4H  PRN, potassium chloride SR PRN **OR** potassium chloride PRN                     Vital Signs: Last Filed                  Vital Signs: 24 Hour Range   BP: 118/55 (11/28 1000)  ABP: 118/58 (11/28 1030)  Temp: 36.5 ???C (97.7 ???F) (11/28 1000)  Pulse: 82 (11/28 1030)  Respirations: 13 PER MINUTE (11/28 1030)  SpO2: 94 % (11/28 1030)  O2 Delivery: Nasal Cannula (11/28 1000)  Height: 165.1 cm (65) (11/28 0630)  Weight: 128.9 kg (284 lb 2.8 oz) (11/28 0630)  Dosing / Dry Weight: 128.9 kg (284 lb 2.8 oz) (11/28 0630)  BP: (94-129)/(48-66)   ABP: (114-118)/(58-66)   Temp:  [36.5 ???C (97.7 ???F)-36.6 ???C (97.9 ???F)]   Pulse:  [82-95]   Respirations:  [13 PER MINUTE-22 PER MINUTE]   SpO2:  [88 %-97 %]   O2 Delivery: Nasal Cannula Intensity Pain Scale (Self Report): 6 (02/01/17 1000) Vitals:    02/01/17 0630   Weight: 128.9 kg (284 lb 2.8 oz)       Critical Care Vitals:      ICP Monitoring:     PA  Catheter:     Hemodynamics/Oxycalcs:       Intake/Output Summary:  (Last 24 hours)    Intake/Output Summary (Last 24 hours) at 02/01/17 1113  Last data filed at 02/01/17 1018   Gross per 24 hour   Intake              410 ml   Output                0 ml   Net              410 ml         Physical Exam:  General:  no distress, appears stated age  Lungs:  Diminished breath sounds, no wheezing  CV: irregularly irregular  Abdomen:  Soft, non-tender. Obese. Bowel sounds normal.  No masses.  No organomegaly.  Extremities:  Extremities normal, atraumatic, no cyanosis or edema  Neurologic:  CNII - XII intact.  PERRL      Prophylaxis Review:  Lines:  Yes; Arterial Line; Indication:  Frequent blood draws and Continuous BP monitoring; Location:  Radial  Central Line; Indication:  Hemodynamic monitoring; Type:  Internal jugular  Urinary Catheter:    Antibiotic Usage:  Periop  VTE: SCD    Lab Review:  Pertinent labs reviewed  Point of Care Testing:  (Last 24 hours):  Glucose: (!) 167 (02/01/17 1016)  POC Glucose (Download): (!) 128 (02/01/17 9811)    Radiology and Other Diagnostic Procedures Review:    Pertinent radiology reviewed.    I have seen, examined and reviewed data concerning this patient.  I discussed the findings and plan of care with the ICU team. I spent 45 minutes in critical care time, excluding procedures today.    Farley Ly, MD  Anesthesiology and Critical Care  [phone]

## 2017-02-01 NOTE — Progress Notes
Pt walked 75 ft in hall without complication. Only C/O fatigue.

## 2017-02-02 DIAGNOSIS — I352 Nonrheumatic aortic (valve) stenosis with insufficiency: Principal | ICD-10-CM

## 2017-02-02 LAB — CULTURE-URINE W/SENSITIVITY
Lab: >10
Lab: >10 — AB

## 2017-02-02 LAB — MAGNESIUM: Lab: 2.1 mg/dL — AB (ref 1.6–2.6)

## 2017-02-02 LAB — PROTIME INR (PT): Lab: 1.4 ng/mL — ABNORMAL HIGH (ref 0.8–1.2)

## 2017-02-02 LAB — CBC: Lab: 10 K/UL — ABNORMAL LOW (ref >60)

## 2017-02-02 LAB — POC GLUCOSE
Lab: 203 mg/dL — ABNORMAL HIGH (ref 70–100)
Lab: 128 mg/dL — ABNORMAL HIGH (ref 70–100)
Lab: 99 mg/dL (ref 70–100)
Lab: 124 mg/dL — ABNORMAL HIGH (ref 70–100)

## 2017-02-02 LAB — POTASSIUM: Lab: 4.1 MMOL/L (ref 3.5–5.1)

## 2017-02-02 LAB — BASIC METABOLIC PANEL
Lab: 107 MMOL/L — ABNORMAL LOW (ref 98–110)
Lab: 140 MMOL/L — ABNORMAL LOW (ref 137–147)

## 2017-02-02 MED ORDER — DRONEDARONE 400 MG PO TAB
400 mg | Freq: Two times a day (BID) | ORAL | 0 refills | Status: DC
Start: 2017-02-02 — End: 2017-02-03
  Administered 2017-02-02 – 2017-02-03 (×3): 400 mg via ORAL

## 2017-02-02 MED ORDER — FUROSEMIDE 10 MG/ML IJ SOLN
40 mg | Freq: Once | INTRAVENOUS | 0 refills | Status: CP
Start: 2017-02-02 — End: ?
  Administered 2017-02-02: 16:00:00 40 mg via INTRAVENOUS

## 2017-02-02 MED ORDER — ALBUTEROL SULFATE 2.5 MG/0.5 ML IN NEBU
2.5 mg | Freq: Two times a day (BID) | RESPIRATORY_TRACT | 0 refills | Status: DC | PRN
Start: 2017-02-02 — End: 2017-02-03
  Administered 2017-02-03 (×2): 2.5 mg via RESPIRATORY_TRACT

## 2017-02-02 MED ORDER — PERFLUTREN LIPID MICROSPHERES 1.1 MG/ML IV SUSP
1-20 mL | Freq: Once | INTRAVENOUS | 0 refills | Status: CP
Start: 2017-02-02 — End: ?
  Administered 2017-02-02: 15:00:00 2 mL via INTRAVENOUS

## 2017-02-02 MED ORDER — SENNOSIDES-DOCUSATE SODIUM 8.6-50 MG PO TAB
2 | ORAL_TABLET | Freq: Two times a day (BID) | ORAL | 0 refills | Status: CN
Start: 2017-02-02 — End: ?

## 2017-02-02 MED ORDER — CARVEDILOL 6.25 MG PO TAB
6.25 mg | Freq: Two times a day (BID) | ORAL | 0 refills | Status: DC
Start: 2017-02-02 — End: 2017-02-03
  Administered 2017-02-02: 16:00:00 6.25 mg via ORAL

## 2017-02-02 NOTE — Progress Notes
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    COPD (chronic obstructive pulmonary disease) (HCC)    Hypertension    GERD (gastroesophageal reflux disease)    Nonrheumatic aortic valve stenosis    LBBB (left bundle branch block)    Obesity, Class III, BMI 40-49.9 (morbid obesity) (HCC)    Iron deficiency anemia    Severe aortic stenosis    Coronary artery disease involving native coronary artery of native heart without angina pectoris    Chronic respiratory failure with hypoxia, on home oxygen therapy (HCC)    Chronic bilateral thoracic back pain    Chronic anticoagulation    Chronic HFrEF (heart failure with reduced ejection fraction) (HCC)    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 Pain controlled with APAP.   CV ??? Continue to monitor hemodynamic stability and for rhythm changes. Cont ASA 81 mg (hold for plts <80k) and statin. Start low dose BB today.Hold ACEI in post op phase. Intra op TEE LVEF 30-40%.  Resp ??? Daily CXR ??? bedside interpretation: persistent congestion with chronic disease. Will review radiology report. On home 02 at 2L. Cont IS, aggressive pulm toilet.   Renal ??? Monitor BMP, assess for AKI. D/C foley after pt ambulates 3 times. Marland Kitchen   GI - ADAT, continue post op bowel regimen.   ID ??? Continue standard post op antibiotic for prophylaxis, per CTS protocol.   Heme ??? Hgb 8.1, continue to monitor acute blood loss anemia. Hold DVT prophylaxis until POD 3, continue mechanical prophylaxis.   FEN ??? replace Mg and K prn. SSI prn. Activity ??? Pt up to chair today, should ambulate in halls with nursing and cardiac rehab. Early cardiac PT/OT.     Prophylaxis Review:  Lines:  No - remove today   Antibiotic Usage:  No  VTE:  Mechanical prophylaxis; Sequential compression device and Foot pump  Urinary Catheter: No - remove today     Disposition: Plan as above, This patient is post-cardiac surgery and at risk for life threatening deterioration. Recovering as expected.  Anticipate transfer to the floor later today. Pt will d/c home with Prevena vac, will follow up in clinic on 12/7 for dressing change. Discussed with NCM and outpt clinic.     I have seen, personally fully evaluated, and discussed patient with the critical care attending and cardiothoracic surgeon. The patient is critically ill and at risk for life threatening deterioration. I spent 60 minutes (excluding time spent performing or supervising any procedures) providing and personally directing critical care services including hemodynamic monitoring and management, lab and radiology review, medication review and management, fluid and electrolyte management and coordination of care.       Luis Abed, PA-C   CTS Intensive Care  Pager [phone]  02/02/2017    Subjective:       HPI:   Michelle Solis is a 76 y.o. female who underwent TAVR 11/28. Pt was transferred to the ICU for further care.  REVIEW OF SYSTEMS:   Constitutional: positive for fatigue, negative for fevers and chills  Respiratory: positive for dyspnea on exertion, negative for cough, sputum or increased work of breathing  Cardiovascular: negative for chest pressure/discomfort, palpitations, irregular heart beats  Gastrointestinal: negative for nausea and vomiting  Neurological: positive for weakness, negative for headaches and dizziness      Objective:        Medications:  Scheduled Meds:  albuterol 0.5% (PROVENTIL; VENTOLIN) nebulizer solution 2.5 mg 2.5 mg Inhalation BID & PRN amitriptyline (ELAVIL) tablet 50 mg 50 mg Oral QHS   aspirin chewable tablet 81 mg 81 mg Oral QDAY   atorvastatin (LIPITOR) tablet 40 mg 40 mg Oral QDAY   budesonide respule (PULMICORT) nebulizer solution 0.25 mg 0.25 mg Inhalation BID   carvedilol (COREG) tablet 6.25 mg 6.25 mg Oral BID   dronedarone (MULTAQ) tablet 400 mg 400 mg Oral BID w/meals   ferrous sulfate (FEOSOL, FEROSUL) tablet 325 mg 325 mg Oral BID   furosemide (LASIX) tablet 40 mg 40 mg Oral QAM8   insulin aspart U-100 (NOVOLOG FLEXPEN) injection PEN 0-14 Units 0-14 Units Subcutaneous ACHS   pantoprazole DR (PROTONIX) tablet 80 mg 80 mg Oral QDAY(21)   polyethylene glycol 3350 (MIRALAX) packet 17 g 1 packet Oral QDAY   senna/docusate (SENOKOT-S) tablet 2 tablet 2 tablet Oral BID   tiotropium (SPIRIVA) capsule for inhaler 1 capsule 1 capsule Inhalation QDAY   warfarin (COUMADIN) tablet 2 mg 2 mg Oral QHS   Continuous Infusions:  ??? nitroGLYCERIN 50 mg/D5W 250 mL infusion     ??? norepinephrine (LEVOPHED) 4 mg in dextrose 5% (D5W) 250 mL IV drip (std conc)     ??? sodium chloride 0.9 %   infusion Stopped (02/02/17 1000)     PRN and Respiratory Meds:acetaminophen Q6H PRN, [START ON 02/03/2017] bisacodyl QDAY PRN, fentaNYL citrate PF Q1H PRN, hydrALAZINE Q6H PRN, milk of magnesia (CONC) QDAY PRN, ondansetron (ZOFRAN) IV Q6H PRN, oxyCODONE Q4H PRN, potassium chloride SR PRN **OR** potassium chloride PRN                       Vital Signs: Last Filed                  Vital Signs: 24 Hour Range   BP: 106/63 (11/29 1228)  Temp: 36.6 ???C (97.9 ???F) (11/29 1228)  Pulse: 80 (11/29 1100)  Respirations: 16 PER MINUTE (11/29 1228)  SpO2: 93 % (11/29 1228)  O2 Delivery: Nasal Cannula (11/29 1228)  SpO2 Pulse: 80 (11/29 1100)  Height: 165.1 cm (65) (11/29 0928) BP: (106-130)/(53-72)   ABP: (106-144)/(38-60)   Temp:  [36.5 ???C (97.7 ???F)-36.8 ???C (98.3 ???F)]   Pulse:  [71-95]   Respirations:  [9 PER MINUTE-21 PER MINUTE]   SpO2:  [93 %-100 %]   O2 Delivery: Nasal Cannula Intensity Pain Scale (Self Report): 1 (02/02/17 0400) Vitals:    02/01/17 0630 02/02/17 0600 02/02/17 0928   Weight: 128.9 kg (284 lb 2.8 oz) 128.3 kg (282 lb 13.6 oz) 128.3 kg (282 lb 13.6 oz)           Intake/Output Summary:  (Last 24 hours)    Intake/Output Summary (Last 24 hours) at 02/02/17 1509  Last data filed at 02/02/17 1230   Gross per 24 hour   Intake             1670 ml   Output  2400 ml   Net             -730 ml         Physical Exam:         Neuro: A&O x 4, MAE =  Cardiovascular: RRR no rub or murmur  Respiratory: LS CTA bil - diminished in the bases  GI: soft, NT, hypoactive BS  Extremities: 1+ pedal edema  Incisions: wound vac in place to left groin, dressing intact. Plan to change to Prevena vac tomorrow.     Pertinent Meds:               Taking    Reason for Not Taking  1. Aspirin yes    2. B-Blocker yes    3. Statin yes    4. ACE/ARB         Artificial airway:  None                                                                                         Vent weaning trial:  Not applicable    Laboratory:  LABS:  Recent Labs      01/31/17   1033  02/01/17   1016  02/01/17   2123  02/02/17   0340   NA  140  139   --   140   K  3.4*  3.4*  4.1  4.6   CL  103  105   --   107   CO2  29  26   --   29   GAP  8  8   --   4   BUN  16  13   --   12   CR  0.97  0.89   --   0.84   GLU  175*  167*   --   118*   CA  9.2  9.1   --   9.2   ALBUMIN  3.6   --    --    --    MG   --   2.1  2.1   --    HGBA1C  6.0   --    --    --        Recent Labs      01/31/17   1033  02/01/17   1016  02/02/17   0340   WBC  9.3  8.3  10.6   HGB  9.5*  9.1*  8.1*   HCT  30.2*  29.6*  27.3*   PLTCT  239  233  213   INR  1.4*  1.3*  1.4*   PTT  42.9*  27.8   --    AST  18   --    --    ALT  12   --    --    ALKPHOS  72   --    --       Estimated Creatinine Clearance: 79.3 mL/min (based on SCr of 0.84 mg/dL).  Vitals:    02/01/17 0630 02/02/17 0600 02/02/17 0928 Weight: 128.9 kg (284 lb 2.8 oz) 128.3 kg (282  lb 13.6 oz) 128.3 kg (282 lb 13.6 oz)    No results for input(s): PHART, PO2ART in the last 72 hours.    Invalid input(s): PC02A        Radiology and Other Diagnostic Procedures Review:    Reviewed

## 2017-02-02 NOTE — Progress Notes
CARDIOPULMONARY REHABILITATION  INPATIENT ASSESSMENT    Cardiac Rehabilitation Staff: Raul Del Discharge Date:     Demographics  Pre-admit Dx: Aortic Stenosis Date of Admission: 02/01/2017     Room: HC315/01 DOB:  Jul 18, 1940   Insurance: Primary: medicare  Secondary:  Bc/bs fed   Address: [address]  [place] [address]   Patient Phone:  [phone] (home) [phone] (work)   Marital Status: Divorced  Occupation: Professional   ED Contact: Sherri Sear  ED Phone #: [phone]   CTS: Ky Barban  Cardiologist: Mackey Birchwood     Cardiac Procedures and Events     Valve: 02/01/17 (TAVR)                       Risk Factors  Risk Factors: Hypertension, Obesity  BP: 113/60  Height: 165.1 cm (65)  Weight: 128.9 kg (284 lb 2.8 oz)  BMI (Calculated): 47.29      Medical History   has a past medical history of Abnormal chest CT; Aortic stenosis; Atrial flutter (HCC); Cerebral aneurysm rupture (HCC) (1998); COPD (chronic obstructive pulmonary disease) (HCC); Dyspnea; GERD (gastroesophageal reflux disease) (09/13/2011); History of rheumatic fever (during childhood); Hypertension (09/13/2011); O2 dependent; and Seizures (HCC).    Labs  Cholesterol   Date Value Ref Range Status   12/28/2016 156 <200 MG/DL Final     Triglycerides   Date Value Ref Range Status   12/28/2016 125 <150 MG/DL Final     HDL   Date Value Ref Range Status   12/28/2016 38 (L) >40 MG/DL Final     LDL   Date Value Ref Range Status   12/28/2016 104 (H) <100 MG/DL Final     Hemoglobin B2W   Date Value Ref Range Status   01/31/2017 6.0 4.0 - 6.0 % Final     Comment:     The ADA recommends that most patients with type 1 and type 2 diabetes maintain   an A1c level <7%.       Troponin-I   Date Value Ref Range Status   06/06/2016 <0.02  Final         Heart Resource Manual Given:       Teaching Completed:       Outpatient Cardiopulmonary Rehabilitation    OPCR:      Referral Faxed to:       Date Faxed:      Location:      [facility], Sent to Staff: Raul Del, RN  02/02/2017

## 2017-02-02 NOTE — Progress Notes
2000-Assessment. Patient up in chair, daughter at bedside. Monitor with 1st degree block,occ. PAC. No complaints of pain. Voiding without difficulty.     2300-Having irregular pauses per ECG, EKG done and remains in 1st degree block with PACs.

## 2017-02-02 NOTE — Progress Notes
RT Adult Assessment Note    NAME:Michelle Solis             MRN: [id number]             DOB:[birth year]          AGE: 76 y.o.  ADMISSION DATE: 02/01/2017             DAYS ADMITTED: LOS: 1 day    RT Treatment Plan:  Protocol Plan: Medications  Albuterol: Neb BID;Neb PRN  Tiotropium: MDI Q Day    Protocol Plan: Procedures  PEP Therapy:  (PEP on own)  IPPB: Place a nursing order for "IS Q1h While Awake" for any of Lung Expansion indicators  Oxygen/Humidity: O2 to keep SpO2 > 92%  Monitoring: Pulse oximetry BID & PRN  Comment:  (Budesonide BID; 2L home O2)    Additional Comments:  Impressions of the patient: no distress noted  Intervention(s)/outcome(s):   Patient education that was completed:   Recommendations to the care team:     Vital Signs:  Pulse: Pulse: 83  RR: Respirations: 12 PER MINUTE  SpO2: SpO2: 100 %  O2 Device: $$ O2 Device: Cannula  Liter Flow: O2 Liter Flow: 2 lpm  O2%:    Breath Sounds:  clear/ dim  Respiratory Effort:  non-labored

## 2017-02-02 NOTE — Progress Notes
Doing well, no pacing overnite, echo being done, looks good.  Groins ok, wound vac in place, keep another day.  20 minutes spent reviewing data and discussing with pt and daughter.  GFM

## 2017-02-03 ENCOUNTER — Inpatient Hospital Stay: Admit: 2017-02-02 | Discharge: 2017-02-02 | Payer: MEDICARE

## 2017-02-03 ENCOUNTER — Inpatient Hospital Stay: Admit: 2017-02-01 | Discharge: 2017-02-01 | Payer: MEDICARE

## 2017-02-03 ENCOUNTER — Inpatient Hospital Stay: Admit: 2017-02-01 | Discharge: 2017-02-03 | Disposition: A | Discharge status: Home Health | Payer: MEDICARE

## 2017-02-03 ENCOUNTER — Ambulatory Visit: Admit: 2017-01-31 | Discharge: 2017-01-31 | Payer: MEDICARE

## 2017-02-03 ENCOUNTER — Inpatient Hospital Stay: Admit: 2017-02-03 | Discharge: 2017-02-03 | Payer: MEDICARE

## 2017-02-03 ENCOUNTER — Encounter: Admit: 2017-02-03 | Discharge: 2017-02-03 | Payer: MEDICARE

## 2017-02-03 DIAGNOSIS — G8929 Other chronic pain: ICD-10-CM

## 2017-02-03 DIAGNOSIS — J9611 Chronic respiratory failure with hypoxia: ICD-10-CM

## 2017-02-03 DIAGNOSIS — K219 Gastro-esophageal reflux disease without esophagitis: ICD-10-CM

## 2017-02-03 DIAGNOSIS — Z9981 Dependence on supplemental oxygen: ICD-10-CM

## 2017-02-03 DIAGNOSIS — I5022 Chronic systolic (congestive) heart failure: ICD-10-CM

## 2017-02-03 DIAGNOSIS — Z7901 Long term (current) use of anticoagulants: ICD-10-CM

## 2017-02-03 DIAGNOSIS — Z982 Presence of cerebrospinal fluid drainage device: ICD-10-CM

## 2017-02-03 DIAGNOSIS — I2510 Atherosclerotic heart disease of native coronary artery without angina pectoris: ICD-10-CM

## 2017-02-03 DIAGNOSIS — Z006 Encounter for examination for normal comparison and control in clinical research program: ICD-10-CM

## 2017-02-03 DIAGNOSIS — I350 Nonrheumatic aortic (valve) stenosis: ICD-10-CM

## 2017-02-03 DIAGNOSIS — D509 Iron deficiency anemia, unspecified: ICD-10-CM

## 2017-02-03 DIAGNOSIS — I447 Left bundle-branch block, unspecified: ICD-10-CM

## 2017-02-03 DIAGNOSIS — I352 Nonrheumatic aortic (valve) stenosis with insufficiency: Principal | ICD-10-CM

## 2017-02-03 DIAGNOSIS — Z6841 Body Mass Index (BMI) 40.0 and over, adult: ICD-10-CM

## 2017-02-03 DIAGNOSIS — I4892 Unspecified atrial flutter: ICD-10-CM

## 2017-02-03 DIAGNOSIS — M1990 Unspecified osteoarthritis, unspecified site: ICD-10-CM

## 2017-02-03 DIAGNOSIS — I10 Essential (primary) hypertension: Principal | ICD-10-CM

## 2017-02-03 DIAGNOSIS — I607 Nontraumatic subarachnoid hemorrhage from unspecified intracranial artery: ICD-10-CM

## 2017-02-03 DIAGNOSIS — I110 Hypertensive heart disease with heart failure: ICD-10-CM

## 2017-02-03 DIAGNOSIS — J449 Chronic obstructive pulmonary disease, unspecified: ICD-10-CM

## 2017-02-03 DIAGNOSIS — I482 Chronic atrial fibrillation: ICD-10-CM

## 2017-02-03 DIAGNOSIS — J9811 Atelectasis: ICD-10-CM

## 2017-02-03 DIAGNOSIS — R9389 Abnormal findings on diagnostic imaging of other specified body structures: ICD-10-CM

## 2017-02-03 DIAGNOSIS — R0600 Dyspnea, unspecified: ICD-10-CM

## 2017-02-03 DIAGNOSIS — Z8679 Personal history of other diseases of the circulatory system: ICD-10-CM

## 2017-02-03 DIAGNOSIS — R569 Unspecified convulsions: ICD-10-CM

## 2017-02-03 LAB — POC GLUCOSE
Lab: 120 mg/dL — ABNORMAL HIGH (ref 70–100)
Lab: 117 mg/dL — ABNORMAL HIGH (ref 70–100)
Lab: 128 mg/dL — ABNORMAL HIGH (ref 70–100)

## 2017-02-03 LAB — CBC: Lab: 7.9 K/UL — ABNORMAL LOW (ref 4.5–11.0)

## 2017-02-03 LAB — BASIC METABOLIC PANEL: Lab: 138 MMOL/L — ABNORMAL LOW (ref 137–147)

## 2017-02-03 LAB — PROTIME INR (PT): Lab: 1.4 MMOL/L — ABNORMAL HIGH (ref 0.8–1.2)

## 2017-02-03 MED ORDER — SENNOSIDES-DOCUSATE SODIUM 8.6-50 MG PO TAB
2 | ORAL_TABLET | Freq: Two times a day (BID) | ORAL | 0 refills | Status: SS
Start: 2017-02-03 — End: 2017-02-17

## 2017-02-03 NOTE — Progress Notes
02/03/17 1256   Cardiac Rehab Activity   Distance Walked (feet) 110 ft   BP Pre-activity 115/48   BP Post-activity 141/57   HR Pre-activity 94 bpm   HR Post-activity 108   SaO2 Pre-activity 94 %   SaO2 Post-activity 96   O2 Device Nasal Cannula   O2 (lpm) 2 LPM   Comments Pt ambulated at a slow pace with a wheeled walker. Pt reporting her back limits her ambulation. Pt reporting her left hand is swollen. Pt with questions about her INR and bathing instructions at d/c. Rn made aware of these issues   Mobility   Progressive Mobility Level 8   Level of Assistance Assist X1   Assistive Device Walker   Time Tolerated 0-10 minutes   Activity Limited By Patient request to stop

## 2017-02-03 NOTE — Progress Notes
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    COPD (chronic obstructive pulmonary disease) (HCC)    Hypertension    GERD (gastroesophageal reflux disease)    Nonrheumatic aortic valve stenosis    LBBB (left bundle branch block)    Obesity, Class III, BMI 40-49.9 (morbid obesity) (HCC)    Iron deficiency anemia    Severe aortic stenosis    Coronary artery disease involving native coronary artery of native heart without angina pectoris    Chronic respiratory failure with hypoxia, on home oxygen therapy (HCC)    Chronic bilateral thoracic back pain    Chronic anticoagulation    Chronic HFrEF (heart failure with reduced ejection fraction) (HCC)    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    Neuro: Prn pain meds; home elavil.   Cardiac: s/p TAVR, AFib with LBBB, no pacing requirements. IV lasix today, oral tomorrow, Restart home coreg and multaq. EF 30-40%. Asa/atorva.  Pulmonary: On NC, encourage good pulmonary hygiene. Continue home inhalers. OOBTC/Ambulate.  FEN: ADAT, bowel regimen  ID:  urine culture with E.coli now s/p 3 doses ancef.  Renal:  Normal renal function preop, monitor urine output. Lasix today.  Heme:  Chronic anemia, largely stable. Cont coumadin tonight.  Endo:  SSI PRN   Prophylaxis: HOB>40, DVT prophylaxis per primary team  Disposition/Family: Likely stable for transfer to floor __________________________________________________________________________________  Subjective:  Michelle Solis is a 76 y.o. female.  Overnight Events: Atrial arrhythmia.  Patient Feeling well today, minimal pain, questions about going home.  Objective:  Medications:  Scheduled Meds:    albuterol 0.5% (PROVENTIL; VENTOLIN) nebulizer solution 2.5 mg 2.5 mg Inhalation BID & PRN   amitriptyline (ELAVIL) tablet 50 mg 50 mg Oral QHS   aspirin chewable tablet 81 mg 81 mg Oral QDAY   atorvastatin (LIPITOR) tablet 40 mg 40 mg Oral QDAY   budesonide respule (PULMICORT) nebulizer solution 0.25 mg 0.25 mg Inhalation BID   carvedilol (COREG) tablet 6.25 mg 6.25 mg Oral BID   dronedarone (MULTAQ) tablet 400 mg 400 mg Oral BID w/meals   ferrous sulfate (FEOSOL, FEROSUL) tablet 325 mg 325 mg Oral BID   furosemide (LASIX) tablet 40 mg 40 mg Oral QAM8   insulin aspart U-100 (NOVOLOG FLEXPEN) injection PEN 0-14 Units 0-14 Units Subcutaneous ACHS   pantoprazole DR (PROTONIX) tablet 80 mg 80 mg Oral QDAY(21)   polyethylene glycol 3350 (MIRALAX) packet 17 g 1 packet Oral QDAY   senna/docusate (SENOKOT-S) tablet 2 tablet 2 tablet Oral BID   tiotropium (SPIRIVA) capsule for inhaler 1 capsule 1 capsule Inhalation QDAY  warfarin (COUMADIN) tablet 2 mg 2 mg Oral QHS   Continuous Infusions:    PRN and Respiratory Meds:acetaminophen Q6H PRN, [START ON 02/03/2017] bisacodyl QDAY PRN, hydrALAZINE Q6H PRN, milk of magnesia (CONC) QDAY PRN, ondansetron (ZOFRAN) IV Q6H PRN, oxyCODONE Q4H PRN, potassium chloride SR PRN **OR** potassium chloride PRN                     Vital Signs: Last Filed                  Vital Signs: 24 Hour Range   BP: 104/55 (11/29 1843)  ABP: 130/59 (11/29 0900)  Temp: 37.2 ???C (99 ???F) (11/29 1843)  Pulse: 83 (11/29 1843)  Respirations: 18 PER MINUTE (11/29 1843)  SpO2: 93 % (11/29 1843)  O2 Delivery: Nasal Cannula (11/29 1843)  Height: 165.1 cm (65) (11/29 0928) Weight: 128.3 kg (282 lb 13.6 oz) (11/29 0928)  BP: (104-130)/(53-72)   ABP: (110-144)/(49-60)   Temp:  [36.6 ???C (97.9 ???F)-37.2 ???C (99 ???F)]   Pulse:  [75-95]   Respirations:  [9 PER MINUTE-18 PER MINUTE]   SpO2:  [93 %-100 %]   O2 Delivery: Nasal Cannula    Intensity Pain Scale (Self Report): (not recorded) Vitals:    02/01/17 0630 02/02/17 0600 02/02/17 0928   Weight: 128.9 kg (284 lb 2.8 oz) 128.3 kg (282 lb 13.6 oz) 128.3 kg (282 lb 13.6 oz)       Critical Care Vitals:      ICP Monitoring:     PA  Catheter:     Hemodynamics/Oxycalcs:       Intake/Output Summary:  (Last 24 hours)    Intake/Output Summary (Last 24 hours) at 02/02/17 2003  Last data filed at 02/02/17 1730   Gross per 24 hour   Intake             1460 ml   Output             2500 ml   Net            -1040 ml         Physical Exam:  General:  no distress, appears stated age  Lungs:  Diminished breath sounds, no wheezing  CV: irregularly irregular  Abdomen:  Soft, non-tender. Obese. Bowel sounds normal.  No masses.  No organomegaly.  Extremities:  Extremities normal, atraumatic, no cyanosis or edema  Neurologic:  CNII - XII intact.  PERRL      Prophylaxis Review:  Lines:  Yes; Arterial Line; Indication:  Frequent blood draws and Continuous BP monitoring; Location:  Radial dc today  Central Line; Indication:  Hemodynamic monitoring; Type:  Internal jugular dc today  Urinary Catheter:  None  Antibiotic Usage:  None  VTE: SCD    Lab Review:  Pertinent labs reviewed  Point of Care Testing:  (Last 24 hours):  Glucose: (!) 118 (02/02/17 0340)  POC Glucose (Download): (!) 124 (02/02/17 1704)    Radiology and Other Diagnostic Procedures Review:    Pertinent radiology reviewed.    I have seen, examined and reviewed data concerning this patient.  I discussed the findings and plan of care with the ICU team. I spent 35 minutes in critical care time, excluding procedures today.    Farley Ly, MD  Anesthesiology and Critical Care  [phone]

## 2017-02-03 NOTE — Progress Notes
Pt arrived to CTP at 1230.  SR on tele c BBB, PACs, VSS per trend.  A*Ox4, tolerating 2L NC, adequate UOP, no BM.  Incisions CDI, WV to -125 with no measurable output.   Ambulated in the halls with assist x1 on three occassions.     Prevena Plus home WV placed in pt's room.   No other needs at this time, will continue to monitor.

## 2017-02-03 NOTE — Progress Notes
CARDIOTHORACIC SURGERY DAILY PROGRESS NOTE    PROCEDURE: PERCUTANEOUS TRANSCATHETER REPLACEMENT AORTIC VALVE - EVOLUT 26 PRO, LEFT COMMON FEMORAL ARTERY APPROACH: 33361 (CPT???)    POD #: 2    SUBJECTIVE:   Overnight events: Transferred to floor from CTI. No acute overnight events.     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    COPD (chronic obstructive pulmonary disease) (HCC)    Hypertension    GERD (gastroesophageal reflux disease)    Nonrheumatic aortic valve stenosis    LBBB (left bundle branch block)    Obesity, Class III, BMI 40-49.9 (morbid obesity) (HCC)    Iron deficiency anemia    Severe aortic stenosis    Coronary artery disease involving native coronary artery of native heart without angina pectoris    Chronic respiratory failure with hypoxia, on home oxygen therapy (HCC)    Chronic bilateral thoracic back pain    Chronic anticoagulation    Chronic HFrEF (heart failure with reduced ejection fraction) (HCC)    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 Pain controlled with APAP. PTA amitriptyline QHS.   CV ??? SR 70s-80s with LBBB. SBP 100s. Cont ASA 81 mg, statin, PTA multaq. Started on Coreg 6.25mg  yesterday (on 12.5mg  PTA)-refused pm dose. Held this am for hypotension. Continue to hold PTA lisinopril. Intra op TEE LVEF 30-40%. POD1 echo yesterday: EF 60%, well seated bioprosthetic aortic valve, small pericardial effusion.   Resp ??? On 2.5L O2. Daily CXR ??? persistent congestion with chronic disease, atelectasis. On home 02 at 2L. Cont IS, aggressive pulm toilet. Wean O2 as able.   Renal ??? Creatinine 0.98. UOP 2.1L/24hrs. Foley out. PTA Lasix 40mg  PO daily. Lasix 40mg  IV x 1 yesterday. Refusing IV Lasix this am.  GI - Cardiac/diabetic diet. Continue post op bowel regimen. PTA PPI.   ID ??? WBC 7.9. Afebrile. Heme ??? Hgb 9.1, continue to monitor acute blood loss anemia. Coumadin for Afib. On home dose 2mg  QHS. INR 1.4.  FEN ??? No h/o DM, A1c 6.0. Continue SSI.   Activity ??? Walked 160' yesterday. Continue TID ambulation.   Disposition - Hold BB, lisinopril. Continue Coumadin. Ambulate. Plan to D/C home with Prevena wound vac-follow up in clinic 12/7 for dressing change. Discuss with staff.     OBJECTIVE:  Vitals:    02/02/17 2103 02/02/17 2300 02/03/17 0513 02/03/17 0600   BP:  103/53 106/52    Pulse: 82 82 70    Temp:  36.6 ???C (97.9 ???F) 36.3 ???C (97.3 ???F)    SpO2:  96% 95%    Weight:    129.8 kg (286 lb 3.2 oz)   Height:           Physical Exam:  General: A&O x 3, NAD, seated in chair  Cardiovascular: RRR no rub or murmur  Respiratory: LS CTA bil, diminished in bases  GI: obese, soft, NT, +BS  Extremities: 1+ pedal edema  Incisions: Wound vac in place to left groin, dressing intact. +ecchymosis. Plan to change to [facility] today.     Prophylaxis Review:  Lines:  No  Antibiotic Usage:  No  VTE:  Pharmacological prophylaxis; Warfarin and Mechanical prophylaxis; Sequential compression device  Urinary Catheter: No    Pertinent Meds:  Taking    Reason for Not Taking  1. Aspirin yes    2. B-Blocker no LBBB, hypotension   3. Statin yes    4. ACE/ARB no Hypotension      Beta blocker held due to SBP less than 110.    LABS:  Lab Results   Component Value Date/Time    WBC 7.9 02/03/2017 04:11 AM    HGB 9.1 (L) 02/03/2017 04:11 AM    HCT 29.2 (L) 02/03/2017 04:11 AM    PLTCT 214 02/03/2017 04:11 AM          Lab Results   Component Value Date/Time    NA 138 02/03/2017 04:11 AM    K 4.4 02/03/2017 04:11 AM    CL 105 02/03/2017 04:11 AM    CO2 29 02/03/2017 04:11 AM    BUN 17 02/03/2017 04:11 AM    CR 0.98 02/03/2017 04:11 AM    GLU 111 (H) 02/03/2017 04:11 AM    Lab Results   Component Value Date    MG 2.1 02/01/2017     Lab Results   Component Value Date    PO4 4.3 (H) 06/14/2013           Lab Results   Component Value Date GLUPOC 120 (H) 02/02/2017    GLUPOC 124 (H) 02/02/2017    GLUPOC 99 02/02/2017    GLUPOC 128 (H) 02/02/2017    GLUPOC 203 (H) 02/01/2017    GLUPOC 170 (H) 02/01/2017    GLUPOC 128 (H) 02/01/2017       Marsha Hillman, PA-C  [phone]

## 2017-02-03 NOTE — Progress Notes
Assumed pt care at 1900.  Pt AOx4; VSS per trend; NSR c LBBB; 2L NC with adequate O2 sats.  Pt voids c adequate UOP; LBM 11-29.  Complaints of pain localized to low back that pt states is chronic in nature. Pt denies any pain at bilateral groin sites.   Bilateral groin sites C/D/I c WV to left groin set at -125 --> no output overnight.   No acute events noted overnight. Pt planned DC home with with woundvac 11-30.  Will continue to monitor and update accordingly.

## 2017-02-06 ENCOUNTER — Encounter: Admit: 2017-02-06 | Discharge: 2017-02-06 | Payer: MEDICARE

## 2017-02-06 DIAGNOSIS — I10 Essential (primary) hypertension: ICD-10-CM

## 2017-02-06 DIAGNOSIS — Z953 Presence of xenogenic heart valve: ICD-10-CM

## 2017-02-06 DIAGNOSIS — I350 Nonrheumatic aortic (valve) stenosis: Principal | ICD-10-CM

## 2017-02-06 NOTE — Telephone Encounter
Patient Discharge Date:  11/30    Surgeon: Ky BarbanMuehlebach     Surgery: TAVR    Spoke with patient regarding status post-discharge. She states she is doing well. She is using cloth to keep her groin puncture site clean dry, she continues to have the wound vac dressing on her cut down site. She states her vitals have been BP 110s-130s/60s-70s, HR 90s, her weight is down from 284 lbs to 280 lbs. She states she is ambulating to and from the bathroom/kitchen however is unable to do more due to her back pain, discussed activity goals. She states HH is set up to come today and they will draw her INR, which will be managed by the same office that managed prior to surgery. Patient denies any issues/questions at this time.

## 2017-02-09 NOTE — Progress Notes
Cardiac Rehab Call Back Note:    Are you tolerating activity?Yes  Is pain controlled?Yes  Is appetite normal?Yes  Are you having symptoms of heart discomfort?No  Are you having signs of infection at your incision sites or groin site?No  Do you want outpt cardiac rehab?Yes

## 2017-02-10 ENCOUNTER — Ambulatory Visit: Admit: 2017-02-10 | Discharge: 2017-02-11 | Payer: MEDICARE

## 2017-02-10 NOTE — Progress Notes
Previna wound vac removed. Cut down site well approximated. No bleeding or drainage from site. Covered with gauze. Instructed to shower and wash area thoroughly and cover with gauze for any drainage.

## 2017-02-16 ENCOUNTER — Inpatient Hospital Stay: Admit: 2017-02-17 | Discharge: 2017-02-23 | Disposition: A | Discharge status: Home Health | Payer: MEDICARE

## 2017-02-16 ENCOUNTER — Encounter: Admit: 2017-02-16 | Discharge: 2017-02-17 | Payer: MEDICARE

## 2017-02-16 ENCOUNTER — Encounter: Admit: 2017-02-16 | Discharge: 2017-02-16 | Payer: MEDICARE

## 2017-02-16 DIAGNOSIS — T8141XA Infection following a procedure, superficial incisional surgical site, initial encounter: Principal | ICD-10-CM

## 2017-02-16 DIAGNOSIS — I480 Paroxysmal atrial fibrillation: Secondary | ICD-10-CM

## 2017-02-16 DIAGNOSIS — R69 Illness, unspecified: Principal | ICD-10-CM

## 2017-02-16 DIAGNOSIS — T8149XA Infection following a procedure, other surgical site, initial encounter: ICD-10-CM

## 2017-02-16 MED ORDER — ASPIRIN 81 MG PO TBEC
81 mg | Freq: Every day | ORAL | 0 refills | Status: DC
Start: 2017-02-16 — End: 2017-02-23
  Administered 2017-02-17 – 2017-02-23 (×7): 81 mg via ORAL

## 2017-02-16 MED ORDER — NYSTATIN 100,000 UNIT/GRAM TP POWD
Freq: Two times a day (BID) | TOPICAL | 0 refills | Status: DC
Start: 2017-02-16 — End: 2017-02-23
  Administered 2017-02-17: 07:00:00 via TOPICAL

## 2017-02-16 MED ORDER — PANTOPRAZOLE 20 MG PO TBEC
40 mg | Freq: Every day | ORAL | 0 refills | Status: DC
Start: 2017-02-16 — End: 2017-02-23
  Administered 2017-02-17 – 2017-02-23 (×7): 40 mg via ORAL

## 2017-02-16 MED ORDER — ONDANSETRON HCL (PF) 4 MG/2 ML IJ SOLN
4 mg | INTRAVENOUS | 0 refills | Status: DC | PRN
Start: 2017-02-16 — End: 2017-02-23

## 2017-02-16 MED ORDER — BUDESONIDE 0.25 MG/2 ML IN NBSP
0.25 mg | Freq: Two times a day (BID) | RESPIRATORY_TRACT | 0 refills | Status: DC
Start: 2017-02-16 — End: 2017-02-23
  Administered 2017-02-17 – 2017-02-23 (×10): 0.25 mg via RESPIRATORY_TRACT

## 2017-02-16 MED ORDER — FUROSEMIDE 40 MG PO TAB
40 mg | Freq: Every day | ORAL | 0 refills | Status: DC
Start: 2017-02-16 — End: 2017-02-20
  Administered 2017-02-17 – 2017-02-20 (×4): 40 mg via ORAL

## 2017-02-16 MED ORDER — ACETAMINOPHEN 325 MG PO TAB
650 mg | ORAL | 0 refills | Status: DC | PRN
Start: 2017-02-16 — End: 2017-02-23
  Administered 2017-02-17 – 2017-02-23 (×6): 650 mg via ORAL

## 2017-02-16 MED ORDER — ALBUTEROL SULFATE 2.5 MG/0.5 ML IN NEBU
2.5 mg | Freq: Two times a day (BID) | RESPIRATORY_TRACT | 0 refills | Status: DC | PRN
Start: 2017-02-16 — End: 2017-02-17
  Administered 2017-02-17: 10:00:00 2.5 mg via RESPIRATORY_TRACT

## 2017-02-16 MED ORDER — IPRATROPIUM BROMIDE 0.02 % IN SOLN
0.5 mg | Freq: Two times a day (BID) | RESPIRATORY_TRACT | 0 refills | Status: DC | PRN
Start: 2017-02-16 — End: 2017-02-17
  Administered 2017-02-17: 10:00:00 0.5 mg via RESPIRATORY_TRACT

## 2017-02-16 MED ORDER — OXYCODONE 5 MG PO TAB
5 mg | ORAL | 0 refills | Status: DC | PRN
Start: 2017-02-16 — End: 2017-02-23

## 2017-02-16 MED ORDER — FERROUS SULFATE 325 MG (65 MG IRON) PO TAB
325 mg | Freq: Two times a day (BID) | ORAL | 0 refills | Status: DC
Start: 2017-02-16 — End: 2017-02-23
  Administered 2017-02-17 – 2017-02-23 (×13): 325 mg via ORAL

## 2017-02-16 MED ORDER — FLUTICASONE 50 MCG/ACTUATION NA SPSN
2 | Freq: Every day | NASAL | 0 refills | Status: DC
Start: 2017-02-16 — End: 2017-02-23
  Administered 2017-02-19: 15:00:00 2 via NASAL

## 2017-02-16 MED ORDER — CEFAZOLIN INJ 1GM IVP
2 g | INTRAVENOUS | 0 refills | Status: DC
Start: 2017-02-16 — End: 2017-02-17
  Administered 2017-02-17 (×2): 2 g via INTRAVENOUS

## 2017-02-16 MED ORDER — TIOTROPIUM BROMIDE 18 MCG IN CPDV
1 | Freq: Every day | RESPIRATORY_TRACT | 0 refills | Status: DC
Start: 2017-02-16 — End: 2017-02-23
  Administered 2017-02-17 – 2017-02-22 (×2): 1 via RESPIRATORY_TRACT

## 2017-02-16 MED ORDER — AMITRIPTYLINE 25 MG PO TAB
50 mg | Freq: Every evening | ORAL | 0 refills | Status: DC
Start: 2017-02-16 — End: 2017-02-23
  Administered 2017-02-17 – 2017-02-22 (×6): 50 mg via ORAL

## 2017-02-17 LAB — BASIC METABOLIC PANEL
Lab: 136 MMOL/L — ABNORMAL LOW (ref >60)
Lab: 3.9 MMOL/L (ref >60)
Lab: 103 MMOL/L (ref 98–110)
Lab: 136 MMOL/L — ABNORMAL LOW (ref 137–147)
Lab: 14 mg/dL (ref 7–25)
Lab: 27 MMOL/L (ref 21–30)
Lab: 3.5 MMOL/L (ref 3.5–5.1)
Lab: 6 (ref 3–12)
Lab: 8.7 mg/dL (ref 8.5–10.6)
Lab: >60 mL/min (ref >60)

## 2017-02-17 LAB — PTT (APTT): Lab: 24 s — ABNORMAL LOW (ref 24.0–36.5)

## 2017-02-17 LAB — URINALYSIS DIPSTICK REFLEX TO CULTURE
Lab: NEGATIVE
Lab: NEGATIVE
Lab: NEGATIVE
Lab: NEGATIVE
Lab: NEGATIVE

## 2017-02-17 LAB — CBC AND DIFF
Lab: 0 % (ref 0–2)
Lab: 0 % (ref 0–5)
Lab: 0.3 10*3/uL — ABNORMAL LOW (ref 1.0–4.8)
Lab: 3 % — ABNORMAL LOW (ref 24–44)
Lab: 3.1 M/UL — ABNORMAL LOW (ref 4.0–5.0)
Lab: 4 % (ref 4–12)
Lab: 8.3 10*3/uL — ABNORMAL HIGH (ref 1.8–7.0)
Lab: 8.9 10*3/uL (ref 4.5–11.0)

## 2017-02-17 LAB — MAGNESIUM: Lab: 2.1 mg/dL — ABNORMAL LOW (ref >60)

## 2017-02-17 LAB — URINALYSIS MICROSCOPIC REFLEX TO CULTURE

## 2017-02-17 LAB — CBC: Lab: 7.7 K/UL (ref 4.5–11.0)

## 2017-02-17 LAB — PROTIME INR (PT): Lab: 2.7 g/dL — ABNORMAL HIGH (ref 0.8–1.2)

## 2017-02-17 MED ORDER — DRONEDARONE 400 MG PO TAB
400 mg | Freq: Two times a day (BID) | ORAL | 0 refills | Status: DC
Start: 2017-02-17 — End: 2017-02-19
  Administered 2017-02-17 – 2017-02-19 (×5): 400 mg via ORAL

## 2017-02-17 MED ORDER — PERFLUTREN LIPID MICROSPHERES 1.1 MG/ML IV SUSP
1-20 mL | Freq: Once | INTRAVENOUS | 0 refills | Status: CP
Start: 2017-02-17 — End: ?
  Administered 2017-02-17: 20:00:00 2 mL via INTRAVENOUS

## 2017-02-17 MED ORDER — WARFARIN 1 MG PO TAB
1 mg | Freq: Every evening | ORAL | 0 refills | Status: DC
Start: 2017-02-17 — End: 2017-02-21
  Administered 2017-02-18 – 2017-02-21 (×4): 1 mg via ORAL

## 2017-02-17 MED ORDER — ALBUTEROL SULFATE 2.5 MG/0.5 ML IN NEBU
2.5 mg | Freq: Four times a day (QID) | RESPIRATORY_TRACT | 0 refills | Status: DC | PRN
Start: 2017-02-17 — End: 2017-02-19
  Administered 2017-02-18 – 2017-02-19 (×2): 2.5 mg via RESPIRATORY_TRACT

## 2017-02-17 MED ORDER — VANCOMYCIN 2,000 MG IVPB
15 mg/kg | Freq: Two times a day (BID) | INTRAVENOUS | 0 refills | Status: DC
Start: 2017-02-17 — End: 2017-02-17
  Administered 2017-02-17 (×2): 2000 mg via INTRAVENOUS

## 2017-02-17 MED ORDER — VANCOMYCIN 1,750 MG IVPB
1750 mg | Freq: Two times a day (BID) | INTRAVENOUS | 0 refills | Status: DC
Start: 2017-02-17 — End: 2017-02-19
  Administered 2017-02-18 – 2017-02-19 (×6): 1750 mg via INTRAVENOUS

## 2017-02-17 MED ORDER — IPRATROPIUM BROMIDE 0.02 % IN SOLN
0.5 mg | Freq: Four times a day (QID) | RESPIRATORY_TRACT | 0 refills | Status: DC | PRN
Start: 2017-02-17 — End: 2017-02-19
  Administered 2017-02-18: 05:00:00 0.5 mg via RESPIRATORY_TRACT

## 2017-02-17 MED ORDER — ATORVASTATIN 40 MG PO TAB
40 mg | Freq: Every day | ORAL | 0 refills | Status: DC
Start: 2017-02-17 — End: 2017-02-23
  Administered 2017-02-17 – 2017-02-23 (×7): 40 mg via ORAL

## 2017-02-17 MED ORDER — VANCOMYCIN PHARMACY TO MANAGE
1 | 0 refills | Status: DC
Start: 2017-02-17 — End: 2017-02-21

## 2017-02-17 NOTE — H&P (View-Only)
CTS H&P  Date of Service: 02/16/2017    Admitting Physician: Dr. Lynne Logan MD    Admission H&P Performed By: Leland Her APRN    Chief Complaint:  The patient states 'I think my groin is infected.     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  She underwent successful TAVR with Dr. Ky Barban on 02/01/2017.  She was recovering at home with the use of a superficial wound VAC to the LT femoral cutdown site until 02/10/2017.  Last night 12/12 she reports fever to 102 with associated chills, sweats, and general malaise.  Subsequently she also is reporting the LT femoral wound to feel hot with associated redness, swelling and is painful touch.  She presented to the ER of an outside hospital where she was given antibiotics,  analgesics and subsequently transferred to [facility] for further evaluation of the wound.      She arrives in NAD.  She is home O2 dependant and has moderated orthopnea.  The LT femoral wound shows a 10cm x 2 cm dense non-pulsatile mass just superficial to the incision.  There is no erythema or cellulitis.  The wound is approximate without drainage.  It is however painful to touch.  The distal pulses are strong by doppler.  The pannus folds were noted to be be moist with mild erythema with a strong odor of yeast.  Vital signs are stable and there is no fever.  She is accompanied by her family, all questions were answered.       Past Medical History:   Diagnosis Date   ??? Abnormal chest CT    ??? Aortic stenosis    ??? Atrial flutter (HCC)    ??? Cerebral aneurysm rupture (HCC) 1998   ??? COPD (chronic obstructive pulmonary disease) (HCC)    ??? Dyspnea    ??? GERD (gastroesophageal reflux disease) 09/13/2011   ??? History of rheumatic fever during childhood   ??? Hypertension 09/13/2011   ??? O2 dependent    ??? Seizures (HCC)     off medications since 2000       PSH:  Past Surgical History: Procedure Laterality Date   ??? HX ENDOVASCULAR ANEURYSM REPAIR  1999    coiling   ??? APPENDECTOMY     ??? BACK SURGERY      low lumbar back rod and screws    ??? CHOLECYSTECTOMY     ??? FEMUR SURGERY      rod inserted   ??? HX HEART CATHETERIZATION     ??? HYSTERECTOMY     ??? KNEE REPLACEMENT     ??? TONSILLECTOMY          Medicatons:    albuterol 0.5% (PROVENTIL; VENTOLIN) nebulizer solution 2.5 mg 2.5 mg Inhalation BID & PRN   amitriptyline (ELAVIL) tablet 50 mg 50 mg Oral QHS   aspirin EC tablet 81 mg 81 mg Oral QDAY   budesonide respule (PULMICORT) nebulizer solution 0.25 mg 0.25 mg Inhalation BID   ceFAZolin (ANCEF) IVP 2 g 2 g Intravenous Q8H*   ferrous sulfate (FEOSOL, FEROSUL) tablet 325 mg 325 mg Oral BID   fluticasone (FLONASE) nasal spray 2 spray 2 spray Each Nostril QDAY   furosemide (LASIX) tablet 40 mg 40 mg Oral QDAY   ipratropium bromide (ATROVENT) 0.02 % nebulizer solution 0.5 mg 0.5 mg Inhalation BID &  PRN   nystatin (NYSTOP) topical powder  Topical BID   pantoprazole DR (PROTONIX) tablet 40 mg 40 mg Oral QDAY(21)   tiotropium (SPIRIVA) capsule for inhaler 1 capsule 1 capsule Inhalation QDAY       Allergies:  Allergies   Allergen Reactions   ??? Avelox [Moxifloxacin] UNKNOWN   ??? Ciprofloxacin UNKNOWN   ??? Sulfa (Sulfonamide Antibiotics) UNKNOWN       Family History:  Family History   Problem Relation Age of Onset   ??? Cancer Mother         pancreas   ??? Suicide Father    ??? COPD Sister    ??? Diabetes Daughter        Social History:  Social History     Socioeconomic History   ??? Marital status: Divorced     Spouse name: Not on file   ??? Number of children: Not on file   ??? Years of education: Not on file   ??? Highest education level: Not on file   Social Needs   ??? Financial resource strain: Not on file   ??? Food insecurity - worry: Not on file   ??? Food insecurity - inability: Not on file   ??? Transportation needs - medical: Not on file   ??? Transportation needs - non-medical: Not on file   Occupational History ??? Occupation: worked as Merchandiser, retail in prison   ??? Occupation: retired since 1998   Tobacco Use   ??? Smoking status: Former Smoker     Packs/day: 3.00     Years: 35.00     Pack years: 105.00     Types: Cigarettes     Last attempt to quit: 03/07/1994     Years since quitting: 22.9   ??? Smokeless tobacco: Never Used   ??? Tobacco comment: smoked up to 3 packs a day   Substance and Sexual Activity   ??? Alcohol use: Yes     Comment: occassional   ??? Drug use: No   ??? Sexual activity: Not on file   Other Topics Concern   ??? Not on file   Social History Narrative   ??? Not on file       ROS:  Constitutional: Negative for Fatigue, Weight Change, Positive for fever, chills, and malaise  Eyes, Ears, Nose And Throat: Negative for Change in vision, Change in Hearing   Cardiovascular: Negative for Chest Pain, Palpitations, Positive for Swelling in ankles  Respiratory: Negative for Cough, Positive for Shortness of Breath, wheezing  Gastrointestinal: Negative for Nausea, indigestion, Diarrhea, Constipation, Rectal Bleeding   Neurological: Negative for Headache, Memory problems, Numbness, Muscle Weakness  Psychological: Negative for depression or anxiety  Musculoskeletal: Negative for Pain or Swelling in joints  Genitourinary: Negative for Pain with urination, Incontinence of urine, urinary frequency    Skin: Negative for any unusual rash Positive for femoral wound redness, heat and pain   Endocrine: Negative for Any hair skin or nail changes, Unusual hunger or thirst    Physical Exam:  Temp: 36.8 ???C (98.2 ???F) (12/13 2219)  Pulse: 103 (12/13 2219)  Respirations: 20 PER MINUTE (12/13 2219)  BP: 114/62 (12/13 2219)      GENERAL:   A&O x 3, NAD, obese   HEENT  Head:  Normocephalic   Teeth: Present and in good dentition  NECK  Active ROM: full  Trachea: midline  HEART  Neck Veins- No JVD   Carotid Arteries:  No bruits  Cardiac: irregularly irregular.  Grade III systolic  murmur   LUNGS  Auscultation- equal excursion, bibasilar posterior rales ABDOMEN  Soft, NT + BS Obese   EXTREMITIES  Edema- !+ pitting ankle edema   Posterior Tibial- 2+ bil   Dorsalis Pedis- 2+ bil  SKIN:   Pannus is moist with foul odor, there is mild diffuse erythema of the folds.  RT femoral access site approximate   LT femoral cut down site there is a 10cm by 2 cm thick sponge like mass in the subcutaneous tissues.  There is no dehiscence of the wound, there is no redness, there is no drainage, there is no cellulitis however it is painful to palpate. The mass is not pulsatile.   NEUROLOGIC  A&O x 3  Grossly intact     Results for orders placed or performed during the hospital encounter of 02/16/17 (from the past 24 hour(s))   CBC AND DIFF    Collection Time: 02/16/17  9:53 PM   # # Low-High    White Blood Cells 8.9 4.5 - 11.0 K/UL    RBC 3.17 (L) 4.0 - 5.0 M/UL    Hemoglobin 9.1 (L) 12.0 - 15.0 GM/DL    Hematocrit 16.1 (L) 36 - 45 %    MCV 89.6 80 - 100 FL    MCH 28.6 26 - 34 PG    MCHC 31.9 (L) 32.0 - 36.0 G/DL    RDW 09.6 (H) 11 - 15 %    Platelet Count 215 150 - 400 K/UL    MPV 8.4 7 - 11 FL    Neutrophils 93 (H) 41 - 77 %    Lymphocytes 3 (L) 24 - 44 %    Monocytes 4 4 - 12 %    Eosinophils 0 0 - 5 %    Basophils 0 0 - 2 %    Absolute Neutrophil Count 8.30 (H) 1.8 - 7.0 K/UL    Absolute Lymph Count 0.30 (L) 1.0 - 4.8 K/UL    Absolute Monocyte Count 0.40 0 - 0.80 K/UL    Absolute Eosinophil Count 0.00 0 - 0.45 K/UL    Absolute Basophil Count 0.00 0 - 0.20 K/UL   PROTIME INR (PT)    Collection Time: 02/16/17  9:53 PM   # # Low-High    INR 2.7 (H) 0.8 - 1.2   PTT (APTT)    Collection Time: 02/16/17  9:53 PM   # # Low-High    APTT 24.5 24.0 - 36.5 SEC   URINALYSIS DIPSTICK REFLEX TO CULTURE    Collection Time: 02/16/17 10:56 PM   # # Low-High    Color,UA YELLOW     Turbidity,UA CLEAR CLEAR-CLEAR    Specific Gravity-Urine 1.015 1.003 - 1.035    pH,UA 5.0 5.0 - 8.0    Protein,UA NEG NEG-NEG    Glucose,UA NEG NEG-NEG    Ketones,UA NEG NEG-NEG    Bilirubin,UA NEG NEG-NEG Blood,UA NEG NEG-NEG    Urobilinogen,UA INCREASED (A) NORM-NORMAL    Nitrite,UA NEG NEG-NEG    Leukocytes,UA NEG NEG-NEG    Urine Ascorbic Acid, UA NEG NEG-NEG   URINALYSIS MICROSCOPIC REFLEX TO CULTURE    Collection Time: 02/16/17 10:56 PM   # # Low-High    WBCs,UA 0-2 0 - 2 /HPF    RBCs,UA 0-2 0 - 3 /HPF    Comment,UA       Urine submitted for reflex culture if criteria are met:WBC>10, positive nitrite   and/or >=1+ leukocyte esterase. If quantity is not sufficient, an addendum will  follow.      MucousUA TRACE     Squamous Epithelial Cells 0-2 0 - 5   BASIC METABOLIC PANEL    Collection Time: 02/17/17 12:50 AM   # # Low-High    Sodium 136 (L) 137 - 147 MMOL/L    Potassium 3.5 3.5 - 5.1 MMOL/L    Chloride 103 98 - 110 MMOL/L    CO2 27 21 - 30 MMOL/L    Anion Gap 6 3 - 12    Glucose 139 (H) 70 - 100 MG/DL    Blood Urea Nitrogen 14 7 - 25 MG/DL    Creatinine 1.61 0.4 - 1.00 MG/DL    Calcium 8.7 8.5 - 09.6 MG/DL    eGFR Non African American 58 (L) >60 mL/min    eGFR African American >60 >60 mL/min        All pertinent diagnostic studies have been reviewed.      Impression:  Active Hospital Problems    Diagnosis   ??? Wound infection after surgery   ??? Candidal dermatitis   ??? Chronic anticoagulation   ??? Chronic HFrEF (heart failure with reduced ejection fraction) (HCC)   ??? S/P TAVR (transcatheter aortic valve replacement)   ??? Bronchiectasis (HCC)   ??? Chronic respiratory failure with hypoxia, on home oxygen therapy (HCC)     2L NC     ??? 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 Obesity, Class III, BMI 40-49.9 (morbid obesity) (HCC)   ??? Chronic A-fib [facility])     Diagnosed June 2013     ??? COPD (chronic obstructive pulmonary disease) (HCC)        Plan:  The care of the patient occurred on 02/16/2017.    The case was discussed with CTS attending Dr. Elias Else.  The plan of care is as follows:    1. Admit to tele 2. Check CBC, Chem, INR, ptt, CXR, and blood cultures.  3. Check duplex doppler of the LT femoral artery  4. Start empiric Ancef   5. Check ECHO in the am R/O endocarditis   6. NPO after MN for possible washout of wound   7. Hold coumadin for now   8. Nystatin to folds

## 2017-02-17 NOTE — Progress Notes
Pt. Was recently discharged after having a TAVR.  Pt. Presented to ED today with red/warm/swelling in left groin.  Pt. Was also febrile/achy/chilling.  WBC = 13, lactate = 1.4.  Flu swab negative.  VSS, pt. Is requiring 5L 02, baseline is 2L 02.  Sending MD to start an antibiotic prior to transfer.

## 2017-02-17 NOTE — Progress Notes
Patient arrived to room # 421 via cart  accompanied by transport. Patient transferred to bed with assistance. Bedside safety checks completed. Initial patient assessment completed, refer to flowsheet for details. Admission skin assessment completed by:     Pressure Injury Present on Hospital Admission (within 24 hours):  No    1. Occiput: No  2. Ear: No  3. Scapula: No  4. Spinous Process: No  5. Shoulder: No  6. Elbow: No  7. Iliac Crest: No  8. Sacrum/Coccyx: No  9. Ischial Tuberosity: No  10. Trochanter: No  11. Knee: No  12. Malleolus: No  13. Heel: No  14. Toes: No  15. Assessed for device associated injury: No  16. Nursing Nutrition Assessment Completed:yes    See Doc Flowsheet for additional wound details.     INTERVENTIONS:

## 2017-02-17 NOTE — Progress Notes
CARDIOTHORACIC SURGERY DAILY PROGRESS NOTE    PROCEDURE: TAVR right groin cutdown 11/28 Dr Darlyne Russian.     SUBJECTIVE:   Overnight events: Went home with Provena superficial wound vac due to body habitus and home health. She states that she had a fever recently as high as 102. Her Provena was removed 12/7. She has remained afebrile here without leukocytosis.     ASSESSMENT:  Active Problems:    Chronic A-fib (HCC)    COPD (chronic obstructive pulmonary disease) (HCC)    Obesity, Class III, BMI 40-49.9 (morbid obesity) (HCC)    Coronary artery disease involving native coronary artery of native heart without angina pectoris    Chronic respiratory failure with hypoxia, on home oxygen therapy (HCC)    Bronchiectasis (HCC)    Chronic anticoagulation    S/P TAVR (transcatheter aortic valve replacement)    Chronic HFrEF (heart failure with reduced ejection fraction) (HCC)    Wound infection after surgery    Candidal dermatitis      PLAN:  1. CV - Rhythm sinus tachy low 100's. BP 115-140's. Cont ASA. Not on BB. Chronic A fib. PTA Multaq and coumadin. INR on admission was 2.7.   2. Resp - CXR reviewed. Unremarkable. Cont IS, wean O2, aggressive pulm toilet.  3. Renal - creatinine stable 0.92. UOP 550/24hrs. Lasix 40mg  QD   4. GI - no BM since surgery, cont bowel regimen  5. ID/Heme - WBC 7.7. Afebrile. Hgb 9.0  6. Endo - FSBS stable on current regimen.   7. Activity - Cardiac rehab TID. Up ad lib  8. Disposition - Left groin incision has healed. No drainage noted. Will try ABX (vanco) for cellulitis and avid opening the incision at this time. This is very difficult area to heal due to her body habitus.   Restart Multaq and coumadin 1mg    INR check tomorrow         OBJECTIVE:  Vitals:    02/17/17 0100 02/17/17 0334 02/17/17 0335 02/17/17 0342   BP:   142/63    Pulse: 99 106 103 110   Temp:   37.2 ???C (98.9 ???F)    SpO2:  95% 94%    Weight:       Height:           Physical Exam:  General: A&O x 3 Cardiovascular: RRR no rub or murmur  Respiratory: LS CTA bil  GI: soft, NT, +BS  Extremities: No Edema  Incisions: left groin incision is healed. No drainage. Minimal erythema. No fluctuance noted         LABS:  Lab Results   Component Value Date/Time    WBC 7.7 02/17/2017 04:34 AM    HGB 9.0 (L) 02/17/2017 04:34 AM    HCT 28.7 (L) 02/17/2017 04:34 AM    PLTCT 243 02/17/2017 04:34 AM          Lab Results   Component Value Date/Time    NA 136 (L) 02/17/2017 04:34 AM    K 3.9 02/17/2017 04:34 AM    CL 104 02/17/2017 04:34 AM    CO2 26 02/17/2017 04:34 AM    BUN 13 02/17/2017 04:34 AM    CR 0.92 02/17/2017 04:34 AM    GLU 149 (H) 02/17/2017 04:34 AM    Lab Results   Component Value Date    MG 2.1 02/17/2017     Lab Results   Component Value Date    PO4 4.3 (H) 06/14/2013  Lab Results   Component Value Date    GLUPOC 128 (H) 02/03/2017    GLUPOC 117 (H) 02/03/2017    GLUPOC 120 (H) 02/02/2017    GLUPOC 124 (H) 02/02/2017    GLUPOC 99 02/02/2017    GLUPOC 128 (H) 02/02/2017    GLUPOC 203 (H) 02/01/2017    GLUPOC 170 (H) 02/01/2017       Icel Castles, PA-C  [phone]

## 2017-02-17 NOTE — Progress Notes
RT Adult Assessment Note    NAME:Almeda Abner GreenspanG Mccroskey             MRN: [id number]             DOB:1940-03-10          AGE: 76 y.o.  ADMISSION DATE: 02/16/2017             DAYS ADMITTED: LOS: 1 day    RT Treatment Plan:   A/A Neb BID, Pulmicort BID, Pulse Ox monitoring BID, Spiriva QDay          Additional Comments:  Impressions of the patient: pt was labored after getting up to use the restroom.   Intervention(s)/outcome(s): Continue home medication regimen.   Patient education that was completed: N/A  Recommendations to the care team: N/A    Vital Signs:  Pulse: Pulse: 110  RR: Respirations: 20 PER MINUTE  SpO2: SpO2: 95 %  O2 Device: $$ O2 Device: Cannula  Liter Flow: O2 Liter Flow: 2 lpm  O2%:    Breath Sounds:  Clear in upper lobes, diminished in bases.   Respiratory Effort: Respiratory Effort: Labored

## 2017-02-17 NOTE — Progress Notes
Readmitted overnight for fevers and concerns of a groin infection at TAVR cutdown site.  Reports that over time it has become more erythematous and remains tender to palpation (although her tenderness has improved); her wound vac was removed one week ago in clinic.  On exam today a healing ridge is present at her incision, which is well approximated, and there is no drainage or obvious fluid collection; the wound is erythematous and blanching down to the pubic tubercle. Given her body habitus, would attempt IV antibiotic therapy for now as this appears more cellulitic in nature.  She may still ultimately require drainage if her cellulitis does not improve with antibiotics.      Oneal GroutAshley Sehaj Mcenroe MD

## 2017-02-17 NOTE — Case Management (ED)
Case Management Admission Assessment    NAME:Michelle Solis                          MRN: [id number]             DOB:[birth year]          AGE: 76 y.o.  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  She underwent successful TAVR with Dr. Ky Barban on 02/01/2017.  She was recovering at home with the use of a superficial wound VAC to the LT femoral cutdown site until 02/10/2017.  Last night 12/12 she reports fever to 102 with associated chills, sweats, and general malaise.  Subsequently she also is reporting the LT femoral wound to feel hot with associated redness, swelling and is painful touch.  She presented to the ER of an outside hospital where she was given antibiotics,  analgesics and subsequently transferred to [facility] for further evaluation of the wound.      Pt is currently LOS 1.  No drainage noted from groin site.  Team to try abx for cellulitis and avoid opening the incision site at this time.  Pt to be restarted on Multaq and coumadin 1mg .  INR to be checked tomorrow.      SW met with pt in room this afternoon to complete an initial assessment and discuss her role in dc planning.  Pt was laying in bed when SW entered room.  SW introduced herself and discussed her role in dc planning.  Pt agreeable to meeting with SW.  Pt reports she lives alone in her one level home in [place], [place].  Her dgt Pam lives in town as well and is a good support for her.  Pt says her son Annette Stable lives in [place], [place] not far from her and he can help when he is available.  He will likely be her transportation home.  Pt was on service with [facility] PTA but denies the need to resume care with them because she feels they were not assisting her properly.  She told SW she was concerned about her incision site for several days and the [facility] RN allegedly said she had nothing to worry about.  Pt was receiving INR checks from her [facility] RN PTA.  Her PCP Dr. Herschell Dimes usually follows her for this need at [facility].  Pt wears 2L O2 at night and obtains O2 from [place].  Pt has been to SNF in the past but does not want to return.  Pt has also completed outpatient PT/OT at [facility].      Cardiologist is Dr. Dewaine Conger with [facility].    Patient Address/Phone  [address]  [place] [address]  [phone] (home) [phone] (work)    Science writer  Extended Emergency Contact Information  Primary Emergency Contact: Dallas Breeding States  Home Phone: [phone]  Mobile Phone: [phone]  Relation: Daughter  Secondary Emergency Contact: Arsenio Loader States  Mobile Phone: [phone]  Relation:  Son    Forensic scientist: Yes, patient has a healthcare directive  Type of Healthcare Directive: Durable power of attorney for healthcare, Healthcare directive  Location of Healthcare Directive: Patient does not have it with him/her  Would patient like to fill out a (a new) Healthcare Directive?: No, patient declined  Psych Advance Directive (Psych unit only): No, patient does not have a Social research officer, government  Does the patient need discharge transport arranged?: No  Transportation Name, Phone and Availability #1: Son Nalaya Wojdyla - [phone]  Does the patient use Medicaid Transportation?: No    Expected Discharge Date  Expected Discharge Date: 02/20/17    Living Situation Prior to Admission  ? Living Arrangements  Type of Residence: Home, independent  Living Arrangements: Alone  Financial risk analyst / Tub: Tub/Shower Unit  How many levels in the residence?: 1 Can patient live on one level if needed?: Yes  Does residence have entry and/or side stairs?: No  Assistance needed prior to admit or anticipated on discharge: Yes  Who provides assistance or could if needed?: Dgt - Sherri Sear [phone]  Are they in good health?: Yes  Can support system provide 24/7 care if needed?: No  ? Level of Function   Prior level of function: Independent  ? Cognitive Abilities   Cognitive Abilities: Alert and Oriented, Participates in decision making, Recognizes impact of health condition on lifestyle, Engages in problem solving and planning, Understands nature of health condition    Financial Resources  ? Coverage  Primary Insurance: Medicare(Medicare Part A and B)  Secondary Insurance: Product manager)  Additional Coverage: Scientist, physiological; pt says all meds are affordable and she fills at [facility] in [place])    ? Source of Income   Source Of Income: SSI  ? Financial Assistance Needed?  N/A    Psychosocial Needs  ? Mental Health  Mental Health History: No  ? Substance Use History  Substance Use History Screen: No  ? Other  N/A    Current/Previous Services  ? PCP  Lona Kettle, [phone], [phone]  ? Pharmacy    [facility] [address] [place] - [address] [place] [address] [place] [address]  [place] [address]  Phone: [phone] Fax: [phone]    [facility] - [place], [place] - [address] Portal to Registered Caremark Sites  [address] [place] [address]  Phone: [phone] Fax: [phone]    [facility] - [place], [place] - [address]  [address]  [place] [address]  Phone: [phone] Fax: [phone]    ? Durable Medical Equipment   Durable Medical Equipment at home: Oxygen, Roller Walker(Pt wears 2L o2 at night and obtains from [place])  ? Home Health  Receiving home health: Yes  Agency name: Pt is currently on service with [facility] and has been since November 2018.  She denies need to resume HH upon dc.    Would patient use this agency again?: Yes  ? Hemodialysis or Peritoneal Dialysis  Undergoing hemodialysis or peritoneal dialysis: No  ? Tube/Enteral Feeds  Receive tube/enteral feeds: No  ? Infusion  Receive infusions: No  ? Private Duty  Private duty help used: No  ? Home and Community Based Services  Home and community based services: No  ? [employer]: N/A  ? Hospice  Hospice: No  ?  Outpatient Therapy  PT: No  OT: No  SLP: No  ? Skilled Nursing Facility/Nursing Home  SNF: Yes  When did patient receive care?: Novermber 2018  [facility] Facility[facility]  Would patient return for future services?: No  NH: No  ? Inpatient Rehab  IPR: No  ? Long-Term Acute Care Hospital  LTACH: No  ? Acute Hospital Stay  Acute Hospital Stay: Yes  Was patient's stay within the last 30 days?: Yes  When did patient receive care?: November 28th - 30th 2018  Name of hospital: [facility]  Readmission Code Group: 5. Medical Plan of Care - Treatment or Possible Complication  5. Medical Plan of Care - Treatment or Possible Complication: 5a. Possible infection  Related or Unrelated?: Unrelated    Gweneth Dimitri, LMSW  Surgery - Cardiothoracic/Vascular  Social Work Case Manager  *[phone]

## 2017-02-17 NOTE — Telephone Encounter
Michelle Solis at [facility] stated patient set up with humidifier on home oxygen concentrator on 02/08/17.  Richardson DoppNicole Hazely Sealey, MA    Routing to Christophe Louisourtney Kuhl, Charity fundraiserN.

## 2017-02-18 LAB — PROTIME INR (PT): Lab: 2.3 MMOL/L — ABNORMAL HIGH (ref >60)

## 2017-02-18 LAB — CBC: Lab: 6.5 K/UL — ABNORMAL HIGH (ref 4.5–11.0)

## 2017-02-18 LAB — BASIC METABOLIC PANEL: Lab: 141 MMOL/L — ABNORMAL LOW (ref 137–147)

## 2017-02-18 LAB — MAGNESIUM: Lab: 2.2 mg/dL — ABNORMAL LOW (ref >60)

## 2017-02-18 MED ORDER — POLYETHYLENE GLYCOL 3350 17 GRAM PO PWPK
1 | Freq: Every day | ORAL | 0 refills | Status: DC
Start: 2017-02-18 — End: 2017-02-23
  Administered 2017-02-18 – 2017-02-22 (×5): 17 g via ORAL

## 2017-02-18 NOTE — Progress Notes
Assumed care of pt at 0700 this AM. VSS on initial assessment and throughout remainder of shift.  ST-AFib on tele. Tolerating RA- 2L as needed with exercise.  Pain in legs bilaterally controlled with Tylenol.  Adequate UOP. BM-. Stool softeners administered.  Up with one and tolerating the exercise.  No other needs at this time. Will continue to monitor.    VancTrough tonight prior to evening dose. Will pass this on to night RN.

## 2017-02-19 LAB — MAGNESIUM: Lab: 2 mg/dL — ABNORMAL LOW (ref >60)

## 2017-02-19 LAB — CULTURE-BLOOD W/SENSITIVITY: Lab: POSITIVE K/UL — AB (ref 150–400)

## 2017-02-19 LAB — VANCOMYCIN TROUGH
Lab: 25 ug/mL — ABNORMAL HIGH (ref 10.0–20.0)
Lab: 24 ug/mL — ABNORMAL HIGH (ref 10.0–20.0)

## 2017-02-19 LAB — PROTIME INR (PT): Lab: 2.1 g/dL — ABNORMAL HIGH (ref 0.8–1.2)

## 2017-02-19 LAB — CBC: Lab: 6.3 K/UL (ref >60)

## 2017-02-19 LAB — BASIC METABOLIC PANEL: Lab: 139 MMOL/L — ABNORMAL LOW (ref 137–147)

## 2017-02-19 MED ORDER — ALBUTEROL SULFATE 2.5 MG/0.5 ML IN NEBU
2.5 mg | Freq: Two times a day (BID) | RESPIRATORY_TRACT | 0 refills | Status: DC | PRN
Start: 2017-02-19 — End: 2017-02-23
  Administered 2017-02-19 – 2017-02-23 (×7): 2.5 mg via RESPIRATORY_TRACT

## 2017-02-19 MED ORDER — VANCOMYCIN 1,250 MG IVPB
1250 mg | Freq: Two times a day (BID) | INTRAVENOUS | 0 refills | Status: DC
Start: 2017-02-19 — End: 2017-02-21
  Administered 2017-02-19 – 2017-02-20 (×5): 1250 mg via INTRAVENOUS

## 2017-02-19 MED ORDER — FUROSEMIDE 10 MG/ML IJ SOLN
40 mg | Freq: Once | INTRAVENOUS | 0 refills | Status: CP
Start: 2017-02-19 — End: ?
  Administered 2017-02-19: 22:00:00 40 mg via INTRAVENOUS

## 2017-02-19 NOTE — Consults
CARDIOLOGY CONSULT SERVICE INITIAL CONSULT NOTE       TODAY'S DATE:  02/19/2017   PATIENT NAME:  Michelle Solis  - MRN:  [id number]    ADMISSION DATE:  02/16/2017   LOS: 3 days    REASON FOR CONSULT:  Significant drop in ejection farction    ASSESSMENT:    # Enterococcal bacteremia from infection of the groin cut down site for recent TAVR  - Positive BCx     # Clinical evidence of HF with significant reduction in LV contractility  - Sepsis with associated cardiac involvement vs Stress induced CM  - LBBB    # s/p TAVR    # PAF on Multaq and Coumadin  - PFT with restrictive disease but normal DLco        RECOMMENDATIONS:  - Continue with Lasix 40mg  PO Qday  - Give 1 extra dose of 40mg  IV lasix now (ordered)  - Start Spironolactone 25mg  PO Qday  - Start Metoprolol tartrate 12.5mg  PO BID  - Discontinue Multaq given HFrEF (Ordered)  - After 48 hours of multaq discontinuation start loading with Amiodaron (400mg  PO BID x 1 Week, then 400mg  Qday x 1 Week, then 200mg  PO Qday)  - Will need follow up with HF clinic in 3 months to assess LV function on GDMT and evaluation for CRT-D placement.  - At high risk for IE given prosthetic valve and enterococcal bacteremia, patient should be followed closely by ID for optimal antibiotic treatment and source management.     Thank you for the opportunity to participate in the care of this patient.  Michelle Solis was seen and plan of care discussed with Dr. Chales Abrahams.     Please call or page with questions.  After 5PM please call Cardiology fellow on call.  Monday - Friday 8AM-5PM please call Cardiology consult pager.  --  Hamed.[email or web address] MD  Cardiovascular Disease Fellow  P; [phone]  ____________________________________________________________________     HPI: Michelle Solis is a 76 y.o. 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  Blood cultures are growing enterococci. Patient is receiving IV antibiotics and is curently stable hemodynamically.  TTE was performed showing severe reduction in EF with new WMAs not seen on prior TTE. Consult was requested for evaluation of new onset drop in EF.    Currently has SoA and DoE but denies orthopnea, PND, CP, palpitations or worsenign LE edema.       Past Medical History:   Diagnosis Date   ??? Abnormal chest CT    ??? Aortic stenosis    ??? Atrial flutter (HCC)    ??? Cerebral aneurysm rupture (HCC) 1998   ??? COPD (chronic obstructive pulmonary disease) (HCC)    ??? Dyspnea    ??? GERD (gastroesophageal reflux disease) 09/13/2011   ??? History of rheumatic fever during childhood   ??? Hypertension 09/13/2011   ??? O2 dependent    ??? Seizures (HCC)     off medications since 2000        Past Surgical History:   Procedure Laterality Date   ??? HX ENDOVASCULAR ANEURYSM REPAIR  1999    coiling   ??? APPENDECTOMY     ??? BACK SURGERY      low lumbar back  rod and screws    ??? CHOLECYSTECTOMY     ??? FEMUR SURGERY      rod inserted   ??? HX HEART CATHETERIZATION     ??? HYSTERECTOMY     ??? KNEE REPLACEMENT     ??? TONSILLECTOMY          Social History     Socioeconomic History   ??? Marital status: Divorced     Spouse name: Not on file   ??? Number of children: Not on file   ??? Years of education: Not on file   ??? Highest education level: Not on file   Social Needs   ??? Financial resource strain: Not on file   ??? Food insecurity - worry: Not on file   ??? Food insecurity - inability: Not on file   ??? Transportation needs - medical: Not on file   ??? Transportation needs - non-medical: Not on file   Occupational History   ??? Occupation: worked as Merchandiser, retail in [employer]   ??? Occupation: retired since 1998   Tobacco Use   ??? Smoking status: Former Smoker     Packs/day: 3.00     Years: 35.00     Pack years: 105.00     Types: Cigarettes Last attempt to quit: 03/07/1994     Years since quitting: 22.9   ??? Smokeless tobacco: Never Used   ??? Tobacco comment: smoked up to 3 packs a day   Substance and Sexual Activity   ??? Alcohol use: Yes     Comment: occassional   ??? Drug use: No   ??? Sexual activity: Not on file   Other Topics Concern   ??? Not on file   Social History Narrative   ??? Not on file        Family History   Problem Relation Age of Onset   ??? Cancer Mother         pancreas   ??? Suicide Father    ??? COPD Sister    ??? Diabetes Daughter         ALLERGIES: Avelox [moxifloxacin]; Ciprofloxacin; and Sulfa (sulfonamide antibiotics)     CURRENT FACILITY MEDICATIONS:   Scheduled Meds:  albuterol 0.5% (PROVENTIL; VENTOLIN) nebulizer solution 2.5 mg 2.5 mg Inhalation BID & PRN   amitriptyline (ELAVIL) tablet 50 mg 50 mg Oral QHS   aspirin EC tablet 81 mg 81 mg Oral QDAY   atorvastatin (LIPITOR) tablet 40 mg 40 mg Oral QDAY   budesonide respule (PULMICORT) nebulizer solution 0.25 mg 0.25 mg Inhalation BID   dronedarone (MULTAQ) tablet 400 mg 400 mg Oral BID w/meals   ferrous sulfate (FEOSOL, FEROSUL) tablet 325 mg 325 mg Oral BID   fluticasone (FLONASE) nasal spray 2 spray 2 spray Each Nostril QDAY   furosemide (LASIX) tablet 40 mg 40 mg Oral QDAY   nystatin (NYSTOP) topical powder  Topical BID   pantoprazole DR (PROTONIX) tablet 40 mg 40 mg Oral QDAY(21)   polyethylene glycol 3350 (MIRALAX) packet 17 g 1 packet Oral QDAY   tiotropium (SPIRIVA) capsule for inhaler 1 capsule 1 capsule Inhalation QDAY   vancomycin (VANCOCIN) 1,250 mg in dextrose 5% (D5W) IVPB 1,250 mg Intravenous Q12H*   warfarin (COUMADIN) tablet 1 mg 1 mg Oral QHS   Continuous Infusions:  PRN and Respiratory Meds:acetaminophen Q4H PRN, ondansetron (ZOFRAN) IV Q6H PRN, oxyCODONE Q6H PRN, [DISCONTINUED] vancomycin   IVPB Q12H* **AND** vancomycin, pharmacy to manage Per Pharmacy       REVIEW OF SYSTEMS:  A 10-point review of systems was negative except for those items noted in the HPI. Vital Signs:  Last Filed in 24 hours    Vital Signs:  24 hour Range    BP: 126/97 (12/16 1143)  Temp: 36.4 ???C (97.5 ???F) (12/16 1143)  Pulse: 106 (12/16 1143)  Respirations: 18 PER MINUTE (12/16 1143)  SpO2: 95 % (12/16 1143)  O2 Delivery: Nasal Cannula (12/16 1143)   BP: (115-143)/(55-97)   Temp:  [36.3 ???C (97.4 ???F)-36.9 ???C (98.5 ???F)]   Pulse:  [99-106]   Respirations:  [18 PER MINUTE]   SpO2:  [95 %-97 %]   O2 Delivery: Nasal Cannula     INTAKE/OUTPUT SUMMARY (Last 24 hours):       Intake/Output Summary (Last 24 hours) at 02/19/2017 1344  Last data filed at 02/19/2017 1136  Gross per 24 hour   Intake 300 ml   Output 2000 ml   Net -1700 ml           PHYSICAL EXAMINATION:   General:  Alert, Obese, cooperative, no distress   HEENT:  Normocephalic, atraumatic, Conjunctivae/corneas clear, EOM intact, MMM   Neck:  Supple, no carotid bruit,+ hepatojugular reflux, + JVD   Lungs:  Decreased breath sounds bilaterally   Chest wall:  No significant tenderness or deformity   Heart:  Regular rate and rhythm, distant heart sounds, S1/S2, unable to appreciate any murmur/rub/gallop   Abdomen:  Soft, non-tender, normal bowel sounds, no palpable masses   Extremities:  Extremities warm and well perfused, no cyanosis,Trace edema, no calf tenderness   Pulses:  Symmetric and 2+ in all extremities   Neurologic:  CNII - XII grossly intact, no gross sensory-motor deficit noted.     CARDIOGRAPHICS:     CHEST X-RAY:  Vascular congestion     LAST ECHOCARDIOGRAM:    ??? LVEF=20% With Abnormal Septal Motion. Dramatically New And Declined From 11/129/18. Best Function Retained At [facility] The LV With Extensive Mid To Distal Anteroapical Walls Nearly Akinetic To [place] Consider Tako-Tsubo vs CAD: See Contrast Views, Especially Apical Views #81-*89. Poor Visualization.  ??? Tachycardic: 100-131 BPM Variable  ??? Valves Are Poorly Seen, No Vegetations Detected  ??? TAVR Biopsothetic Aortic valve Appearsa Well Seated Leaflets Poorly Seen. No Apparent Regurgitation. Peak Velocity=1.50m/sec  ??? Mild Mitral Annular Calciufication And Mild MV Regurgitation. Lealftes Poorly Visualized  ??? Pulmonic and Tricuspid Valve sAre Not Well seen  ??? No Pericardial Effusion  ??? TAPSE=1.61cm  ??? Consider TEE If Endoicaritits Is Suspected On Clinical Grounds     LAST CARDIAC CATHETERIZATION:  1. 20% mid RCA disease.  Otherwise no obstructive coronary artery disease.  2. Hemostasis achieved with TR Band to the right radial artery access site.  3.    LAB/OTHER DIAGNOSTIC TESTING:   24-hour labs:    Results for orders placed or performed during the hospital encounter of 02/16/17 (from the past 24 hour(s))   VANCOMYCIN TROUGH    Collection Time: 02/18/17 10:34 PM   Result Value Ref Range    Vancomycin Trough 25.2 (H) 10.0 - 20.0 MCG/ML   CBC    Collection Time: 02/19/17  4:23 AM   Result Value Ref Range    White Blood Cells 6.3 4.5 - 11.0 K/UL    RBC 3.24 (L) 4.0 - 5.0 M/UL    Hemoglobin 9.2 (L) 12.0 - 15.0 GM/DL    Hematocrit 16.1 (L) 36 - 45 %    MCV 88.9 80 - 100 FL  MCH 28.5 26 - 34 PG    MCHC 32.1 32.0 - 36.0 G/DL    RDW 40.9 (H) 11 - 15 %    Platelet Count 251 150 - 400 K/UL    MPV 7.3 7 - 11 FL   BASIC METABOLIC PANEL    Collection Time: 02/19/17  4:23 AM   Result Value Ref Range    Sodium 139 137 - 147 MMOL/L    Potassium 4.2 3.5 - 5.1 MMOL/L    Chloride 104 98 - 110 MMOL/L    CO2 30 21 - 30 MMOL/L    Anion Gap 5 3 - 12    Glucose 120 (H) 70 - 100 MG/DL    Blood Urea Nitrogen 13 7 - 25 MG/DL    Creatinine 8.11 0.4 - 1.00 MG/DL    Calcium 9.1 8.5 - 91.4 MG/DL    eGFR Non African American >60 >60 mL/min    eGFR African American >60 >60 mL/min   MAGNESIUM    Collection Time: 02/19/17  4:23 AM   Result Value Ref Range    Magnesium 2.0 1.6 - 2.6 mg/dL   PROTIME INR (PT)    Collection Time: 02/19/17  4:23 AM   Result Value Ref Range    INR 2.1 (H) 0.8 - 1.2   VANCOMYCIN TROUGH    Collection Time: 02/19/17 10:15 AM   Result Value Ref Range Vancomycin Trough 24.2 (H) 10.0 - 20.0 MCG/ML        OTHER RADIOLOGY/DIAGNOSTICS:      Pertinent radiology reviewed.

## 2017-02-19 NOTE — Progress Notes
Pt having good urine output with PO/IV lasix. She was quite upset finding out that her EF had worsened since TAVR. HF team involved. No c/o pain, and walking around in her room with a walker. ST with PAC/afib on tele, and remains on 2L O2 NC. No other changes. Will cont to monitor.

## 2017-02-19 NOTE — Progress Notes
Assumed patient care at 1930  VS stable, AT-Afib on tele.   2L NC  Pt denies pain.   L groin C/D/I.   Adequate urine output  -BM this shift.     No other concerns at this time, will continue to monitor.

## 2017-02-19 NOTE — Progress Notes
Pharmacy Vancomycin Note  Subjective:   Michelle Solis is a 76 y.o. female being treated for Possible SSTI.    Objective:     Current Vancomycin Orders   Medication Dose Route Frequency    vancomycin (VANCOCIN) 1,250 mg in dextrose 5% (D5W) IVPB  1,250 mg Intravenous Q12H*    vancomycin, pharmacy to manage  1 each Service Per Pharmacy     Start Date of  Vancomycin therapy: 02/17/2017    White Blood Cells   Date/Time Value Ref Range Status   02/19/2017 0423 6.3 4.5 - 11.0 K/UL Final   02/18/2017 0444 6.5 4.5 - 11.0 K/UL Final   02/17/2017 0434 7.7 4.5 - 11.0 K/UL Final   02/16/2017 2153 8.9 4.5 - 11.0 K/UL Final     Creatinine   Date/Time Value Ref Range Status   02/19/2017 0423 0.89 0.4 - 1.00 MG/DL Final   16/10/960412/15/2018 [id number] 0.79 0.4 - 1.00 MG/DL Final   81/19/147812/14/2018 [id number] 0.92 0.4 - 1.00 MG/DL Final     Blood Urea Nitrogen   Date/Time Value Ref Range Status   02/19/2017 0423 13 7 - 25 MG/DL Final     Actual Weight:  125.9 kg (277 lb 9.6 oz)  Dosing BW:  125.9 kg   Drug Levels:  Vancomycin Trough   Date/Time Value Ref Range Status   02/19/2017 1015 24.2 (H) 10.0 - 20.0 MCG/ML Final       Assessment:   Target levels for this patient: 10-15.  Evaluation of level(s): Level drawn appropriately just before 5th dose. Reflective of true trough. Current level supratherapeutic.    Plan:   1. Level confirmed to be supratherapeutic. Decrease dose to Vancomycin 1250mg  IV q12h, scheduled to start at 1600 to allow for extra time to clear previous dose. Check level prior to 4th dose.  2. Next scheduled level(s): Prior to 4th dose.of new regimen.  3. Pharmacy will continue to monitor and adjust therapy as needed.    Candee FurbishMason Brazen Domangue, [facility] Campus[facility]ARMD  02/19/2017

## 2017-02-19 NOTE — Progress Notes
RT Adult Assessment Note    NAME:Michelle Solis             MRN: [id number]             DOB:March 15, 1940          AGE: 76 y.o.  ADMISSION DATE: 02/16/2017             DAYS ADMITTED: LOS: 3 days    RT Treatment Plan:  Protocol Plan: Medications  Albuterol: Neb BID(Home reg)  Albuterol/Ipratropium: Discontinued  Ipratropium: MDI Q day(Home reg)    Protocol Plan: Procedures  Oxygen/Humidity: O2 to keep SpO2 > 92%  Monitoring: Pulse oximetry BID & PRN  Comment: pulmicort BID / 2L of O2 at noc    Additional Comments:  Impressions of the patient: No respiratory distress.  Intervention(s)/outcome(s): Cont on home regs Alb BID, Pulmicort BID, Spiriva QDay. On 2L of O2 at noc.   Patient education that was completed: none  Recommendations to the care team: none    Vital Signs:  Pulse: Pulse: 101  RR: Respirations: 18 PER MINUTE  SpO2: SpO2: 96 %  O2 Device: $$ O2 Device: Cannula  Liter Flow: O2 Liter Flow: 2.5 lpm  O2%:    Breath Sounds:  clear   Respiratory Effort: Respiratory Effort: Non-Labored

## 2017-02-19 NOTE — Progress Notes
Pharmacy Vancomycin Note  Subjective:   Duncan DullLinda G Schofield is a 76 y.o. female being treated for Possible SSTI.    Objective:     Current Vancomycin Orders   Medication Dose Route Frequency    vancomycin (VANCOCIN) 1,750 mg in dextrose 5% (D5W) IVPB  1,750 mg Intravenous Q12H*    vancomycin, pharmacy to manage  1 each Service Per Pharmacy     Start Date of  Vancomycin therapy: 02/17/2017    White Blood Cells   Date/Time Value Ref Range Status   02/19/2017 0423 6.3 4.5 - 11.0 K/UL Final   02/18/2017 0444 6.5 4.5 - 11.0 K/UL Final   02/17/2017 0434 7.7 4.5 - 11.0 K/UL Final   02/16/2017 2153 8.9 4.5 - 11.0 K/UL Final     Creatinine   Date/Time Value Ref Range Status   02/19/2017 0423 0.89 0.4 - 1.00 MG/DL Final   13/24/401012/15/2018 [id number] 0.79 0.4 - 1.00 MG/DL Final   36/64/403412/14/2018 [id number] 0.92 0.4 - 1.00 MG/DL Final     Blood Urea Nitrogen   Date/Time Value Ref Range Status   02/19/2017 0423 13 7 - 25 MG/DL Final     Actual Weight:  125.9 kg (277 lb 9.6 oz)  Dosing BW:  125.9 kg   Drug Levels:  Vancomycin Trough   Date/Time Value Ref Range Status   02/18/2017 2234 25.2 (H) 10.0 - 20.0 MCG/ML Final       Assessment:   Target levels for this patient: 10-15.  Evaluation of level(s): Timestamp of vancomycin trough (2234) suggests it may have been drawn while vancomycin infusion running (started 2228). Unable to make definitive assessment of level with present information. Redraw level prior to 5th dose.    Plan:   1. With level possibly drawn while vancomycin infusion running, will get new level prior to 5th dose. Will reassess with appropriate level.  2. Next scheduled level(s): Prior to 5th dose.  3. Pharmacy will continue to monitor and adjust therapy as needed.    Candee FurbishMason Kyeshia Zinn, [facility]MD  02/19/2017

## 2017-02-20 DIAGNOSIS — T8141XA Infection following a procedure, superficial incisional surgical site, initial encounter: Principal | ICD-10-CM

## 2017-02-20 LAB — BASIC METABOLIC PANEL
Lab: 0.9 mg/dL — ABNORMAL LOW (ref 0.4–1.00)
Lab: 10 pg (ref 3–12)
Lab: 100 MMOL/L — ABNORMAL LOW (ref >60)
Lab: 13 mg/dL — ABNORMAL HIGH (ref 7–25)
Lab: 140 MMOL/L — ABNORMAL LOW (ref 137–147)
Lab: 220 mg/dL — ABNORMAL HIGH (ref 70–100)
Lab: 3.7 MMOL/L — ABNORMAL LOW (ref 3.5–5.1)
Lab: 30 MMOL/L (ref >60)
Lab: 60 mL/min — ABNORMAL LOW (ref >60)
Lab: 9.4 mg/dL (ref 8.5–10.6)
Lab: >60 mL/min (ref >60)

## 2017-02-20 LAB — PROTIME INR (PT): Lab: 1.8 M/UL — ABNORMAL HIGH (ref 0.8–1.2)

## 2017-02-20 LAB — CBC: Lab: 8.3 K/UL (ref 4.5–11.0)

## 2017-02-20 MED ORDER — AMPICILLIN 2G/100ML NS IVPB (MB+)
2 g | INTRAVENOUS | 0 refills | Status: DC
Start: 2017-02-20 — End: 2017-02-23
  Administered 2017-02-21 – 2017-02-23 (×30): 2 g via INTRAVENOUS

## 2017-02-20 MED ORDER — SPIRONOLACTONE 25 MG PO TAB
25 mg | Freq: Every day | ORAL | 0 refills | Status: DC
Start: 2017-02-20 — End: 2017-02-23
  Administered 2017-02-20 – 2017-02-23 (×4): 25 mg via ORAL

## 2017-02-20 MED ORDER — FUROSEMIDE 10 MG/ML IJ SOLN
40 mg | Freq: Every day | INTRAVENOUS | 0 refills | Status: DC
Start: 2017-02-20 — End: 2017-02-22
  Administered 2017-02-20 – 2017-02-22 (×3): 40 mg via INTRAVENOUS

## 2017-02-20 MED ORDER — NITROFURANTOIN MONOHYD/M-CRYST 100 MG PO CAP
100 mg | Freq: Two times a day (BID) | ORAL | 0 refills | Status: DC
Start: 2017-02-20 — End: 2017-02-23
  Administered 2017-02-20 – 2017-02-23 (×7): 100 mg via ORAL

## 2017-02-20 MED ORDER — AMIODARONE 200 MG PO TAB
400 mg | Freq: Two times a day (BID) | ORAL | 0 refills | Status: DC
Start: 2017-02-20 — End: 2017-02-23
  Administered 2017-02-20 – 2017-02-23 (×7): 400 mg via ORAL

## 2017-02-20 NOTE — Progress Notes
02/20/17 1107   Cardiac Rehab Activity   Distance Walked (feet) 250 ft   BP Pre-activity 141/65   BP Post-activity 109/58   HR Pre-activity 109 bpm   HR Post-activity 114   SaO2 Pre-activity 92 %   SaO2 Post-activity 94   O2 Device Nasal Cannula   O2 (lpm) 2 LPM   Comments Pt ambulated into restroom and then into the hallway. Pt stopped for one seated rest break. Pt then ambulated back to room. Pt reporting some SOA. Rn notified of SOA   Mobility   Progressive Mobility Level 8   Level of Assistance Assist X1   Assistive Device Walker   Time Tolerated 0-10 minutes   Activity Limited By Patient request to stop;Shortness of air

## 2017-02-20 NOTE — Progress Notes
Heart Failure Progress Note    NAME:Michelle Solis                                                                   MRN: [id number]                 DOB:Dec 20, 1940          AGE: 76 y.o.  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    Chronic respiratory failure with hypoxia, on home oxygen therapy (HCC)    Bronchiectasis (HCC)    Chronic anticoagulation    S/P TAVR (transcatheter aortic valve replacement)    Chronic HFrEF (heart failure with reduced ejection fraction) (HCC)    Wound infection after surgery    Candidal dermatitis     RECOMMENDATIONS:  - Change lasix to IV  - Start Spironolactone 25mg  PO Qday  - Anticipate starting Metoprolol tartrate 12.5mg  PO BID tomorrow  - anticipate starting losartan in the near future to optimize GDMT  - After 48 hours of multaq discontinuation start loading with Amiodarone (400mg  PO BID x 1 Week, then 400mg  Qday x 1 Week, then 200mg  PO Qday)-start amiodarone 12/17 am  - Will need follow up with HF clinic in 3 months to assess LV function on GDMT and evaluation for CRT-D placement.  - At high risk for IE given prosthetic valve and enterococcal bacteremia, patient should be followed closely by ID for optimal antibiotic treatment and source management.    Patient was seen and discussed with Dr. Kathreen Cosier on HF rounds.    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   She presents with signs of hypervolemia with bi  ventricular failure with signs of low flow state.  Admission BNP: 100  Goal Output: 1.5-2 L/24hr  Intake/Output:  Entire stay net: - , Last 24 hr net: -  Goal Dry Weight: unknown   Admission Weight: 125.9 kg (277 lb 9.6 oz)        Most recent weights (inpatient):   Vitals:    02/17/17 1334 02/18/17 0200 02/20/17 0657   Weight: 125.9 kg (277 lb 9 oz) 125.9 kg (277 lb 9.6 oz) 124.2 kg (273 lb 12.8 oz)        GDMT PTA Changes   BB no    ACEI/ARB/ARNI no    Aldosterone Antagonist no    Hydralazine/Nitrate no  Ivabradine no    HRMT  No (No prior history of HF)     Anticoagulation for Afib/flutter Yes-warfarin    Antiarrhythmics Yes-multaq 400mg  po BID 12/16: last dose at 0830       Diuretic Therapy    Prior to admission dose    Lasix 40mg  po daily   Given on admission         Daily Dosing   12/16: lasix 40mg  po x 1, lasix 40mg  IV x 1  12/17: lasix 40mg  po x 1       Enterococcal bacteremia   - infection stemming from the groin cut down site from TAVR  > ID consulted today  > currently on vancomycin    Atrial fibrillation  - PTA multaq and warfarin  - Multaq dc'd 0830 on 12/16 due to newly discovered depressed EF  > anticipate starting amiodarone after 48 hour washout (12/18@ 0900)    S/p TAVR 02/01/17          _____________________________________________________________________________    Subjective:     Today she reports dyspnea on exertion, abdominal fullness and muscle cramping. She ambulated with cardiac rehab today.   Symptoms of heart failure show no change.    She denies lightheadedness, near syncope, chest pain, abdominal fullness and peripheral edema.    Overnight Events:none      Objective: albuterol 0.5% (PROVENTIL; VENTOLIN) nebulizer solution 2.5 mg 2.5 mg Inhalation BID & PRN   amitriptyline (ELAVIL) tablet 50 mg 50 mg Oral QHS   aspirin EC tablet 81 mg 81 mg Oral QDAY   atorvastatin (LIPITOR) tablet 40 mg 40 mg Oral QDAY   budesonide respule (PULMICORT) nebulizer solution 0.25 mg 0.25 mg Inhalation BID   ferrous sulfate (FEOSOL, FEROSUL) tablet 325 mg 325 mg Oral BID   fluticasone (FLONASE) nasal spray 2 spray 2 spray Each Nostril QDAY   furosemide (LASIX) tablet 40 mg 40 mg Oral QDAY   nystatin (NYSTOP) topical powder  Topical BID   pantoprazole DR (PROTONIX) tablet 40 mg 40 mg Oral QDAY(21)   polyethylene glycol 3350 (MIRALAX) packet 17 g 1 packet Oral QDAY   tiotropium (SPIRIVA) capsule for inhaler 1 capsule 1 capsule Inhalation QDAY   vancomycin (VANCOCIN) 1,250 mg in dextrose 5% (D5W) IVPB 1,250 mg Intravenous Q12H*   warfarin (COUMADIN) tablet 1 mg 1 mg Oral QHS     acetaminophen Q4H PRN, ondansetron (ZOFRAN) IV Q6H PRN, oxyCODONE Q6H PRN, [DISCONTINUED] vancomycin   IVPB Q12H* **AND** vancomycin, pharmacy to manage Per Pharmacy                       Vital Signs:  Last Filed                Vital Signs: 24 Hour Range   BP: 127/70 (12/17 0340)  Temp: 36.5 ???C (97.7 ???F) (12/17 0340)  Pulse: 105 (12/17 0726)  Respirations: 16 PER MINUTE (12/17 0612)  SpO2: 92 % (12/17 0612)  O2 Delivery: Nasal Cannula (12/17 0340)  BP: (114-137)/(49-97)   Temp:  [36.4 ???C (97.5 ???F)-37.1 ???C (98.7 ???F)]   Pulse:  [98-113]   Respirations:  [16 PER MINUTE-18 PER MINUTE]   SpO2:  [92 %-96 %]   O2 Delivery: Nasal Cannula    Intensity Pain Scale (Self Report): 4       PHYSICAL EXAMINATION:   General:  Alert, Obese, cooperative, no distress   HEENT:  Normocephalic, atraumatic, Conjunctivae/corneas clear, EOM intact, MMM   Neck:  Supple,  no carotid bruit,JVD ~7cm sitting upright, mild HJR   Lungs:  Decreased breath sounds bilaterally   Chest wall:  No significant tenderness or deformity Heart:  Regular rate and rhythm, distant heart sounds, S1/S2, unable to appreciate any murmur/rub/gallop   Abdomen:  Soft, non-tender, normal bowel sounds, no palpable masses   Extremities:  Extremities warm and well perfused, no cyanosis,Trace edema at sock line, no calf tenderness   Pulses:  Symmetric and 2+ in all extremities   Neurologic:  CNII - XII grossly intact, no gross sensory-motor deficit noted.      Laboratory Review:   24-hour labs:    Results for orders placed or performed during the hospital encounter of 02/16/17 (from the past 24 hour(s))   PROTIME INR (PT)    Collection Time: 02/20/17  4:07 AM   Result Value Ref Range    INR 1.8 (H) 0.8 - 1.2   CBC    Collection Time: 02/20/17  9:47 AM   Result Value Ref Range    White Blood Cells 8.3 4.5 - 11.0 K/UL    RBC 3.66 (L) 4.0 - 5.0 M/UL    Hemoglobin 10.3 (L) 12.0 - 15.0 GM/DL    Hematocrit 72.5 (L) 36 - 45 %    MCV 89.3 80 - 100 FL    MCH 28.1 26 - 34 PG    MCHC 31.5 (L) 32.0 - 36.0 G/DL    RDW 36.6 (H) 11 - 15 %    Platelet Count 287 150 - 400 K/UL    MPV 7.6 7 - 11 FL   BASIC METABOLIC PANEL    Collection Time: 02/20/17  9:47 AM   Result Value Ref Range    Sodium 140 137 - 147 MMOL/L    Potassium 3.7 3.5 - 5.1 MMOL/L    Chloride 100 98 - 110 MMOL/L    CO2 30 21 - 30 MMOL/L    Anion Gap 10 3 - 12    Glucose 220 (H) 70 - 100 MG/DL    Blood Urea Nitrogen 13 7 - 25 MG/DL    Creatinine 4.40 0.4 - 1.00 MG/DL    Calcium 9.4 8.5 - 34.7 MG/DL    eGFR Non African American 60 (L) >60 mL/min    eGFR African American >60 >60 mL/min       Point of Care Testing:  (Last 24 hours):      Tele/ECG: Afib/ST rates ~100    Echocardiogram Details:  Echo Results  (Last 3 results in the past 3 years)    Echo EF LVIDD LA Size IVS LVPW Rest PAP    (02/17/17)  20 (02/17/17)  4.58 (02/17/17)  4.33 (02/17/17)  0.89 (02/17/17)  0.86 (02/17/17)  N/A    (02/02/17)  65 (02/02/17)  5.14 (02/02/17)  5.11 (02/02/17)  1.29 (02/02/17)  1.01 (02/02/17)  N/A    (01/05/17)  45 (08/05/16) 4.50 (08/05/16)  4.40 (08/05/16)  1.20 (08/05/16)  1.00 (08/05/16)  N/A           Chest X-Ray 12/17:  pending      Other Radiology/Diagnostics Review:     >Arterial doppler LLE 12/13:  Complex right groin subcutaneous fluid collection most consistent with a   hematoma. A thrombosed femoral artery pseudoaneurysm could produce a   similar appearance. No significant associated hyperemia to suggest   abscess.

## 2017-02-20 NOTE — Progress Notes
CARDIOTHORACIC SURGERY DAILY PROGRESS NOTE    PROCEDURE: TAVR right groin cutdown 11/28 Dr Darlyne Russian.     SUBJECTIVE:   Overnight events: Went home with Provena superficial wound vac due to body habitus and home health. She states that she had a fever recently as high as 102. Her Provena was removed 12/7.       ASSESSMENT:  Active Problems:    Chronic A-fib (HCC)    COPD (chronic obstructive pulmonary disease) (HCC)    Obesity, Class III, BMI 40-49.9 (morbid obesity) (HCC)    Coronary artery disease involving native coronary artery of native heart without angina pectoris    Chronic respiratory failure with hypoxia, on home oxygen therapy (HCC)    Bronchiectasis (HCC)    Chronic anticoagulation    S/P TAVR (transcatheter aortic valve replacement)    Chronic HFrEF (heart failure with reduced ejection fraction) (HCC)    Wound infection after surgery    Candidal dermatitis      PLAN:  1. CV - ST/atrial fib with ectopy/occasional small pause, HR low 100's. BP 120-135s. Cont ASA. Not on BB. Chronic A fib. PTA Multaq and coumadin. INR on admission was 2.7-->2.3-->2.1-->1.8. Will ask heart failure to see patient due to decrease in heart function. ECHO: LVEF=20% With Abnormal Septal Motion. Dramatically New And Declined From 01/16/17  2. Resp - CXR ordered. Cont IS, wean O2, aggressive pulm toilet. 2L NC  3. Renal - creatinine stable 0.79-->P. UOP -3.8L/24hrs. Lasix 40mg  QD -1kg since admit  4. GI - +BM 12/16  5. ID/Heme - WBC 7.7-->6.5-->6.3. Afebrile. Hgb 9.0. Positive blood cultures- gram + strep. Enterococcus.   6. Endo - FSBS stable on current regimen.   7. Activity - Cardiac rehab TID. Up ad lib. Did not get out of bed yesterday  8. Disposition - Will change to [facility] from [facility] when recommended by cardiology. Am labs pending. Consult ID. Will likely need long term ABX due to new TAVR         OBJECTIVE:  Vitals:    02/19/17 2018 02/19/17 2316 02/20/17 0340 02/20/17 0612   BP: 120/49 137/77 127/70 Pulse: 98 106 104 113   Temp: 36.7 ???C (98 ???F) 37.1 ???C (98.7 ???F) 36.5 ???C (97.7 ???F)    SpO2: 95% 95% 96% 92%   Weight:       Height:           Physical Exam:  General: A&O x 3  Cardiovascular: RRR no rub or murmur  Respiratory: LS CTA bil  GI: soft, NT, +BS  Extremities: No Edema  Incisions: left groin incision is healed. No drainage. Minimal erythema. No fluctuance noted         LABS:  Lab Results   Component Value Date/Time    WBC 6.3 02/19/2017 04:23 AM    HGB 9.2 (L) 02/19/2017 04:23 AM    HCT 28.8 (L) 02/19/2017 04:23 AM    PLTCT 251 02/19/2017 04:23 AM          Lab Results   Component Value Date/Time    NA 139 02/19/2017 04:23 AM    K 4.2 02/19/2017 04:23 AM    CL 104 02/19/2017 04:23 AM    CO2 30 02/19/2017 04:23 AM    BUN 13 02/19/2017 04:23 AM    CR 0.89 02/19/2017 04:23 AM    GLU 120 (H) 02/19/2017 04:23 AM    Lab Results   Component Value Date    MG 2.0 02/19/2017     Lab Results  Component Value Date    PO4 4.3 (H) 06/14/2013           Lab Results   Component Value Date    GLUPOC 128 (H) 02/03/2017    GLUPOC 117 (H) 02/03/2017    GLUPOC 120 (H) 02/02/2017    GLUPOC 124 (H) 02/02/2017    GLUPOC 99 02/02/2017    GLUPOC 128 (H) 02/02/2017    GLUPOC 203 (H) 02/01/2017    GLUPOC 170 (H) 02/01/2017       Carl Butner, PA-C  [phone]

## 2017-02-20 NOTE — Discharge Instructions - Pharmacy
Physician Discharge Summary      Name: Michelle Solis  Medical Record Number: [id number]        Account Number:  [id number]  Date Of Birth:  [birth year]                         Age:  76 years   Admit date:  02/16/2017                     Discharge date:  02/23/2017    Attending Physician:  Dr Philemon Kingdom               Service: Cardiothor Surg    Physician Summary completed by: Nathanial Millman, PA-C    Reason for hospitalization: Sepsis     Significant PMH:   Past Medical History:   Diagnosis Date   ??? Abnormal chest CT    ??? Aortic stenosis    ??? Atrial flutter (HCC)    ??? Cerebral aneurysm rupture (HCC) 1998   ??? COPD (chronic obstructive pulmonary disease) (HCC)    ??? Dyspnea    ??? GERD (gastroesophageal reflux disease) 09/13/2011   ??? History of rheumatic fever during childhood   ??? Hypertension 09/13/2011   ??? O2 dependent    ??? Seizures (HCC)     off medications since 2000          Allergies: Avelox [moxifloxacin]; Ciprofloxacin; and Sulfa (sulfonamide antibiotics)    Admission Physical Exam notable for: See admit H&P      Admission Lab/Radiology studies notable for: Positive blood cultures (Gram + Enterococcus). Positive urine culture (E. Coli).     Brief Hospital Course:  The patient was admitted on 12/14 after complaints of fevers and general fatigue and malaise. She had a TAVR performed by Dr Ky Barban on 02/10/2017. Due to her large body habitus she was sent home with [facility] and a Provena wound vac on the left groin to facilitate healing and prophylactically for groin infection. After Provena was removed [facility] nurse felt that groin was erythematous and malodorous. Patient arrived afebrile and without elevated WBC count. Blood and urine cultures were obtained and positive for e Coli and Enterococcus. Left groin incision was healed at time of admission. No large fluid collection noted. She was started on Vancomycin and ID was consulted. During admission she also had TTE which showed a marked reduction in her EF. Immediately post op from TAVR her LVEF was estimated at 45%. ECHO this admission demonstrated EF of 60%. No vegetations noted on valve. Cardiology was consulted for assistance in medical management. Patient increased her ambulation daily. Her bladder and bowel function were at baseline on discharge. She was stable and ready for discharge HD7.  She will be going home with Ampicillin infusion per ID recs.   She will also continue with her Coumadin for A fib. Dose changed to 1mg  QHS. First INR check Friday 12/21. Multaq was transitioned to Amiodarone per cardiology.     Patient to be discharged home today.    Indication for coumadin: A. Fib    INR therapeutic range: 2.0-3.0    Received 1 mg of coumadin nightly.    INR at discharge: 2.3    Discharged home with 1 mg tablets.    Instructed to take: 1 mg tonight and 1 mg until repeat INR    Instructed to have INR rechecked on: Friday 12/21 by Dr Herschell Dimes in [place]       Condition at  Discharge: Stable    Discharge Diagnoses:       Hospital Problems        Active Problems    Chronic A-fib (HCC)    COPD (chronic obstructive pulmonary disease) (HCC)    Obesity, Class III, BMI 40-49.9 (morbid obesity) (HCC)    Coronary artery disease involving native coronary artery of native heart without angina pectoris    Chronic respiratory failure with hypoxia, on home oxygen therapy (HCC)    Bronchiectasis (HCC)    Chronic anticoagulation    S/P TAVR (transcatheter aortic valve replacement)    Wound infection after surgery    Candidal dermatitis    Chronic diastolic heart failure, NYHA class 2 (HCC)          Surgical Procedures: None    Significant Diagnostic Studies and Procedures:   ??? Left atrium is mildly dilated  ??? Right atrium is not dilated  ??? Right ventricular chamber size is normal and function is normal  ??? Left ventricular chamber size is normal; without left ventricular hypertrophy ??? Normal left ventricular systolic function, with an ejection fraction estimated at 60%  ??? Evolut Pro #26 in the aortic position, with trace to mild perivalvular leak  ??? No vegetation/mass identified involving a cardiac valve    BNP  On admission 100  Chronic Diastolic HF       Consults:  Cardiology and ID    Patient Disposition: Home with Home Health Care       Patient instructions/medications:      Activity as Tolerated    It is important to keep increasing your activity level after you leave the hospital.  Moving around can help prevent blood clots, lung infection (pneumonia) and other problems.  Gradually increasing the number of times you are up moving around will help you return to your normal activity level more quickly.  Continue to increase the number of times you are up to the chair and walking daily to return to your normal activity level.  You should resume your normal activity in 6 week(s).     Bathing Restrictions    NO tub baths, hot tubs, or swimming for 2 weeks. Shower and clean bilateral groin areas daily. Place interdry cloth on left groin daily     Other Activity Restrictions    You MUST take antibiotics prior to any dental procedures (including cleaning), invasive respiratory tract procedures and invasive skin procedures.  You should NOT have any of these procedure for 3 months after your valve surgery.  This will help to prevent infection to the heart valve.  Contact your primary care provider or cardiologist for an antibiotic prescription when needed.     Report These Signs and Symptoms    Please contact your doctor for the following symptoms:  *Temperature over 100 degrees F  *Uncontrolled pain  *Drainage with a foul odor  *Shortness of breath  *Racing or skipping heart beats  *Swelling or tenderness in your groin  *Painful/Cold/Discolored legs or toes  *Suture material sticking up through your incisions  *Change in coordination of ability to talk *If you gain more than 2 pounds in 24 hours, or 5 pounds in one week.     Questions About Your Stay    For questions or concerns regarding your hospital stay. Call [phone]     Discharging attending physician: Zella Richer [[id number]]      Cardiac Diet    Limiting unhealthy fats and cholesterol is the most important step you can  take in reducing your risk for cardiovascular disease. Unhealthy fats include saturated and trans fats. Monitor your sodium and cholesterol intake. Restrict your sodium to 2g (grams) or 2000mg  (milligrams) daily, and your cholesterol to 200mg  daily.    If you have questions regarding your diet at home, you may contact a dietitian at [phone].     Return Appointment     [place] Provider Hedwig Morton [[id number]]    Location [facility]    Appointment date: 03/13/2017    Appointment time: 9:00 AM      Return Appointment    Cardiovascular clinic in [facility]     [place] Provider Baldo Ash [[id number]]    Appointment date: 03/06/2017    Appointment time: 2:00 PM      Cardiac Rehab    Your physician has referred you to outpatient cardiac rehab. Contact [facility] Cardiac Rehab Department at [phone] to schedule an appointment.     Warfarin Information    WARFARIN INFORMATION    Notify Dr. Herschell Dimes at discharge that you have an INR due on Friday 12/21. If you are not notified on the day of your INR draw with the result and your dosage instructions, please call your doctor's office to get this information.    Medication regimen:  You will be discharged on warfarin.  Warfarin is a blood thinner medication.  The dose you are currently taking may change based on blood levels/INR upon follow-up.  It is very important to continue taking the medication as prescribed.  Do not change your dose unless instructed by a healthcare professional.  Warfarin requires monitoring of blood levels/INR on a regular basis. You should tell your healthcare professionals that you take warfarin.    Follow-up:   Follow-up on all scheduled appointments.  Warfarin dosing may change based on your blood level/INR.  If not done already, you will want to schedule an appointment after discharge to follow-up on your blood levels/INR.  You may need to follow-up multiple times during the first several weeks after hospital discharge.      Drug Interactions:  Talk with your healthcare professional prior to starting or stopping any medications.  This includes prescription, over-the-counter, natural supplements, vitamins, and minerals.  Many medications may increase or decrease your blood level/INR.      Dietary Advice:  Certain foods with vitamin K may alter the effects of warfarin.  Green, leafy vegetables (examples: broccoli, spinach, kale) are some vegetables that may change blood levels/INR.  It is important to keep a consistent diet.  Avoid any major changes in diet when taking warfarin.  Please notify a health professional before changing your eating habits.    Adverse Reactions:  The most common reaction seen with warfarin is an increased risk of bleeding.  Bruising may also be a common occurrence while taking this medication.      Reasons to go to the emergency department:  *Falling/hitting your head, with periods of headaches, vision changes, dizziness, loss of   consciousness  *Heavy pressure on your chest, difficulty breathing, shortness of breath.  This may be a sign of a clot in your lungs.  *Blood-tinged vomiting, or what looks like coffee-grounds.  This may be a sign of a stomach bleed.  *Bright red urine.  This may be a sign of a bleed in your bladder.  *Extreme temperature changes, swelling, and pain in your thighs.  This may be a sign of a clot in your legs.  Current Discharge Medication List       START taking these medications    Details   amiodarone (CORDARONE) 200 mg tablet Take one tablet by mouth daily. Take with food.  Qty: 90 tablet, Refills: 3    PRESCRIPTION TYPE:  Normal ampicillin (OMNIPEN) 2 g/10 mL 2 g in sodium chloride 0.9% (NS) 0.9 % 100 mL IVPB (MB+) Administer two g through vein every 4 hours.  Qty: 2 g, Refills: 0    PRESCRIPTION TYPE:  No Print      cefTRIAXone (ROCEPHIN) 2 g solr Administer 20 mL through vein every 12 hours.  Qty: 30 each, Refills: 0    PRESCRIPTION TYPE:  No Print      spironolactone (ALDACTONE) 25 mg tablet Take one tablet by mouth daily. Take with food.  Qty: 90 tablet, Refills: 3    PRESCRIPTION TYPE:  Normal          CONTINUE these medications which have been CHANGED or REFILLED    Details   warfarin (COUMADIN) 1 mg tablet Take one tablet by mouth daily.  Qty: 60 tablet, Refills: 1    PRESCRIPTION TYPE:  Normal          CONTINUE these medications which have NOT CHANGED    Details   acetaminophen (TYLENOL) 325 mg tablet Take 2 Tabs by mouth every 4 hours as needed.  Qty: 30 Tab, Refills: 1    PRESCRIPTION TYPE:  Print      albuterol-ipratropium (DUO-NEB, DUO-VENT) 0.5 mg-3 mg(2.5 mg base)/3 mL nebulizer solution Inhale 3 mL solution by nebulizer as directed twice daily.    PRESCRIPTION TYPE:  Historical Med      amitriptyline (ELAVIL) 50 mg tablet Take 50 mg by mouth at bedtime daily.    PRESCRIPTION TYPE:  Historical Med      aspirin EC 81 mg tablet Take 1 Tab by mouth daily.  Qty: 90 Tab, Refills: 1    PRESCRIPTION TYPE:  Print      atorvastatin (LIPITOR) 40 mg tablet Take one tablet by mouth daily.  Qty: 90 tablet, Refills: 3    PRESCRIPTION TYPE:  Normal      budesonide respule (PULMICORT) 0.25 mg/2 mL nebulizer solution Inhale 0.25 mg solution by nebulizer as directed twice daily.    PRESCRIPTION TYPE:  Historical Med      Calcium-Cholecalciferol (D3) (CALCIUM 600 + D) 600 mg(1,500mg ) -400 unit tab Take 1 tablet by mouth daily.    PRESCRIPTION TYPE:  Historical Med      ferrous sulfate 325 mg (65 mg iron) tablet Take 325 mg by mouth twice daily.    PRESCRIPTION TYPE:  Historical Med furosemide (LASIX) 40 mg tablet TAKE ONE TABLET BY MOUTH ONCE DAILY IN THE MORNING  Qty: 90 tablet, Refills: 3    PRESCRIPTION TYPE:  Normal      omeprazole DR(+) (PRILOSEC) 40 mg capsule Take 40 mg by mouth daily.    PRESCRIPTION TYPE:  Historical Med      sodium chloride/aloe vera (AYR SALINE) topical gel Apply  into nose as directed as Needed.    PRESCRIPTION TYPE:  Historical Med      tiotropium bromide (SPRIVA RESPIMAT) 1.25 mcg/actuation inhaler Inhale 2 puffs by mouth into the lungs daily.    PRESCRIPTION TYPE:  Historical Med      vitamins, multiple cap Take 1 capsule by mouth daily.    PRESCRIPTION TYPE:  Historical Med          The following medications  were removed from your list. This list includes medications discontinued this stay and those removed from your prior med list in our system        dronedarone (MULTAQ) 400 mg tablet               Scheduled appointments:    Mar 16, 2017  4:00 PM CST  Specialty Return with Loreta Ave, MD  [facility] - Internal Medicine [facility] Internal Medicine) Ortho and Medical Pavilion Level 4C  [address]  [place] [address]  [phone]   Mar 20, 2017  2:30 PM CST  Echo Doppler with ST [facility]  Cardiovascular Medicine ([facility]) [address] [place] [address]  [phone]   Mar 23, 2017  2:00 PM CST  RETURN PT LONG with Baldo Ash, MD  Cardiovascular Medicine at [facility]) [address]  [address]  [place] [address]  [phone]   Apr 10, 2017  1:00 PM CST  CT CHEST W/O CONTRAST with CT-MOB  [facility] Radiology [facility] Radiology) [address] MED OFFICE [place] [address]  [phone]          Pending items needing follow up: INR check tomorrow, Friday 12/21    Signed:  Nathanial Millman, PA-C  03/08/2017      cc:  Primary Care Physician:  Lona Kettle   Verified  Referring physicians:  Lona Kettle, MD   Additional provider(s):

## 2017-02-20 NOTE — Progress Notes
02/20/17 1400   Cardiac Rehab Activity   Distance Walked (feet) 250 ft   BP Pre-activity (!) 142/38   BP Post-activity (!) 140/95   HR Pre-activity 90 bpm   HR Post-activity 111   SaO2 Post-activity 92   O2 Device Nasal Cannula   O2 (lpm) 2 LPM   Comments Pt agreeable to ambulate, but pt requesting to stay close to room due to frequent urination. Pt ambulated with a wheeled walker. Pt requesting to return to room due to back pain and sob.RN made aware as well as RN aware of BPs   Mobility   Progressive Mobility Level 8   Level of Assistance Assist X1   Assistive Device Walker   Time Tolerated 0-10 minutes   Activity Limited By Pain;Shortness of air

## 2017-02-20 NOTE — Case Management (ED)
Case Management Progress Note    NAME:Michelle Solis                          MRN: [id number]              DOB:[birth year]          AGE: 76 y.o.  ADMISSION DATE: 02/16/2017             DAYS ADMITTED: LOS: 4 days      Todays Date: 02/20/2017    Plan  Continue inpt care- Will change to Amio from Multaq when recommended by cardiology. Am labs pending. Consult ID. Will likely need long term ABX due to new TAVR     Interventions  ? Support   Support: Pt/Family Updates re:POC or DC Plan, Patient Education  ? Info or Referral      ? Discharge Planning   Discharge Planning: Home Health, Home Infusion-Enteral-TPN\   EMR and POC reviewed   NCM met w pt at the bedside to further discuss home infusion/HH needs.    Pt prefers to dc to home vs inpt.    NCM confirmed w pt that she would like to use [facility] and does not have a preference on which [place] agency used    NCM sent initial referral to [facility] and [facility]- awaiting benefits check.    Pt states that she does have a teachable caregiver being her dgt however they would have to be taught at her home vs coming to the hospital as her dgt does not drive in the [facility] area and her SIL works throughout the day.    NCM assured pt that an RN from [facility] will teach her at the bedside prior to dc and then the [facility] RN will do the teaching w her dgt in the home .  ? Medication Needs      ? Financial      ? Legal      ? Other        Disposition  ? Expected Discharge Date    Expected Discharge Date: 02/21/17  ? Transportation   Does the patient need discharge transport arranged?: No  Transportation Name, Phone and Availability #1: Son Czarina Gingras - [phone]  Does the patient use Medicaid Transportation?: No  ? Discharge Disposition   Home w HHS and [place].     Vanita Ingles BSN, RN  Integrated Nurse Case Manager  Inpatient Cardiothoracic Surgery   M-F [phone]  O: [phone]  Pg: [phone]

## 2017-02-20 NOTE — Progress Notes
NAE ON.  No complaints this morning.  Left groin incision healed nicely; minimal erythema, no fluctuance and nontender.  Blood cx x2 positive for enterococcus.  Continue vancomycin; will ask ID to see for abx duration in setting of new prosthetic valve.  HF following for EF 20% (down from 60% post-TAVR echo).  Continue to diurese and ambulate TID.      Oneal GroutAshley Joy Haegele MD

## 2017-02-20 NOTE — Progress Notes
VSS, ST/Afib on tele. No complaints overnight. Left groion c/d/i. Adequate UOP, +BM.  Will continue to monitor.

## 2017-02-21 LAB — CBC: Lab: 7.9 K/UL — ABNORMAL LOW (ref 4.5–11.0)

## 2017-02-21 LAB — COMPREHENSIVE METABOLIC PANEL: Lab: 137 MMOL/L — ABNORMAL LOW (ref 137–147)

## 2017-02-21 LAB — PROTIME INR (PT): Lab: 1.8 MMOL/L — ABNORMAL HIGH (ref 0.8–1.2)

## 2017-02-21 MED ORDER — PROPOFOL 10 MG/ML IV EMUL 20 ML (INFUSION)(AM)(OR)
INTRAVENOUS | 0 refills | Status: DC
Start: 2017-02-21 — End: 2017-02-21
  Administered 2017-02-21: 18:00:00 30 ug/kg/min via INTRAVENOUS

## 2017-02-21 MED ORDER — SODIUM CHLORIDE 0.9 % IV SOLP (OR) 500ML
0 refills | Status: DC
Start: 2017-02-21 — End: 2017-02-21
  Administered 2017-02-21: 18:00:00 via INTRAVENOUS

## 2017-02-21 MED ORDER — WARFARIN 2 MG PO TAB
2 mg | Freq: Every evening | ORAL | 0 refills | Status: DC
Start: 2017-02-21 — End: 2017-02-23
  Administered 2017-02-22 – 2017-02-23 (×2): 2 mg via ORAL

## 2017-02-21 MED ORDER — LIDOCAINE (PF) 200 MG/10 ML (2 %) IJ SYRG
0 refills | Status: DC
Start: 2017-02-21 — End: 2017-02-21
  Administered 2017-02-21 (×2): 60 mg via INTRAVENOUS

## 2017-02-21 MED ORDER — KETAMINE 10 MG/ML IJ SOLN
0 refills | Status: DC
Start: 2017-02-21 — End: 2017-02-21
  Administered 2017-02-21: 18:00:00 20 mg via INTRAVENOUS
  Administered 2017-02-21: 18:00:00 10 mg via INTRAVENOUS

## 2017-02-21 MED ORDER — CEFTRIAXONE INJ 2GM IVP
2 g | Freq: Two times a day (BID) | INTRAVENOUS | 0 refills | Status: DC
Start: 2017-02-21 — End: 2017-02-23
  Administered 2017-02-21 – 2017-02-23 (×5): 2 g via INTRAVENOUS

## 2017-02-21 MED ORDER — PROPOFOL INJ 10 MG/ML IV VIAL
0 refills | Status: DC
Start: 2017-02-21 — End: 2017-02-21
  Administered 2017-02-21: 18:00:00 10 mg via INTRAVENOUS
  Administered 2017-02-21: 18:00:00 20 mg via INTRAVENOUS
  Administered 2017-02-21: 18:00:00 10 mg via INTRAVENOUS
  Administered 2017-02-21: 18:00:00 20 mg via INTRAVENOUS
  Administered 2017-02-21: 18:00:00 10 mg via INTRAVENOUS
  Administered 2017-02-21: 18:00:00 20 mg via INTRAVENOUS

## 2017-02-21 NOTE — Anesthesia Pre-Procedure Evaluation
Anesthesia Pre-Procedure Evaluation    Name: Michelle Solis      MRN: [id number]     DOB: 1940-08-24     Age: 76 y.o.     Sex: female   __________________________________________________________________________     Procedure Date:  02/21/2017  Procedure: TEE     Physical Assessment  Vital Signs (last filed in past 24 hours):  BP: 130/77 (12/18 0721)  Temp: 36.3 ???C (97.3 ???F) (12/18 1191)  Pulse: 101 (12/18 0721)  Respirations: 18 PER MINUTE (12/18 0721)  SpO2: 96 % (12/18 0721)  O2 Delivery: Nasal Cannula (12/18 0721)  Weight: 122.9 kg (271 lb) (12/18 0600)      Patient History  Allergies   Allergen Reactions   ??? Avelox [Moxifloxacin] UNKNOWN   ??? Ciprofloxacin UNKNOWN   ??? Sulfa (Sulfonamide Antibiotics) UNKNOWN        Current Medications    Medication Directions   acetaminophen (TYLENOL) 325 mg tablet Take 2 Tabs by mouth every 4 hours as needed.  Patient taking differently: Take 650 mg by mouth every 6 hours as needed.   albuterol-ipratropium (DUO-NEB, DUO-VENT) 0.5 mg-3 mg(2.5 mg base)/3 mL nebulizer solution Inhale 3 mL solution by nebulizer as directed twice daily.   amitriptyline (ELAVIL) 50 mg tablet Take 50 mg by mouth at bedtime daily.   aspirin EC 81 mg tablet Take 1 Tab by mouth daily.   atorvastatin (LIPITOR) 40 mg tablet Take one tablet by mouth daily.   budesonide respule (PULMICORT) 0.25 mg/2 mL nebulizer solution Inhale 0.25 mg solution by nebulizer as directed twice daily.   Calcium-Cholecalciferol (D3) (CALCIUM 600 + D) 600 mg(1,500mg ) -400 unit tab Take 1 tablet by mouth daily.   dronedarone (MULTAQ) 400 mg tablet Take one tablet by mouth twice daily with meals.   ferrous sulfate 325 mg (65 mg iron) tablet Take 325 mg by mouth twice daily.   furosemide (LASIX) 40 mg tablet TAKE ONE TABLET BY MOUTH ONCE DAILY IN THE MORNING   omeprazole DR(+) (PRILOSEC) 40 mg capsule Take 40 mg by mouth daily.   sodium chloride/aloe vera (AYR SALINE) topical gel Apply  into nose as directed as Needed. tiotropium bromide (SPRIVA RESPIMAT) 1.25 mcg/actuation inhaler Inhale 2 puffs by mouth into the lungs daily.   vitamins, multiple cap Take 1 capsule by mouth daily.   warfarin (COUMADIN) 2 mg tablet Take 2 mg by mouth at bedtime daily.         Review of Systems/Medical History      Patient summary reviewed  Nursing notes reviewed  Pertinent labs reviewed    PONV Screening: Female gender and Non-smoker  No history of anesthetic complications  No family history of anesthetic complications        Pulmonary       Smoker: 80 pack yr hx; quit 199.        COPD, moderate       Pneumonia (hospitalized for PNA in 12/2016)      Shortness of breath (has progressively worsened; last 3-4 months sx's have significantly worsened)      Home oxygen use (chronic resp failure with hypoxia; typically on 2L)      No sleep apnea      12/28/16 PFTs: FEV1/FVC of 90%, a FEV1 of 1.37l (61% pred) and a FVC of 1.54l (51% pred). TLC was 4.8l (92% pred) and RV 2.97l (127% pred). DLco was 87% pred and DLcoHgb 97% pred. DLcoVA 150% pred    12/28/16 CTA chest: patchy ground glass  and alveolar opacities that are scattered throughout both lungs. Mild mediastinal and hilar lymphadenopathy. No signs of bronchiectasis      Cardiovascular       Recent diagnostic studies:          echocardiogram          08/05/16 TTE:   Overall left ventricular systolic function is reduced. EF~ 45%  Abnormal septal motion  Calcific aortic valve stenosis: AV area ~ 0.7 cm2  No significant pericardial effusion  Normal chamber dimensions  Mild LVH  Normal diastolic function  Normal aortic root dimensions  ???  Echo appears similar to that of 2017: MPG across AV is the same but valve area calculates less. EF is slightly decreased    01/05/17 TEE: shows a severe aortic stenosis with a diameter of 0.9cm2    12/27/16 Cath:   1. 20% mid RCA disease.  Otherwise no obstructive coronary artery disease.  2. Hemostasis achieved with TR Band to the right radial artery access site. 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 EF~ 45%  Abnormal septal motion  Calcific aortic valve stenosis: AV area ~ 0.7 cm2  No significant pericardial effusion  Normal chamber dimensions  Mild LVH  Normal diastolic function  Normal aortic root dimensions    02/17/17 EF ~20%  ???  Echo appears similar to that of 2017: MPG across AV is the same but valve area calculates less. 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; residual symptoms of increased emotional lability and mathematics difficulties      Musculoskeletal         Arthritis (low back; limits functional status significantly)      Fractures (proximal femur fx in 06/2013)      Endocrine/Other       No diabetes      Anemia (IDA; req'd transfusion previously; tolerated well; taking Fe supplements)      Obesity   Physical Exam    Airway Findings      Mallampati: II      TM distance: >3 FB      Neck ROM: full      Mouth opening: good      Airway patency: adequate  Dental Findings:             Cardiovascular Findings:       Rhythm: irregular        Other findings: murmur    Pulmonary Findings:       Breath sounds clear to auscultation.    Abdominal Findings: Obese       Diagnostic Tests  Hematology:   Lab Results   Component Value Date    HGB 9.6 02/21/2017    HCT 29.9 02/21/2017    PLTCT 271 02/21/2017    WBC 7.9 02/21/2017    NEUT 93 02/16/2017    ANC 8.30 02/16/2017    ALC 0.30 02/16/2017    MONA 4 02/16/2017    AMC 0.40 02/16/2017    EOSA 0 02/16/2017    ABC 0.00 02/16/2017    MCV 89.1 02/21/2017    MCH 28.6 02/21/2017    MCHC 32.1 02/21/2017    MPV 7.4 02/21/2017    RDW 17.9 02/21/2017         General Chemistry:   Lab Results   Component Value Date    NA 137 02/21/2017    K 3.4 02/21/2017    CL 97 02/21/2017    CO2 34 02/21/2017    GAP 6 02/21/2017    BUN 16 02/21/2017    CR 1.02 02/21/2017    GLU 134 02/21/2017    CA 9.4 02/21/2017    ALBUMIN 3.3 02/21/2017    LACTIC 2.3 01/26/2016    MG 2.0 02/19/2017    TOTBILI 0.4 02/21/2017    PO4 4.3 06/14/2013      Coagulation:   Lab Results   Component Value Date    PTT 24.5 02/16/2017    INR 1.8 02/21/2017         Anesthesia Plan    ASA score: 4   Plan: general and MAC  Special equipment/procedures: TEE and Art line  Induction method: intravenous  NPO status: acceptable      Informed Consent  Anesthetic plan and risks discussed with patient.  Use of blood products discussed with patient  Blood Consent: consented      Plan discussed with: anesthesiologist and CRNA.

## 2017-02-21 NOTE — Progress Notes
VSS, ST/Aflutter on tele. No acute changes overnight. Adequate UOP. Will continue to monitor.

## 2017-02-21 NOTE — Progress Notes
Heart Failure Progress Note    NAME:Michelle Solis                                                                   MRN: [id number]                 DOB:April 09, 1940          AGE: 76 y.o.  ADMISSION DATE: 02/16/2017             DAYS ADMITTED: LOS: 5 days     Assessment:  Active Problems:    Chronic A-fib (HCC)    COPD (chronic obstructive pulmonary disease) (HCC)    Obesity, Class III, BMI 40-49.9 (morbid obesity) (HCC)    Coronary artery disease involving native coronary artery of native heart without angina pectoris    Chronic respiratory failure with hypoxia, on home oxygen therapy (HCC)    Bronchiectasis (HCC)    Chronic anticoagulation    S/P TAVR (transcatheter aortic valve replacement)    Chronic HFrEF (heart failure with reduced ejection fraction) (HCC)    Wound infection after surgery    Candidal dermatitis     RECOMMENDATIONS:    - TEE today  - continue IV lasix 40mg  daily  - recommend starting losartan 25mg  po daily  - recommending starting amiodarone 200mg  daily in place of multaq  - Follow-up appointments made with heart failure.    Patient was discussed with Dr. Kathreen Cosier on HF rounds.    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   She presents with signs of hypervolemia with bi  ventricular failure with signs of low flow state.  Admission BNP: 100  Goal Output: 1.5-2 L/24hr  Intake/Output:  Entire stay net: - , Last 24 hr net: -  Goal Dry Weight: unknown Admission Weight: 125.9 kg (277 lb 9.6 oz)        Most recent weights (inpatient):   Vitals:    02/18/17 0200 02/20/17 0657 02/21/17 0600   Weight: 125.9 kg (277 lb 9.6 oz) 124.2 kg (273 lb 12.8 oz) 122.9 kg (271 lb)        GDMT PTA Changes   BB no    ACEI/ARB/ARNI no 12/18: recommend losartan 25mg  daily   Aldosterone Antagonist no 12/17: spironolactone 25mg  daily   Hydralazine/Nitrate no    Ivabradine no    HRMT  No (No prior history of HF)     Anticoagulation for Afib/flutter Yes-warfarin    Antiarrhythmics Yes-multaq 400mg  po BID 12/16: last dose at 0830  12/18: recommend amiodarone 200mg  daily       Diuretic Therapy    Prior to admission dose    Lasix 40mg  po daily   Given on admission  Daily Dosing   12/16: lasix 40mg  po x 1, lasix 40mg  IV x 1  12/17: lasix 40mg  po x 1, lasix 40mg  IV x 1  12/18: lasix 40mg  IV x 1       Enterococcal bacteremia   - infection stemming from the groin cut down site from TAVR  - ID consulted   > TEE today  > currently on ampicillin/rocephin  > anticipate discharge home with IV abx    Atrial fibrillation  - PTA multaq and warfarin  - Multaq dc'd 0830 on 12/16 due to newly discovered depressed EF  > recommend starting amiodarone 200mg  po daily     S/p TAVR 02/01/17          _____________________________________________________________________________    Subjective:     Today she reports dyspnea on exertion, abdominal fullness and muscle cramping. She is discouraged about the course of events and not feeling better following her TAVR last month.  Symptoms of heart failure show no change.    She denies lightheadedness, near syncope, chest pain, abdominal fullness and peripheral edema.    Overnight Events:none      Objective:    albuterol 0.5% (PROVENTIL; VENTOLIN) nebulizer solution 2.5 mg 2.5 mg Inhalation BID & PRN   amiodarone (CORDARONE) tablet 400 mg 400 mg Oral BID   amitriptyline (ELAVIL) tablet 50 mg 50 mg Oral QHS ampicillin (OMNIPEN) 2 g in sodium chloride 0.9% (NS) 100 mL IVPB (MB+) 2 g Intravenous Q4H*   aspirin EC tablet 81 mg 81 mg Oral QDAY   atorvastatin (LIPITOR) tablet 40 mg 40 mg Oral QDAY   budesonide respule (PULMICORT) nebulizer solution 0.25 mg 0.25 mg Inhalation BID   cefTRIAXone (ROCEPHIN) IVP 2 g 2 g Intravenous Q12H*   ferrous sulfate (FEOSOL, FEROSUL) tablet 325 mg 325 mg Oral BID   fluticasone (FLONASE) nasal spray 2 spray 2 spray Each Nostril QDAY   furosemide (LASIX) injection 40 mg 40 mg Intravenous QDAY   nitrofurantoin monohyd/m-cryst (MACROBID) capsule 100 mg 100 mg Oral BID   nystatin (NYSTOP) topical powder  Topical BID   pantoprazole DR (PROTONIX) tablet 40 mg 40 mg Oral QDAY(21)   polyethylene glycol 3350 (MIRALAX) packet 17 g 1 packet Oral QDAY   spironolactone (ALDACTONE) tablet 25 mg 25 mg Oral QDAY   tiotropium (SPIRIVA) capsule for inhaler 1 capsule 1 capsule Inhalation QDAY   warfarin (COUMADIN) tablet 1 mg 1 mg Oral QHS     acetaminophen Q4H PRN, ondansetron (ZOFRAN) IV Q6H PRN, oxyCODONE Q6H PRN                       Vital Signs:  Last Filed                Vital Signs: 24 Hour Range   BP: 130/77 (12/18 0721)  Temp: 36.3 ???C (97.3 ???F) (12/18 1610)  Pulse: 101 (12/18 0721)  Respirations: 18 PER MINUTE (12/18 0721)  SpO2: 96 % (12/18 0721)  O2 Delivery: Nasal Cannula (12/18 0721)  BP: (110-146)/(53-82)   Temp:  [36.3 ???C (97.3 ???F)-36.8 ???C (98.2 ???F)]   Pulse:  [100-120]   Respirations:  [16 PER MINUTE-20 PER MINUTE]   SpO2:  [93 %-98 %]   O2 Delivery: Nasal Cannula    Intensity Pain Scale (Self Report): 4       PHYSICAL EXAMINATION:   General:  Alert, Obese, cooperative, no distress   HEENT:  Normocephalic, atraumatic, Conjunctivae/corneas clear, EOM intact, MMM   Neck:  Supple,  no carotid bruit, JVD ~6-7 cm sitting upright, mild HJR   Lungs:  Decreased breath sounds bilaterally   Chest wall:  No significant tenderness or deformity Heart:  Regular rate and rhythm, distant heart sounds, S1/S2, unable to appreciate any murmur/rub/gallop   Abdomen:  Soft, non-tender, normal bowel sounds, no palpable masses   Extremities:  Extremities warm and well perfused, no cyanosis, no lower extremity edema,  no calf tenderness   Pulses:  Symmetric and 2+ in all extremities   Neurologic:  CNII - XII grossly intact, no gross sensory-motor deficit noted.      Laboratory Review:   24-hour labs:    Results for orders placed or performed during the hospital encounter of 02/16/17 (from the past 24 hour(s))   CBC    Collection Time: 02/20/17  9:47 AM   Result Value Ref Range    White Blood Cells 8.3 4.5 - 11.0 K/UL    RBC 3.66 (L) 4.0 - 5.0 M/UL    Hemoglobin 10.3 (L) 12.0 - 15.0 GM/DL    Hematocrit 45.4 (L) 36 - 45 %    MCV 89.3 80 - 100 FL    MCH 28.1 26 - 34 PG    MCHC 31.5 (L) 32.0 - 36.0 G/DL    RDW 09.8 (H) 11 - 15 %    Platelet Count 287 150 - 400 K/UL    MPV 7.6 7 - 11 FL   BASIC METABOLIC PANEL    Collection Time: 02/20/17  9:47 AM   Result Value Ref Range    Sodium 140 137 - 147 MMOL/L    Potassium 3.7 3.5 - 5.1 MMOL/L    Chloride 100 98 - 110 MMOL/L    CO2 30 21 - 30 MMOL/L    Anion Gap 10 3 - 12    Glucose 220 (H) 70 - 100 MG/DL    Blood Urea Nitrogen 13 7 - 25 MG/DL    Creatinine 1.19 0.4 - 1.00 MG/DL    Calcium 9.4 8.5 - 14.7 MG/DL    eGFR Non African American 60 (L) >60 mL/min    eGFR African American >60 >60 mL/min   PROTIME INR (PT)    Collection Time: 02/21/17  4:17 AM   Result Value Ref Range    INR 1.8 (H) 0.8 - 1.2   CBC    Collection Time: 02/21/17  4:17 AM   Result Value Ref Range    White Blood Cells 7.9 4.5 - 11.0 K/UL    RBC 3.35 (L) 4.0 - 5.0 M/UL    Hemoglobin 9.6 (L) 12.0 - 15.0 GM/DL    Hematocrit 82.9 (L) 36 - 45 %    MCV 89.1 80 - 100 FL    MCH 28.6 26 - 34 PG    MCHC 32.1 32.0 - 36.0 G/DL    RDW 56.2 (H) 11 - 15 %    Platelet Count 271 150 - 400 K/UL    MPV 7.4 7 - 11 FL   COMPREHENSIVE METABOLIC PANEL Collection Time: 02/21/17  4:17 AM   Result Value Ref Range    Sodium 137 137 - 147 MMOL/L    Potassium 3.4 (L) 3.5 - 5.1 MMOL/L    Chloride 97 (L) 98 - 110 MMOL/L    Glucose 134 (H) 70 - 100 MG/DL    Blood Urea Nitrogen 16 7 - 25 MG/DL    Creatinine 1.30 (H) 0.4 - 1.00 MG/DL    Calcium 9.4 8.5 - 86.5 MG/DL  Total Protein 6.4 6.0 - 8.0 G/DL    Total Bilirubin 0.4 0.3 - 1.2 MG/DL    Albumin 3.3 (L) 3.5 - 5.0 G/DL    Alk Phosphatase 76 25 - 110 U/L    AST (SGOT) 9 7 - 40 U/L    CO2 34 (H) 21 - 30 MMOL/L    ALT (SGPT) 5 (L) 7 - 56 U/L    Anion Gap 6 3 - 12    eGFR Non African American 53 (L) >60 mL/min    eGFR African American >60 >60 mL/min       Point of Care Testing:  (Last 24 hours):  Glucose: (!) 134 (02/21/17 0417)    Tele/ECG: Afib/ST rates ~100    Echocardiogram Details:  Echo Results  (Last 3 results in the past 3 years)    Echo EF LVIDD LA Size IVS LVPW Rest PAP    (02/17/17)  20 (02/17/17)  4.58 (02/17/17)  4.33 (02/17/17)  0.89 (02/17/17)  0.86 (02/17/17)  N/A    (02/02/17)  65 (02/02/17)  5.14 (02/02/17)  5.11 (02/02/17)  1.29 (02/02/17)  1.01 (02/02/17)  N/A    (01/05/17)  45 (08/05/16)  4.50 (08/05/16)  4.40 (08/05/16)  1.20 (08/05/16)  1.00 (08/05/16)  N/A           Chest X-Ray 12/17:  Findings compatible with mild CHF/volume overload    Other Radiology/Diagnostics Review:     >Arterial doppler LLE 12/13:  Complex right groin subcutaneous fluid collection most consistent with a   hematoma. A thrombosed femoral artery pseudoaneurysm could produce a   similar appearance. No significant associated hyperemia to suggest   abscess.

## 2017-02-21 NOTE — Progress Notes
02/21/17 1005   Cardiac Rehab Activity   Distance Walked (feet) 150 ft   BP Pre-activity 140/81  (RN notified)   BP Post-activity (!) 177/102  (RN notified)   HR Pre-activity 113 bpm   HR Post-activity 124   SaO2 Pre-activity 96 %   SaO2 Post-activity 94   O2 Device Nasal Cannula   O2 (lpm) 2 LPM   Comments Pt. agreeable to ambulate but wants to stay close to the room due to frequent urination. Pt. ambulated with a wheeled walker. Pt. tolerated ambulation well and was assisted back to the chair at the completion. RN aware of BP readings.   Mobility   Progressive Mobility Level 8   Level of Assistance Stand by assistance   Assistive Device Walker   Time Tolerated 0-10 minutes   Activity Limited By Other (Comment)  (Frequent urination due to lasix)

## 2017-02-21 NOTE — Progress Notes
CARDIOTHORACIC SURGERY DAILY PROGRESS NOTE    PROCEDURE: TAVR right groin cutdown 11/28 Dr Darlyne Russian.     SUBJECTIVE:   Overnight events: Went home with Provena superficial wound vac due to body habitus and home health. She states that she had a fever recently as high as 102. Her Provena was removed 12/7.       ASSESSMENT:  Active Problems:    Chronic A-fib (HCC)    COPD (chronic obstructive pulmonary disease) (HCC)    Obesity, Class III, BMI 40-49.9 (morbid obesity) (HCC)    Coronary artery disease involving native coronary artery of native heart without angina pectoris    Chronic respiratory failure with hypoxia, on home oxygen therapy (HCC)    Bronchiectasis (HCC)    Chronic anticoagulation    S/P TAVR (transcatheter aortic valve replacement)    Chronic HFrEF (heart failure with reduced ejection fraction) (HCC)    Wound infection after surgery    Candidal dermatitis      PLAN:  1. CV - ST/atrial fib with ectopy/occasional small pause, HR low 100's. BP 120-135s. Cont ASA. Not on BB. Chronic A fib. PTA Multaq and coumadin. Multaq changed to Amio PER EP. INR on admission was 2.7-->2.3-->2.1-->1.8-->1.8. ECHO: LVEF=20% With Abnormal Septal Motion. Dramatically New And Declined From 01/16/17  2. Resp - CXR no effusion. Cont IS, wean O2, aggressive pulm toilet. 2L NC 95%  3. Renal - creatinine stable 0.79-->0.92-->1.02. UOP -3.2L/24hrs. Lasix 40mg  IV QD -1kg since admit  4. GI - +BM 12/16  5. ID/Heme - WBC 7.7-->6.5-->6.3-->7.9. Afebrile. Hgb 9.0-->9.6. Positive blood cultures- gram + strep. Enterococcus.   6. Endo - FSBS stable on current regimen.   7. Activity - Cardiac rehab TID. Up ad lib. Walked 244ft yesterday   8. Disposition - Increase coumadin to home dose- 2mg . ABX will be needed for 4 weeks per ID. Patient to have PICC placed today         OBJECTIVE:  Vitals:    02/20/17 1910 02/20/17 2308 02/21/17 0310 02/21/17 0520   BP: 110/53 124/73 129/75    Pulse: 110 102 100 100 Temp: 36.7 ???C (98.1 ???F) 36.8 ???C (98.2 ???F) 36.7 ???C (98.1 ???F)    SpO2: 96% 95% 98% 95%   Weight:       Height:           Physical Exam:  General: A&O x 3  Cardiovascular: RRR no rub or murmur  Respiratory: LS CTA bil  GI: soft, NT, +BS  Extremities: No Edema  Incisions: left groin incision is healed. No drainage. Minimal erythema. No fluctuance noted         LABS:  Lab Results   Component Value Date/Time    WBC 7.9 02/21/2017 04:17 AM    HGB 9.6 (L) 02/21/2017 04:17 AM    HCT 29.9 (L) 02/21/2017 04:17 AM    PLTCT 271 02/21/2017 04:17 AM          Lab Results   Component Value Date/Time    NA 137 02/21/2017 04:17 AM    K 3.4 (L) 02/21/2017 04:17 AM    CL 97 (L) 02/21/2017 04:17 AM    CO2 34 (H) 02/21/2017 04:17 AM    BUN 16 02/21/2017 04:17 AM    CR 1.02 (H) 02/21/2017 04:17 AM    GLU 134 (H) 02/21/2017 04:17 AM    Lab Results   Component Value Date    MG 2.0 02/19/2017     Lab Results   Component Value Date  PO4 4.3 (H) 06/14/2013           Lab Results   Component Value Date    GLUPOC 128 (H) 02/03/2017    GLUPOC 117 (H) 02/03/2017    GLUPOC 120 (H) 02/02/2017    GLUPOC 124 (H) 02/02/2017    GLUPOC 99 02/02/2017    GLUPOC 128 (H) 02/02/2017    GLUPOC 203 (H) 02/01/2017    GLUPOC 170 (H) 02/01/2017       Verina Galeno, PA-C  [phone]

## 2017-02-21 NOTE — Progress Notes
02/21/17 1544   Cardiac Rehab Activity   Distance Walked (feet) 250 ft   BP Pre-activity 118/53   BP Post-activity 142/78   HR Pre-activity 115 bpm   HR Post-activity 116   SaO2 Pre-activity 94 %   SaO2 Post-activity 94   O2 Device Nasal Cannula   O2 (lpm) 2 LPM   Comments Pt ambulated with a wheeled walker. Pt stopped for one seated rest break.    Mobility   Progressive Mobility Level 8   Level of Assistance Stand by assistance   Assistive Device Walker   Time Tolerated 0-10 minutes   Activity Limited By Patient request to stop

## 2017-02-21 NOTE — Progress Notes
IV team consulted for PICC line, placement is awaiting results of cultures that were drawn at 2100 on 12/17. When results return please re-consult IV team for placement.

## 2017-02-21 NOTE — Progress Notes
Pre-Operative Assessment for TEE or Cardioversion    Date of Service:  02/21/2017  Michelle Solis is a 76 y.o. y.o. female.     DOB: May 05, 1940                  MRN#:  [id number]      Procedure to be performed: TEE  Expected Procedure Date:  02/21/17  Indication: Bacteremia     Patient appears alert and oriented: Yes  NPO: for greater than 8 hours  Inpatient IV status: 20g right upper arm    Isolation status: None  Last TEE date: 01/05/17  Last Cardioversion date: None      Anticoagulation Results   Anticoagulant: warfarin and aspirin 81mg   Missed dose: No    Last MAC INR Flow Sheet Entry:    Last recorded Lab results:   INR   Date Value Ref Range Status   02/21/2017 1.8 (H) 0.8 - 1.2 Final   02/20/2017 1.8 (H) 0.8 - 1.2 Final   02/19/2017 2.1 (H) 0.8 - 1.2 Final   02/18/2017 2.3 (H) 0.8 - 1.2 Final   02/16/2017 2.7 (H) 0.8 - 1.2 Final   02/03/2017 1.4 (H) 0.8 - 1.2 Final     APTT   Date Value Ref Range Status   02/16/2017 24.5 24.0 - 36.5 SEC Final     Comment:     NOTE NEW REFERENCE RANGES           Allergies                                        Allergies   Allergen Reactions   ??? Avelox [Moxifloxacin] UNKNOWN   ??? Ciprofloxacin UNKNOWN   ??? Sulfa (Sulfonamide Antibiotics) UNKNOWN          Vitals  Estimated body mass index is 45.1 kg/m??? as calculated from the following:    Height as of this encounter: 1.651 m (5' 5).    Weight as of this encounter: 122.9 kg (271 lb).      Diagnostic Tests  White Blood Cells   Date Value Ref Range Status   02/21/2017 7.9 4.5 - 11.0 K/UL Final     Hemoglobin   Date Value Ref Range Status   02/21/2017 9.6 (L) 12.0 - 15.0 GM/DL Final     Hematocrit   Date Value Ref Range Status   02/21/2017 29.9 (L) 36 - 45 % Final     Platelet Count   Date Value Ref Range Status   02/21/2017 271 150 - 400 K/UL Final     Sodium   Date Value Ref Range Status   02/21/2017 137 137 - 147 MMOL/L Final     Potassium   Date Value Ref Range Status   02/21/2017 3.4 (L) 3.5 - 5.1 MMOL/L Final     Magnesium Date Value Ref Range Status   02/19/2017 2.0 1.6 - 2.6 mg/dL Final     Blood Urea Nitrogen   Date Value Ref Range Status   02/21/2017 16 7 - 25 MG/DL Final     Creatinine   Date Value Ref Range Status   02/21/2017 1.02 (H) 0.4 - 1.00 MG/DL Final     Glucose   Date Value Ref Range Status   02/21/2017 134 (H) 70 - 100 MG/DL Final       Blood Cultures     Microbiology - Resulted Micro  Last 72 Hrs      CULTURE-BLOOD W/SENSITIVITY  Resulted: 02/18/17 2155, Result status: Final result   Ordering provider:  Leland Her, [facility]  02/16/17 2134 Resulting lab:  [place] [facility]    Specimen Information    Source Collected On   Blood 02/16/17 2240          Components    Component Value Flag   Battery Name BLOOD CULTURE  ???   Specimen Description --  ???   Result:       BLOOD  RIGHT  lower   FA     Special Requests NONE  ???   Culture --  ???   Result:       POSITIVE SMEAR:  GRAM POSITIVE COCCI RESEMBLING STREPTOCOCCI  growth in both bottles  CRITICAL VALUE CALLED TO AND READ BACK BY/TIME/TECH  Vernona Rieger B/1320 12.14.18/cl  ENTEROCOCCUS FAECALIS  See susceptibility on duplicate isolate     Report Status --  ???   Result:       FINAL  02/18/2017              CULTURE-BLOOD W/SENSITIVITY (Abnormal)  Resulted: 02/18/17 2154, Result status: Final result   Ordering provider:  Leland Her, APRN,FNP-BC  02/16/17 2134 Resulting lab:  [place] [facility]    Specimen Information    Source Collected On   Blood 02/16/17 2225          Components    Component Value Flag   Battery Name BLOOD CULTURE  ???   Specimen Description --  ???   Result:       BLOOD  LEFT  ANTECUBITAL     Special Requests NONE  ???   Culture --  A   Result:       POSITIVE SMEAR:  GRAM POSITIVE COCCI RESEMBLING STREPTOCOCCI  growth in both bottles  CRITICAL VALUE CALLED TO AND READ BACK BY/TIME/TECH  Vernona Rieger B/1320 12.14.18/cl  ENTEROCOCCUS FAECALIS  If ampicillin is used to treat serious enterococcal infections, it is used in   combination with an aminoglycoside unless high level resistance to aminoglycosides is documented.     Report Status --  ???   Result:       FINAL  02/18/2017     Organism ID ENTEROCOCCUS FAECALIS  ???          Sensitivities     Organism Antibiotic Sensitivity    Enterococcus faecalis Ampicillin 1 SUSCEPTIBLE Susceptible    MIC (MCG/ML) INTERPRETATION      Enterococcus faecalis Vancomycin <=0.5 SUSCEPTIBLE Susceptible    MIC (MCG/ML) INTERPRETATION      Enterococcus faecalis Gent Synergy <=500 SUSCEPTIBLE Susceptible    MIC (MCG/ML) INTERPRETATION      Enterococcus faecalis Daptomycin <=1 SUSCEPTIBLE Susceptible    MIC (MCG/ML) INTERPRETATION      Enterococcus faecalis Method MIC (MCG/ML) INTERPRETATION     MIC (MCG/ML) INTERPRETATION                      Echo procedures within the past 30 days:  No results found.        Device Information on File  No results found for: GENERATOR, EPDEVTYP      Current Medications  No current facility-administered medications on file prior to encounter.      Current Outpatient Medications on File Prior to Encounter   Medication Sig Dispense Refill   ??? acetaminophen (TYLENOL) 325 mg tablet Take 2 Tabs by mouth every 4 hours as needed. (Patient  taking differently: Take 650 mg by mouth every 6 hours as needed.) 30 Tab 1   ??? albuterol-ipratropium (DUO-NEB, DUO-VENT) 0.5 mg-3 mg(2.5 mg base)/3 mL nebulizer solution Inhale 3 mL solution by nebulizer as directed twice daily.     ??? amitriptyline (ELAVIL) 50 mg tablet Take 50 mg by mouth at bedtime daily.     ??? aspirin EC 81 mg tablet Take 1 Tab by mouth daily. 90 Tab 1   ??? atorvastatin (LIPITOR) 40 mg tablet Take one tablet by mouth daily. 90 tablet 3   ??? budesonide respule (PULMICORT) 0.25 mg/2 mL nebulizer solution Inhale 0.25 mg solution by nebulizer as directed twice daily.     ??? Calcium-Cholecalciferol (D3) (CALCIUM 600 + D) 600 mg(1,500mg ) -400 unit tab Take 1 tablet by mouth daily.     ??? dronedarone (MULTAQ) 400 mg tablet Take one tablet by mouth twice daily with meals. 60 tablet 11 ??? ferrous sulfate 325 mg (65 mg iron) tablet Take 325 mg by mouth twice daily.     ??? furosemide (LASIX) 40 mg tablet TAKE ONE TABLET BY MOUTH ONCE DAILY IN THE MORNING 90 tablet 3   ??? omeprazole DR(+) (PRILOSEC) 40 mg capsule Take 40 mg by mouth daily.     ??? sodium chloride/aloe vera (AYR SALINE) topical gel Apply  into nose as directed as Needed.     ??? tiotropium bromide (SPRIVA RESPIMAT) 1.25 mcg/actuation inhaler Inhale 2 puffs by mouth into the lungs daily.     ??? vitamins, multiple cap Take 1 capsule by mouth daily.     ??? warfarin (COUMADIN) 2 mg tablet Take 2 mg by mouth at bedtime daily.           Past Medical History  Past Medical History:   Diagnosis Date   ??? Abnormal chest CT    ??? Aortic stenosis    ??? Atrial flutter (HCC)    ??? Cerebral aneurysm rupture (HCC) 1998   ??? COPD (chronic obstructive pulmonary disease) (HCC)    ??? Dyspnea    ??? GERD (gastroesophageal reflux disease) 09/13/2011   ??? History of rheumatic fever during childhood   ??? Hypertension 09/13/2011   ??? O2 dependent    ??? Seizures (HCC)     off medications since 2000         Past Surgical History  Past Surgical History:   Procedure Laterality Date   ??? HX ENDOVASCULAR ANEURYSM REPAIR  1999    coiling   ??? APPENDECTOMY     ??? BACK SURGERY      low lumbar back rod and screws    ??? CHOLECYSTECTOMY     ??? FEMUR SURGERY      rod inserted   ??? HX HEART CATHETERIZATION     ??? HYSTERECTOMY     ??? KNEE REPLACEMENT     ??? TONSILLECTOMY           Social History     Social History     Tobacco Use   ??? Smoking status: Former Smoker     Packs/day: 3.00     Years: 35.00     Pack years: 105.00     Types: Cigarettes     Last attempt to quit: 03/07/1994     Years since quitting: 22.9   ??? Smokeless tobacco: Never Used   ??? Tobacco comment: smoked up to 3 packs a day   Substance Use Topics   ??? Alcohol use: Yes     Comment: occassional   ???  Drug use: No         Additional Comments:  None      Probe Assessment (only for TEE procedures): Positive for: GERD, Hx. Head/Neck surgery/radiation and Hx. Chest surgery/radiation      Sedation Assessment:   Positive for: COPD / Asthma, O2  and ETOH Rarely ETOH use

## 2017-02-22 LAB — CBC: Lab: 8.5 K/UL — ABNORMAL LOW (ref >40)

## 2017-02-22 LAB — BASIC METABOLIC PANEL: Lab: 141 MMOL/L — ABNORMAL LOW (ref <100)

## 2017-02-22 LAB — PROTIME INR (PT): Lab: 2 mg/dL — ABNORMAL HIGH (ref <150)

## 2017-02-22 MED ORDER — SODIUM CHLORIDE 0.9 % FLUSH
5-10 mL | Freq: Three times a day (TID) | INTRAVENOUS | 0 refills | Status: DC
Start: 2017-02-22 — End: 2017-02-23

## 2017-02-22 MED ORDER — POTASSIUM CHLORIDE 20 MEQ/15 ML PO LIQD
40-60 meq | NASOGASTRIC | 0 refills | Status: DC | PRN
Start: 2017-02-22 — End: 2017-02-23

## 2017-02-22 MED ORDER — POTASSIUM CHLORIDE 20 MEQ PO TBTQ
40-60 meq | ORAL | 0 refills | Status: DC | PRN
Start: 2017-02-22 — End: 2017-02-23
  Administered 2017-02-22: 21:00:00 40 meq via ORAL

## 2017-02-22 MED ORDER — FUROSEMIDE 40 MG PO TAB
40 mg | Freq: Every day | ORAL | 0 refills | Status: DC
Start: 2017-02-22 — End: 2017-02-23
  Administered 2017-02-23: 14:00:00 40 mg via ORAL

## 2017-02-22 NOTE — Progress Notes
PHYSICAL THERAPY  NOTE       Per discussion with OT, patient reports no concerns with mobility with no PT goals identified. Pt is mobilizing independently and is currently receiving cardiac rehab for endurance. PT will discontinue service, please re-consult if the patient has a decline in functional status.       Therapist: Trilby Leaverasey Zackry Deines, PT  Date: 02/22/2017

## 2017-02-22 NOTE — Procedures
PICC Line Insertion Procedure Note    NAME:Michelle Solis                                             MRN: [id number]                 DOB:06/26/1940          AGE: 76 y.o.  ADMISSION DATE: 02/16/2017             DAYS ADMITTED: LOS: 6 days      Procedure Details: Informed consent was obtained for the procedure.  Risks of infection, blood clot, and nerve or vessel damage were discussed.     Indications: Long Term IV therapy     Procedure: Under sterile conditions the skin at the insertion site was prepped with chlorhexadineand covered with a sterile drape. Local anesthesia was applied to the skin and subcutaneous tissues.  A #4 FR, Single, PICC was inserted in theRight Brachial vein per hospital protocol. Blood return:  Yes Catheter trimmed, inserted to 44 cm, with 3 cm external.  Catheter was flushed with 20 mL NS. Patient did tolerate procedure well. Mid upper arm circumference is 34.5 cm.    Verification:Placement confirmed with ECG., Patency verified by positive blood return., Venous location confirmed by ultrasound., Buyer, retailducational material/teaching instruction given to patient and/or left at bedside. and ACJ/DSVC per 3CG    R PICC placed by T.Lawver RN Asst C.Dashiel Bergquist VAT

## 2017-02-22 NOTE — Case Management (ED)
Case Management Progress Note    NAME:Michelle Solis                          MRN: [id number]              DOB:1940-08-20          AGE: 76 y.o.  ADMISSION DATE: 02/16/2017             DAYS ADMITTED: LOS: 6 days      Todays Date: 02/22/2017    Plan  Continue inpt care- [facility]     Interventions  ? Support   Support: Pt/Family Updates re:POC or DC Plan, Patient Education  ? Info or Referral      ? Discharge Planning   Discharge Planning: Home Health, Home Infusion-Enter[facility]-TPN  ? Medication Needs      ? Financial      ? Legal      ? Other        Disposition  ? Expected Discharge Date    Expected Discharge Date: 02/22/17  ? Transportation   Does the patient need discharge transport arranged?: No  Transportation Name, Phone and Availability #1: Son Harriett Rush- [place] - [phone]  Does the patient use Medicaid Transportation?: No  ? Next Level of Care (Acute Psych discharges only)      ? Discharge Disposition                                          Durable Medical Equipment      No service has been selected for the patient.      [place] Dest[facility]      No service has been selected for the patient.      [facility]      No service has been selected for the patient.      [facility]      No service has been selected for the patient.          Don BroachEmily Persis Graffius BSN, RN  Integrated Nurse Case Manager  Inpatient Cardiothoracic Surgery   M-F [phone]  O: [phone]  Pg: [phone]

## 2017-02-22 NOTE — Anesthesia Post-Procedure Evaluation
Post-Anesthesia Evaluation    Name: Michelle DullLinda G Solis      MRN: [id number]     DOB: [birth year]     Age: 76 y.o.     Sex: female   __________________________________________________________________________     Procedure Date: 02/21/2017  Procedure: * No procedures listed *      Surgeon: * No surgeons listed *    Post-Anesthesia Vitals         Post Anesthesia Evaluation Note    Evaluation location: Pre/Post  Patient participation: recovered; patient participated in evaluation  Level of consciousness: alert    Pain score: 0  Pain management: adequate    Hydration: normovolemia  Temperature: 36.0C - 38.4C  Airway patency: adequate    Perioperative Events  Perioperative events:  no       Post-op nausea and vomiting: no PONV    Postoperative Status  Cardiovascular status: hemodynamically stable  Respiratory status: spontaneous ventilation  Follow-up needed: none        Perioperative Events  Perioperative Event: No  Emergency Case Activation: No

## 2017-02-22 NOTE — Progress Notes
RT Adult Assessment Note    NAME:Michelle Solis             MRN: [id number]             DOB:20-Sep-1940          AGE: 76 y.o.  ADMISSION DATE: 02/16/2017             DAYS ADMITTED: LOS: 6 days    RT Treatment Plan:  Protocol Plan: Medications  Albuterol: Neb BID;Neb PRN  Ipratropium: MDI Q day    Protocol Plan: Procedures  PEP Therapy: Place a nursing order for "IS Q1h While Awake" for any of Lung Expansion indicators  Oxygen/Humidity: O2 to keep SpO2 > 92%  Monitoring: Pulse oximetry BID & PRN    Additional Comments:  Impressions of the patient: patient asleep but easily awakens, on 2 LPM. NAD.   Intervention(s)/outcome(s): RT re-evaluation completed. Albuterol, Spiriva & Pulmicort given per home regimen.   Patient education that was completed: n/a  Recommendations to the care team: n/a    Vital Signs:  Pulse:    RR: Respirations: 18 PER MINUTE  SpO2: SpO2: 97 %  O2 Device: $$ O2 Device: Cannula  Liter Flow: O2 Liter Flow: 2 lpm  O2%:    Breath Sounds:    Respiratory Effort: Respiratory Effort: Non-Labored

## 2017-02-22 NOTE — Progress Notes
I have read and agree with the procedure note written by C. Taylor.

## 2017-02-22 NOTE — Progress Notes
Pt had an uneventful night.   VSS, SR/ST c BBB and PAC's/Aflutter.   Pt c/o bilat LE pain, tylenol provided for relief.  Continues to receive IV abx.  Left groin c/d/i, interedry in place.   Adequate UOP.   Will continue to monitor.

## 2017-02-22 NOTE — Progress Notes
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   Patient Stated Goals: Return home  Precautions: Standard;Falls;O2 Requirement;Sternal Precautions(2L NC)  Pain / Complaints: Patient agrees to participate in therapy;Patient has no c/o pain  Comments: Pt eating breakfast upon initial attempt to see for OT tx session & requests OT return later. Upon second attempt, pt returning to room after walking with cardiac rehab.     Objective  Psychosocial Status: Willing and Cooperative to Participate    Home Living  Home Layout: One Level  Financial risk analyst / Tub: Tub/Shower Unit  Bathroom Toilet: Raised  Bathroom Equipment: Engineer, materials in [employer];Shower Chair  Bathroom Accessibility: Not Accessible(Leaves rollator outside of bathroom door)  Home Equipment: Binnie Rail;Shower/Tub Bench;Reacher;Cane(Rollator)    Prior Function  Level Of Independence: Independent with ADLs and functional transfers  Lives With: Alone  Receives Help From: (Paid cleaning/laundry assistance X4 hrs every other week)  Homemaking Tasks: Meal Prep;Driving;Shopping  Other Function Comments: Pt reports modified independence with ADLs and mobility using rollator prior to admission. Pt on 2L O2 NC at baseline. Pt completes LB dressing tasks at EOB; she has adaptive equipment she can use if needed. Pt denies hx of recent falls.     ADL's  Where Assessed: Chair  Eating Assist: Independent  Eating Deficits: No Assist Needed  UE Dressing Assist: (UE AROM WFL for tasks ) LE Dressing Assist: Not Performed  LE Dressing Deficits: (Pt denies concerns; slip-on shoes donned)  Functional Transfer Assist: Stand By Assist  Functional Transfer Deficits: Supervision/Safety;Increased Time to Complete(sit <> stand, mobility with walker)  Comment: Pt completed sit to stand and in-room mobility with roller walker and stand by assist for safety. Pt declined toileting d/t completing prior to OT arrival. Pt reports she completes LB dressing at EOB with special foot stool at home and denies concerns with return to task in home setting.     Activity Tolerance  Endurance: 3/5 Tolerates 25-30 Minutes Exercise w/Multiple Rests  Sitting Balance: 4/5 Moves/Returns Trunkal Midpoint 1-2 Inches in Multiple Planes    Cognition  Overall Cognitive Status: WFL to Adequately Complete Self Care Tasks Safely    UE AROM  Overall BUE AROM WNL: Yes  Coordination: Adequate to Complete ADLs  Grasp: Bilateral Grasp Functional for Activity    UE Strength / Tone  Overall Strength / Tone: WFL Able to Perform ADL Tasks    Education   Comments: OT role, plan of care, home safety education    Assessment  Assessment: Decreased Endurance;Decreased High-Level ADLs  Prognosis: Good  Goal Formulation: Patient  No Skilled OT: No Acute OT Goals Identified(Pt choice)  Comments: Patient's current status and level of safety suggests progression of activity can be achieved with nursing and/or family and does not require occupational therapist intervention.    Plan  Progress: Improving as Expected;Discontinue OT  OT Frequency: No Further Treatment    G-Codes: Self-care  [phone] Current Status:  1-19% Impairment  [id number] Goal Status:  1-19% Impairment  [id number] Discharge Status: 1-19% Impairment       Based on above evaluation and clinical judgment.    OT Discharge Recommendations  OT Discharge Recommendations: Home with family assist, Vs, Home with Home Health  Equipment Recommendations: Patient owns necessary equipment Additional Information: Pt up ad lib in room during this admission; she denies need for ADL training/acute OT intervention, however, may benefit from home health follow-up visit to maximize safety with functional tasks and mobility in home setting at discharge.        Therapist: Darlin Priestly, OTR/L [phone]  Date: 02/22/2017

## 2017-02-22 NOTE — Progress Notes
1430- IV team paged. Pt's PICC line c small/moderate amount bleeding at insertion site.

## 2017-02-22 NOTE — Progress Notes
CARDIOTHORACIC SURGERY DAILY PROGRESS NOTE    PROCEDURE: TAVR right groin cutdown 11/28 Dr Darlyne Russian.     SUBJECTIVE:   Overnight events: Went home with Provena superficial wound vac due to body habitus and home health. She states that she had a fever recently as high as 102. Her Provena was removed 12/7.       ASSESSMENT:  Active Problems:    Chronic A-fib (HCC)    COPD (chronic obstructive pulmonary disease) (HCC)    Obesity, Class III, BMI 40-49.9 (morbid obesity) (HCC)    Coronary artery disease involving native coronary artery of native heart without angina pectoris    Chronic respiratory failure with hypoxia, on home oxygen therapy (HCC)    Bronchiectasis (HCC)    Chronic anticoagulation    S/P TAVR (transcatheter aortic valve replacement)    Chronic HFrEF (heart failure with reduced ejection fraction) (HCC)    Wound infection after surgery    Candidal dermatitis      PLAN:  1. CV - ST/atrial fib with ectopy/occasional small pause, HR low 100's. BP 120-135s. Cont ASA. Not on BB. Chronic A fib. PTA Multaq and coumadin. Multaq changed to Amio PER EP. INR on admission was 2.7-->2.3-->2.1-->1.8-->1.8-->2.0. ECHO: TEE yesterday- LVEF 60%. No evidence of vegetations  2. Resp - CXR no effusion. Cont IS, wean O2, aggressive pulm toilet. 2L NC 97%  3. Renal - creatinine stable 0.79-->0.92-->1.02-->1.15. UOP -3.0L/24hrs. Lasix 40mg  IV QD -7kg since admit  4. GI - +BM 12/16  5. ID/Heme - WBC 7.7-->6.5-->6.3-->7.9-->8.5. Afebrile. Hgb 9.0-->9.6-->9.1. Positive blood cultures- gram + strep. Enterococcus. BC drawn 12/17- no growth x1 day   6. Endo - FSBS stable on current regimen.   7. Activity - Cardiac rehab TID. Up ad lib. Walked 228ft yesterday   8. Disposition - Wean O2. Plan for PICC Thrs then home. ID following. Home infusion and HH will be needed. Will not add losartan at this time due to sCrt trending upward and not in HF.          OBJECTIVE:  Vitals:    02/21/17 1935 02/21/17 2305 02/22/17 0328 02/22/17 0521 BP: 120/72 118/66 135/64    Pulse: 112 103 101    Temp: 36.5 ???C (97.7 ???F) 36.6 ???C (97.9 ???F) 36.7 ???C (98.1 ???F)    SpO2: 96% 96% 95% 97%   Weight:       Height:           Physical Exam:  General: A&O x 3  Cardiovascular: RRR no rub or murmur  Respiratory: LS CTA bil  GI: soft, NT, +BS  Extremities: No Edema  Incisions: left groin incision is healed. No drainage. Minimal erythema. No fluctuance noted         LABS:  Lab Results   Component Value Date/Time    WBC 8.5 02/22/2017 04:20 AM    HGB 9.1 (L) 02/22/2017 04:20 AM    HCT 28.3 (L) 02/22/2017 04:20 AM    PLTCT 278 02/22/2017 04:20 AM          Lab Results   Component Value Date/Time    NA 141 02/22/2017 04:20 AM    K 3.3 (L) 02/22/2017 04:20 AM    CL 99 02/22/2017 04:20 AM    CO2 34 (H) 02/22/2017 04:20 AM    BUN 16 02/22/2017 04:20 AM    CR 1.15 (H) 02/22/2017 04:20 AM    GLU 127 (H) 02/22/2017 04:20 AM    Lab Results   Component Value Date  MG 2.0 02/19/2017     Lab Results   Component Value Date    PO4 4.3 (H) 06/14/2013           Lab Results   Component Value Date    GLUPOC 128 (H) 02/03/2017    GLUPOC 117 (H) 02/03/2017    GLUPOC 120 (H) 02/02/2017    GLUPOC 124 (H) 02/02/2017    GLUPOC 99 02/02/2017    GLUPOC 128 (H) 02/02/2017    GLUPOC 203 (H) 02/01/2017    GLUPOC 170 (H) 02/01/2017       Michelle Wauters, PA-C  [phone]

## 2017-02-22 NOTE — Progress Notes
Heart Failure Progress Note    NAME:Michelle Solis                                                                   MRN: [id number]                 DOB:02-12-1941          AGE: 76 y.o.  ADMISSION DATE: 02/16/2017             DAYS ADMITTED: LOS: 6 days     Assessment:  Active Problems:    Chronic A-fib (HCC)    COPD (chronic obstructive pulmonary disease) (HCC)    Obesity, Class III, BMI 40-49.9 (morbid obesity) (HCC)    Coronary artery disease involving native coronary artery of native heart without angina pectoris    Chronic respiratory failure with hypoxia, on home oxygen therapy (HCC)    Bronchiectasis (HCC)    Chronic anticoagulation    S/P TAVR (transcatheter aortic valve replacement)    Chronic HFrEF (heart failure with reduced ejection fraction) (HCC)    Wound infection after surgery    Candidal dermatitis     RECOMMENDATIONS:    - change lasix to 40mg  daily with extra 40mg  if needed for weight gain for discharge  - continue spironolactone 25mg  daily  - Follow-up appointments made with heart failure-12/31 with Dr. Pierre Solis in [place]  - heart failure to sign off    Patient was seen and discussed with Dr. Kathreen Cosier on HF rounds.    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   She presents with signs of hypervolemia with bi  ventricular failure with signs of low flow state.  Admission BNP: 100  Goal Output: 1.5-2 L/24hr Intake/Output:  Entire stay net: -12.81mL, Last 24 hr net: -  Goal Dry Weight: unknown   Admission Weight: 125.9 kg (277 lb 9.6 oz)        Most recent weights (inpatient):   Vitals:    02/21/17 0600 02/21/17 1242 02/22/17 0600   Weight: 122.9 kg (271 lb) 123 kg (271 lb 2.7 oz) 121.6 kg (268 lb)        GDMT PTA Changes   BB no    ACEI/ARB/ARNI no    Aldosterone Antagonist no 12/17: spironolactone 25mg  daily   Hydralazine/Nitrate no    Ivabradine no    HRMT  No (No prior history of HF)     Anticoagulation for Afib/flutter Yes-warfarin    Antiarrhythmics Yes-multaq 400mg  po BID 12/16: last dose at 0830  12/18: start amiodarone        Diuretic Therapy    Prior to admission dose    Lasix 40mg  po daily   Given on admission  Daily Dosing   12/16: lasix 40mg  po x 1, lasix 40mg  IV x 1  12/17: lasix 40mg  po x 1, lasix 40mg  IV x 1  12/18: lasix 40mg  IV x 1  12/19: lasix 40mg  IV  X 1       Enterococcal bacteremia   - infection stemming from the groin cut down site from TAVR  - ID consulted   > TEE today  > currently on ampicillin/rocephin  > anticipate discharge home with IV abx    Atrial fibrillation  - PTA multaq and warfarin  - Multaq dc'd 0830 on 12/16 due to newly discovered depressed EF  > start amiodarone 200mg  po daily     S/p TAVR 02/01/17          _____________________________________________________________________________    Subjective:     Today she reports dyspnea on exertion, abdominal fullness and muscle cramping. She feels better today.  Symptoms of heart failure show no change.    She denies lightheadedness, near syncope, chest pain, abdominal fullness and peripheral edema.    Overnight Events:none      Objective:    albuterol 0.5% (PROVENTIL; VENTOLIN) nebulizer solution 2.5 mg 2.5 mg Inhalation BID & PRN   amiodarone (CORDARONE) tablet 400 mg 400 mg Oral BID   amitriptyline (ELAVIL) tablet 50 mg 50 mg Oral QHS   ampicillin (OMNIPEN) 2 g in sodium chloride 0.9% (NS) 100 mL IVPB (MB+) 2 g Intravenous Q4H*   aspirin EC tablet 81 mg 81 mg Oral QDAY   atorvastatin (LIPITOR) tablet 40 mg 40 mg Oral QDAY   budesonide respule (PULMICORT) nebulizer solution 0.25 mg 0.25 mg Inhalation BID   cefTRIAXone (ROCEPHIN) IVP 2 g 2 g Intravenous Q12H*   ferrous sulfate (FEOSOL, FEROSUL) tablet 325 mg 325 mg Oral BID   fluticasone (FLONASE) nasal spray 2 spray 2 spray Each Nostril QDAY   furosemide (LASIX) tablet 40 mg 40 mg Oral QDAY   nitrofurantoin monohyd/m-cryst (MACROBID) capsule 100 mg 100 mg Oral BID   nystatin (NYSTOP) topical powder  Topical BID   pantoprazole DR (PROTONIX) tablet 40 mg 40 mg Oral QDAY(21)   polyethylene glycol 3350 (MIRALAX) packet 17 g 1 packet Oral QDAY   spironolactone (ALDACTONE) tablet 25 mg 25 mg Oral QDAY   tiotropium (SPIRIVA) capsule for inhaler 1 capsule 1 capsule Inhalation QDAY   warfarin (COUMADIN) tablet 2 mg 2 mg Oral QHS     acetaminophen Q4H PRN, ondansetron (ZOFRAN) IV Q6H PRN, oxyCODONE Q6H PRN                       Vital Signs:  Last Filed                Vital Signs: 24 Hour Range   BP: 124/66 (12/19 0755)  Temp: 36.6 ???C (97.9 ???F) (12/19 0755)  Pulse: 111 (12/19 0755)  Respirations: 18 PER MINUTE (12/19 0755)  SpO2: 93 % (12/19 0755)  O2 Delivery: Nasal Cannula (12/19 0755)  Height: 165.1 cm (5' 5) (12/18 1242)  BP: (93-141)/(53-119)   Temp:  [36.5 ???C (97.7 ???F)-36.8 ???C (98.2 ???F)]   Pulse:  [58-123]   Respirations:  [15 PER MINUTE-21 PER MINUTE]   SpO2:  [92 %-98 %]   O2 Delivery: Nasal Cannula    Intensity Pain Scale (Self Report): 4       PHYSICAL EXAMINATION:   General:  Alert, Obese, cooperative, no distress   HEENT:  Normocephalic, atraumatic, Conjunctivae/corneas clear, EOM intact, MMM  Neck:  Supple, no carotid bruit, JVD ~6 cm sitting upright, mild HJR   Lungs:  Decreased breath sounds bilaterally, continues on O2 per NC   Chest wall:  No significant tenderness or deformity   Heart:  Regular rate and rhythm, distant heart sounds, S1/S2, unable to appreciate any murmur/rub/gallop   Abdomen:  Soft, non-tender, normal bowel sounds, no palpable masses   Extremities:  Extremities warm and well perfused, no cyanosis, no lower extremity edema,  no calf tenderness   Pulses:  Symmetric and 2+ in all extremities   Neurologic:  CNII - XII grossly intact, no gross sensory-motor deficit noted.      Laboratory Review:   24-hour labs:    Results for orders placed or performed during the hospital encounter of 02/16/17 (from the past 24 hour(s))   PROTIME INR (PT)    Collection Time: 02/22/17  4:20 AM   Result Value Ref Range    INR 2.0 (H) 0.8 - 1.2   CBC    Collection Time: 02/22/17  4:20 AM   Result Value Ref Range    White Blood Cells 8.5 4.5 - 11.0 K/UL    RBC 3.21 (L) 4.0 - 5.0 M/UL    Hemoglobin 9.1 (L) 12.0 - 15.0 GM/DL    Hematocrit 16.1 (L) 36 - 45 %    MCV 88.3 80 - 100 FL    MCH 28.4 26 - 34 PG    MCHC 32.2 32.0 - 36.0 G/DL    RDW 09.6 (H) 11 - 15 %    Platelet Count 278 150 - 400 K/UL    MPV 7.4 7 - 11 FL   BASIC METABOLIC PANEL    Collection Time: 02/22/17  4:20 AM   Result Value Ref Range    Sodium 141 137 - 147 MMOL/L    Potassium 3.3 (L) 3.5 - 5.1 MMOL/L    Chloride 99 98 - 110 MMOL/L    CO2 34 (H) 21 - 30 MMOL/L    Anion Gap 8 3 - 12    Glucose 127 (H) 70 - 100 MG/DL    Blood Urea Nitrogen 16 7 - 25 MG/DL    Creatinine 0.45 (H) 0.4 - 1.00 MG/DL    Calcium 9.0 8.5 - 40.9 MG/DL    eGFR Non African American 46 (L) >60 mL/min    eGFR African American 56 (L) >60 mL/min       Point of Care Testing:  (Last 24 hours):  Glucose: (!) 127 (02/22/17 0420)    Tele/ECG: Afib rates ~100    Echocardiogram Details:  Echo Results  (Last 3 results in the past 3 years)    Echo EF LVIDD LA Size IVS LVPW Rest PAP    (02/21/17)  25 (02/17/17)  4.58 (02/17/17)  4.33 (02/17/17)  0.89 (02/17/17)  0.86 (02/17/17)  N/A    (02/17/17)  20 (02/02/17)  5.14 (02/02/17)  5.11 (02/02/17)  1.29 (02/02/17)  1.01 (02/02/17)  N/A    (02/02/17)  65 (08/05/16)  4.50 (08/05/16)  4.40 (08/05/16) 1.20 (08/05/16)  1.00 (08/05/16)  N/A           Chest X-Ray 12/17:  Findings compatible with mild CHF/volume overload    Other Radiology/Diagnostics Review:     >Arterial doppler LLE 12/13:  Complex right groin subcutaneous fluid collection most consistent with a   hematoma. A thrombosed femoral artery pseudoaneurysm could produce a   similar appearance. No significant associated hyperemia to suggest   abscess.

## 2017-02-22 NOTE — Case Management (ED)
Case Management Progress Note    NAME:Michelle Solis                          MRN: [id number]              DOB:08/14/1940          AGE: 76 y.o.  ADMISSION DATE: 02/16/2017             DAYS ADMITTED: LOS: 6 days      Today???s Date: 02/22/2017    Plan  Wean O2. Plan for PICC Wed/Th then home. ID following.    Interventions  ? Support   Support: Pt/Family Updates re:POC or DC Plan, Patient Education  ? Info or Referral      ? Discharge Planning   Discharge Planning: Home Health, Home Infusion-Enteral-TPN    NCM updated KUHI on POC-    Pt still needing a PICC placed- NCM awaiting to hear from IV therapy if they will place the PICC now that we have a preliminary results of negative culture after a little over 24 hours ( cultures drawn on 12/17 at 2150).    If PICC can be placed today, then will plan for bedside teach for the IV abx tomorrow between 1000-1100. If the PICC can't be placed today, we will then have to re-evaluate when to do the teaching session.    NCM has HI/HH orders pended in O2 which include nursing for IV therapy/PT/and INR checks.   Confirmed that pt does have her home portable o2 tank at the bedside and NCM checked to ensure it is a full tank to transport home.     Update 1204: Confirmed w IV team pt is on the list to have PICC placed today.   ? Medication Needs      ? Financial      ? Legal      ? Other        Disposition  ? Expected Discharge Date    Expected Discharge Date: 02/22/17  ? Transportation   Does the patient need discharge transport arranged?: No  Transportation Name, Phone and Availability #1: Son Ayzia Day - [phone]  Does the patient use Medicaid Transportation?: No  ? Discharge Disposition     Durable Medical Equipment      No service has been selected for the patient.      [place] Destination      No service has been selected for the patient.      [facility] - Selection Complete      Service Provider Request Status Selected Services Address Phone Number Fax Number [facility] [address], [place] [address] [phone]      [facility] - Selection Complete      Service Provider Request Status Selected Services Address Phone Number Fax Number    [facility] INFUSION Selected Home Infusion and Injection [address], [place] [address] [phone]          Don Broach BSN, RN  Integrated Nurse Case Manager  Inpatient Cardiothoracic Surgery   M-F [phone]  O: [phone]  Pg: [phone]

## 2017-02-22 NOTE — Progress Notes
Infectious Diseases Progress Note    Today's Date:  02/21/2017  Admission Date: 02/16/2017      Assessment:     Active Problems:    Chronic A-fib (HCC)    COPD (chronic obstructive pulmonary disease) (HCC)    Obesity, Class III, BMI 40-49.9 (morbid obesity) (HCC)    Coronary artery disease involving native coronary artery of native heart without angina pectoris    Chronic respiratory failure with hypoxia, on home oxygen therapy (HCC)    Bronchiectasis (HCC)    Chronic anticoagulation    S/P TAVR (transcatheter aortic valve replacement)    Chronic HFrEF (heart failure with reduced ejection fraction) (HCC)    Wound infection after surgery    Candidal dermatitis    E. Fecalis bacteremia   -12/12 patient reported temp of 102 at home   -12/13 presented to ED, tachycardic but otherwise vitals normal, afebrile. WBC 8.9.   -12/13 started on vancomycin for L groin cellulitis   -12/13 BC positive for E. 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   12/17 BC-     L groin cellulitis   - 11/28 TAVR via L groin. Cutdown repaired, superficial wound vac placed  - 12/7 Seen in clinic, wound vac removed. Groin noted to be well healed, no drainage or redness.   - 12/12 first noted fever at home, but had tenderness of area 2-3 days prior   - 12/13 [place] Left groin: compressible subcutaneous fluid collection which measures 8 x 4.8 x 9.6 cm and demonstrates internal debris and septations consistent with hematoma    E. Coli UTI   - 11/27 UC from preop sample, not clean catch, growing E. Coli   - 12/17 started treatment with macrobid - pt not having any dysuria, suprapubic pain, flank pain. Is urinating frequently d/t lasix    Hx AS s/p TAVR   - severe aortic stenosis s/p TAVR 11/28 via left groin     HFrEF w/ newly reduced EF  - 02/02/17 echo with EF of 60%  - 11/ 23/18 LHC & RHC showed 20% mid RCA disease, otherwise normal   - 02/17/17 echo LVEF=20% With Abnormal Septal Motion. Valves Are Poorly Seen, No Vegetations Detected  - per HF team, new reduction may be due to discontinuation of GDMT after TAVR d/t low BP  - HF following    PAF  - pta was on Multaq and warfarin, continuing warfarin and switching to amiodarone per HF team     OSA  COPD   Chronic hypoxic respiratory failure  - intermittently on 2L NC at baseline, pta is on Spiriva Pulmicort, duo nebs  - 10/24 CT showed patchy ground glass opacities and peribronchial cuffing, PFTs with increased DCLO  - seen by pulmonology 01/23/17 for preop eval, above findings possibly attributable to AS and pulmonary edema        Recommendations:     1. Ampicillin 2g q4h and Ceftriaxone 2g q12h as dual antibiotic therapy for bacteriocidal effect. She will need at least 4 weeks of IV antibiotics given her new valve and multiple sites of hardware  2. At discharge plan would  be for Amp to be by continuous infusion- need to make sure that infusion company is agreeable to this as this medication is less stable  3. FU Repeat blood cultures    4. Recommend TEE to further investigate for any valvular vegetations  5. PIC line  6. complete Macrobid for 3 days for uncomplicated UTI.   7. Monitor CBC, CMP for antibiotic toxicity   8. Monitor for new sites of infection via hematogenous seeding; patient has VP shunt, b/l knee replacements, right femoral rods, and spinal hardware.   9. Monitor INR closely while on atbx  10. Case management to work w pt re home IV atbx plan- this is complex and I have some reservation re pt ability to manage unless good family support Complexity of medical decision making is high b/c of the multi-system nature of the infectious disease process and concerns about the complexity of the patient illness including the sensitivity of the organisms being treated, the potential for drug toxicity and interactions, concerns about immunologic function, and interplay of other issues.      Antimicrobial Start date Projected duration & stop date End date for antibiotics stopped already   cefazolin 12/14  12/14   Vancomycin 12/14  12/17   Amp 12/17     Ceftriaxone 12/18                       Estimated Creatinine Clearance: 63.7 mL/min (A) (based on SCr of 1.02 mg/dL (H)).    Interval History     Afebrile VSS  Reports less groin pain. Chronic unchanged cough. Tolerated Ampicillin. No n/v rash. ROS otherwise neg on 14 pt  We reviewed atbx plan,need for [facility]. She is agreeable. Will need case management assistance, I described continuous infusion Amp as outpt and twice daily dosing of other atbx. She reports her daughter can help- refuses to consider SNF  Labs are stable  Repeat Culture in progress     Medications   Scheduled Meds:    albuterol 0.5% (PROVENTIL; VENTOLIN) nebulizer solution 2.5 mg 2.5 mg Inhalation BID & PRN   amiodarone (CORDARONE) tablet 400 mg 400 mg Oral BID   amitriptyline (ELAVIL) tablet 50 mg 50 mg Oral QHS   ampicillin (OMNIPEN) 2 g in sodium chloride 0.9% (NS) 100 mL IVPB (MB+) 2 g Intravenous Q4H*   aspirin EC tablet 81 mg 81 mg Oral QDAY   atorvastatin (LIPITOR) tablet 40 mg 40 mg Oral QDAY   budesonide respule (PULMICORT) nebulizer solution 0.25 mg 0.25 mg Inhalation BID   cefTRIAXone (ROCEPHIN) IVP 2 g 2 g Intravenous Q12H*   ferrous sulfate (FEOSOL, FEROSUL) tablet 325 mg 325 mg Oral BID   fluticasone (FLONASE) nasal spray 2 spray 2 spray Each Nostril QDAY   furosemide (LASIX) injection 40 mg 40 mg Intravenous QDAY   nitrofurantoin monohyd/m-cryst (MACROBID) capsule 100 mg 100 mg Oral BID nystatin (NYSTOP) topical powder  Topical BID   pantoprazole DR (PROTONIX) tablet 40 mg 40 mg Oral QDAY(21)   polyethylene glycol 3350 (MIRALAX) packet 17 g 1 packet Oral QDAY   spironolactone (ALDACTONE) tablet 25 mg 25 mg Oral QDAY   tiotropium (SPIRIVA) capsule for inhaler 1 capsule 1 capsule Inhalation QDAY   warfarin (COUMADIN) tablet 2 mg 2 mg Oral QHS   Continuous Infusions:  PRN and Respiratory Meds:acetaminophen Q4H PRN, ondansetron (ZOFRAN) IV Q6H PRN, oxyCODONE Q6H PRN      Physical Examination  Vital Signs: Last                  Vital Signs: 24 Hour Range   BP: 118/53 (12/18 1522)  Temp: 36.8 ???C (98.2 ???F) (12/18 1522)  Pulse: 102 (12/18 1711)  Respirations: 18 PER MINUTE (12/18 1522)  SpO2: 95 % (12/18 1522)  O2 Delivery: Nasal Cannula (12/18 1522)  Height: 165.1 cm (65) (12/18 1242) BP: (93-141)/(53-119)   Temp:  [36.3 ???C (97.3 ???F)-36.8 ???C (98.2 ???F)]   Pulse:  [58-123]   Respirations:  [15 PER MINUTE-21 PER MINUTE]   SpO2:  [92 %-98 %]   O2 Delivery: Nasal Cannula     General appearance: alert, oriented, NAD.   HEENT: Conj nl, PERRL, EOM grossly intact, mucus membranes moist, no oral lesions/thrush  Neck: JVD elevated,   Lungs: bibasilar crackles, mild diffuse wheezing   Heart: irregularly irregular, with no murmur   Abdomen: Large obese abdomen. soft, non-tender, non-distended, normal bowel sounds,  Left groin with well healed scar, mildly erythematous, non tender. No drainage. Palpable nodule along scar. Right groin clean, no evidence of infection.  Ext:  2+ edema b/l. No clubbing, cyanosis  Skin: no rashes/lesions.   Lymph: no cervical, axillary or inguinal adenopathy  Neuro: moves extremities, CN intact     Lines: PIV    Lab Review   Hematology  Recent Labs      02/19/17   0423  02/20/17   0407  02/20/17   0947  02/21/17   0417   WBC  6.3   --   8.3  7.9   HGB  9.2*   --   10.3*  9.6*   HCT  28.8*   --   32.7*  29.9*   PLTCT  251   --   287  271 INR  2.1*  1.8*   --   1.8*     Chemistry  Recent Labs      02/19/17   0423  02/20/17   0947  02/21/17   0417   NA  139  140  137   K  4.2  3.7  3.4*   CL  104  100  97*   CO2  30  30  34*   BUN  13  13  16    CR  0.89  0.92  1.02*   GFR  >60  60*  53*   GLU  120*  220*  134*   CA  9.1  9.4  9.4   ALBUMIN   --    --   3.3*   ALKPHOS   --    --   76   AST   --    --   9   ALT   --    --   5*   TOTBILI   --    --   0.4       Microbiology, Radiology and other Diagnostics Review   Microbiology data reviewed.      Pertinent radiology images viewed.  Impression:     Loreta Ave, MD

## 2017-02-22 NOTE — Progress Notes
Primary RN called about bleeding from PICC site. Told her to reinforce and place a pressure dressing to site. If bleeding increases, consult IV Therapy again and we will come and assess.

## 2017-02-22 NOTE — Progress Notes
02/22/17 0915   Cardiac Rehab Activity   Distance Walked (feet) 200 ft   BP Post-activity 143/72   HR Pre-activity 115 bpm   HR Post-activity 124   SaO2 Post-activity 97   O2 Device Nasal Cannula   O2 (lpm) 2 LPM   Comments Pt. needed to urgently use the bathroom, was not able to get a full set of vitals prior to ambulation. Pt. ambulated with a steady gait. Pts. ambulation was limited by her need to use the bathroom frequently and urgently. Pt. ambulated with a wheeled walker. Pt. tolerated ambulation well and was assisted back to the chair at the completion.   Mobility   Progressive Mobility Level 8   Level of Assistance Stand by assistance   Assistive Device Walker   Time Tolerated 0-10 minutes   Activity Limited By (Frequent need to use the bathroom.)

## 2017-02-23 ENCOUNTER — Encounter: Admit: 2017-02-23 | Discharge: 2017-02-23 | Payer: MEDICARE

## 2017-02-23 ENCOUNTER — Inpatient Hospital Stay: Admit: 2017-02-16 | Discharge: 2017-02-16 | Payer: MEDICARE

## 2017-02-23 ENCOUNTER — Inpatient Hospital Stay: Admit: 2017-02-17 | Discharge: 2017-02-17 | Payer: MEDICARE

## 2017-02-23 ENCOUNTER — Inpatient Hospital Stay: Admit: 2017-02-21 | Discharge: 2017-02-21 | Payer: MEDICARE

## 2017-02-23 ENCOUNTER — Encounter: Admit: 2017-02-16 | Discharge: 2017-02-16 | Payer: MEDICARE

## 2017-02-23 ENCOUNTER — Inpatient Hospital Stay: Admit: 2017-02-20 | Discharge: 2017-02-20 | Payer: MEDICARE

## 2017-02-23 DIAGNOSIS — Z7982 Long term (current) use of aspirin: ICD-10-CM

## 2017-02-23 DIAGNOSIS — I447 Left bundle-branch block, unspecified: ICD-10-CM

## 2017-02-23 DIAGNOSIS — B372 Candidiasis of skin and nail: ICD-10-CM

## 2017-02-23 DIAGNOSIS — Z96653 Presence of artificial knee joint, bilateral: ICD-10-CM

## 2017-02-23 DIAGNOSIS — K219 Gastro-esophageal reflux disease without esophagitis: ICD-10-CM

## 2017-02-23 DIAGNOSIS — Z9049 Acquired absence of other specified parts of digestive tract: ICD-10-CM

## 2017-02-23 DIAGNOSIS — L03314 Cellulitis of groin: ICD-10-CM

## 2017-02-23 DIAGNOSIS — Z90710 Acquired absence of both cervix and uterus: ICD-10-CM

## 2017-02-23 DIAGNOSIS — R5081 Fever presenting with conditions classified elsewhere: ICD-10-CM

## 2017-02-23 DIAGNOSIS — E785 Hyperlipidemia, unspecified: ICD-10-CM

## 2017-02-23 DIAGNOSIS — I482 Chronic atrial fibrillation: ICD-10-CM

## 2017-02-23 DIAGNOSIS — B952 Enterococcus as the cause of diseases classified elsewhere: ICD-10-CM

## 2017-02-23 DIAGNOSIS — J479 Bronchiectasis, uncomplicated: ICD-10-CM

## 2017-02-23 DIAGNOSIS — I480 Paroxysmal atrial fibrillation: ICD-10-CM

## 2017-02-23 DIAGNOSIS — G4733 Obstructive sleep apnea (adult) (pediatric): ICD-10-CM

## 2017-02-23 DIAGNOSIS — N390 Urinary tract infection, site not specified: ICD-10-CM

## 2017-02-23 DIAGNOSIS — Z87891 Personal history of nicotine dependence: ICD-10-CM

## 2017-02-23 DIAGNOSIS — Z79899 Other long term (current) drug therapy: ICD-10-CM

## 2017-02-23 DIAGNOSIS — Z9981 Dependence on supplemental oxygen: ICD-10-CM

## 2017-02-23 DIAGNOSIS — B9620 Unspecified Escherichia coli [E. coli] as the cause of diseases classified elsewhere: ICD-10-CM

## 2017-02-23 DIAGNOSIS — T8141XA Infection following a procedure, superficial incisional surgical site, initial encounter: Principal | ICD-10-CM

## 2017-02-23 DIAGNOSIS — T8144XA Sepsis following a procedure, initial encounter: ICD-10-CM

## 2017-02-23 DIAGNOSIS — R5381 Other malaise: ICD-10-CM

## 2017-02-23 DIAGNOSIS — Z952 Presence of prosthetic heart valve: ICD-10-CM

## 2017-02-23 DIAGNOSIS — R252 Cramp and spasm: ICD-10-CM

## 2017-02-23 DIAGNOSIS — Z7901 Long term (current) use of anticoagulants: ICD-10-CM

## 2017-02-23 DIAGNOSIS — J9611 Chronic respiratory failure with hypoxia: ICD-10-CM

## 2017-02-23 DIAGNOSIS — I2510 Atherosclerotic heart disease of native coronary artery without angina pectoris: ICD-10-CM

## 2017-02-23 DIAGNOSIS — Z6841 Body Mass Index (BMI) 40.0 and over, adult: ICD-10-CM

## 2017-02-23 DIAGNOSIS — Z982 Presence of cerebrospinal fluid drainage device: ICD-10-CM

## 2017-02-23 DIAGNOSIS — R5383 Other fatigue: ICD-10-CM

## 2017-02-23 DIAGNOSIS — I110 Hypertensive heart disease with heart failure: ICD-10-CM

## 2017-02-23 DIAGNOSIS — I5032 Chronic diastolic (congestive) heart failure: ICD-10-CM

## 2017-02-23 LAB — BASIC METABOLIC PANEL: Lab: 139 MMOL/L — ABNORMAL LOW (ref 137–147)

## 2017-02-23 LAB — CBC: Lab: 9.4 K/UL — ABNORMAL HIGH (ref 4.5–11.0)

## 2017-02-23 LAB — PROTIME INR (PT): Lab: 2.3 g/dL — ABNORMAL HIGH (ref 0.8–1.2)

## 2017-02-23 MED ORDER — WARFARIN 1 MG PO TAB
1 mg | Freq: Every evening | ORAL | 0 refills | Status: DC
Start: 2017-02-23 — End: 2017-02-23

## 2017-02-23 MED ORDER — AMIODARONE 200 MG PO TAB
200 mg | ORAL_TABLET | Freq: Every day | ORAL | 3 refills | 42.00000 days | Status: AC
Start: 2017-02-23 — End: 2017-05-12
  Filled 2017-02-23 (×2): qty 90, 90d supply, fill #1

## 2017-02-23 MED ORDER — WARFARIN 1 MG PO TAB
1 mg | ORAL_TABLET | Freq: Every day | ORAL | 1 refills | Status: SS
Start: 2017-02-23 — End: 2017-04-12

## 2017-02-23 MED ORDER — SPIRONOLACTONE 25 MG PO TAB
25 mg | ORAL_TABLET | Freq: Every day | ORAL | 3 refills | 46.00000 days | Status: AC
Start: 2017-02-23 — End: 2017-05-12
  Filled 2017-02-23 (×2): qty 90, 90d supply, fill #1

## 2017-02-23 MED ORDER — CEFTRIAXONE INJ 2GM IVP
2 g | Freq: Two times a day (BID) | INTRAVENOUS | 0 refills | 2.00000 days | Status: AC
Start: 2017-02-23 — End: 2017-03-23

## 2017-02-23 MED ORDER — AMPICILLIN 2G/100ML NS IVPB (MB+)
2 g | INTRAVENOUS | 0 refills | Status: AC
Start: 2017-02-23 — End: 2017-02-23

## 2017-02-23 MED ORDER — NITROFURANTOIN MONOHYD/M-CRYST 100 MG PO CAP
100 mg | ORAL_CAPSULE | Freq: Two times a day (BID) | ORAL | 0 refills | 7.00000 days | Status: AC
Start: 2017-02-23 — End: 2017-02-23
  Filled 2017-02-23: qty 1, 1d supply

## 2017-02-23 MED ORDER — AMPICILLIN 2G/100ML NS IVPB (MB+)
2 g | INTRAVENOUS | 0 refills | Status: AC
Start: 2017-02-23 — End: 2017-03-23

## 2017-02-23 MED FILL — WARFARIN 1 MG PO TAB: 1 mg | ORAL | 60 days supply | Qty: 60 | Fill #1 | Status: CP

## 2017-02-23 NOTE — Progress Notes
CARDIOTHORACIC SURGERY DAILY PROGRESS NOTE    PROCEDURE: TAVR right groin cutdown 11/28 Dr Darlyne Russian.     SUBJECTIVE:   Overnight events: Went home with Provena superficial wound vac due to body habitus and home health. She states that she had a fever recently as high as 102. Her Provena was removed 12/7.       ASSESSMENT:  Active Problems:    Chronic A-fib (HCC)    COPD (chronic obstructive pulmonary disease) (HCC)    Obesity, Class III, BMI 40-49.9 (morbid obesity) (HCC)    Coronary artery disease involving native coronary artery of native heart without angina pectoris    Chronic respiratory failure with hypoxia, on home oxygen therapy (HCC)    Bronchiectasis (HCC)    Chronic anticoagulation    S/P TAVR (transcatheter aortic valve replacement)    Chronic HFrEF (heart failure with reduced ejection fraction) (HCC)    Wound infection after surgery    Candidal dermatitis      PLAN:  1. CV - ST/atrial fib with ectopy/occasional small pause, HR low 100's. BP 120-135s. Cont ASA. Not on BB. Chronic A fib. PTA Multaq and coumadin. Multaq changed to Amio PER EP. INR on admission was 2.7-->2.3-->2.1-->1.8-->1.8-->2.0-->P. ECHO: TEE 12/18- LVEF 60%. No evidence of vegetations  2. Resp - CXR no effusion. Cont IS, wean O2, aggressive pulm toilet. 1L NC 97%. Patient has home O2  3. Renal - creatinine stable 0.79-->0.92-->1.02-->1.15-->1.16. UOP -2.0L/24hrs. Lasix 40mg  PO QD and Spironolactone 25mg .  -9kg since admit  4. GI - +BM 12/16  5. ID/Heme - WBC 7.7-->6.5-->6.3-->7.9-->8.5-->9.4. Afebrile. Hgb 9.0-->9.6-->9.1-->8.9. Positive blood cultures- gram + strep. Enterococcus. BC drawn 12/17- no growth x 3 day. PICC placed yesterday    6. Endo - FSBS stable on current regimen.   7. Activity - Cardiac rehab TID. Up ad lib. Walked 278ft yesterday   8. Disposition -  INR pending this AM, INR followed [facility] in [place]. ABX teach and home today         OBJECTIVE:  Vitals: 02/22/17 2323 02/23/17 0308 02/23/17 0524 02/23/17 0528   BP: 131/73 131/66     Pulse: 106 103 105 111   Temp: 36.8 ???C (98.2 ???F) 36.6 ???C (97.9 ???F)     SpO2: 94% 93%     Weight:       Height:           Physical Exam:  General: A&O x 3  Cardiovascular: RRR no rub or murmur  Respiratory: LS CTA bil. NC in place   GI: soft, NT, +BS  Extremities: No Edema  Incisions: left groin incision is healed. No drainage. Minimal erythema. No fluctuance noted. Right arm PICC.       LABS:  Lab Results   Component Value Date/Time    WBC 9.4 02/23/2017 02:25 AM    HGB 8.9 (L) 02/23/2017 02:25 AM    HCT 27.6 (L) 02/23/2017 02:25 AM    PLTCT 298 02/23/2017 02:25 AM          Lab Results   Component Value Date/Time    NA 139 02/23/2017 02:25 AM    K 3.7 02/23/2017 02:25 AM    CL 100 02/23/2017 02:25 AM    CO2 34 (H) 02/23/2017 02:25 AM    BUN 17 02/23/2017 02:25 AM    CR 1.16 (H) 02/23/2017 02:25 AM    GLU 129 (H) 02/23/2017 02:25 AM    Lab Results   Component Value Date  MG 2.0 02/19/2017     Lab Results   Component Value Date    PO4 4.3 (H) 06/14/2013           Lab Results   Component Value Date    GLUPOC 128 (H) 02/03/2017    GLUPOC 117 (H) 02/03/2017    GLUPOC 120 (H) 02/02/2017    GLUPOC 124 (H) 02/02/2017    GLUPOC 99 02/02/2017    GLUPOC 128 (H) 02/02/2017    GLUPOC 203 (H) 02/01/2017    GLUPOC 170 (H) 02/01/2017       [place], PA-C  [phone]

## 2017-02-23 NOTE — Progress Notes
NAE ON.  Received PICC line yesterday.  Ambulated in room but not in hallways.  Incision remains dry and well-healed, no erythema.  ID following--plan for home amp administration; [facility]  HF recs in for daily lasix and spironolactone.  Will plan to d/c home today following abx teaching.      Oneal GroutAshley Levii Hairfield MD

## 2017-02-23 NOTE — Progress Notes
1230 - Reviewed discharge instructions, prescriptions/medications, follow-up appt with pt.  Pt states verbal understanding - no questions.  PICC line left in place for ongoing IV abx infusions.  Tele dc'd.  Medications delivered to bedside by pharmacy concierge team.  IV abx in room - teaching for home abx completed this am.  When ready, pt taken to the front door by hospital staff.

## 2017-02-23 NOTE — Progress Notes
Medication Delivery Date/Place:  Medication & supplies delivered to pt room 12.20.18 prior to discharge    Start of Care:  Pt connected to continuous Ampicillin infusion prior to discharge; Ceftriaxone doses to begin 12.20.18 w/ evening dose    Teach:  Bedside teach and connection to continuous infusion [facility] Amy    Drug: Ampicillin 12 gm   Adm Method: CADD Pump  Adm Directions:  Administer Ampicillin 12 gm IV over 24 hours via continuous infusion using CADD pump: Vol = 620 ml; Rate = 25 ml/hr.  Each bag will last: 24 hours    Drug: Ceftriaxone 2 gm  Frequency: Q 12 hours  Adm Method: IV push  Adm Directions: Administer Ceftriaxone 2 gm (20 ml) IV every 12 hours. Infuse over approximately 3-5 minutes via slow IV push    Duration of therapy: minimum 4 weeks    Access: PICC  Flush: NS 5-10 ml before and after Ceftriaxone dose administration    Lab Orders: CBC w/diff, CMP Q Wednesday (1st draw 12.26)  Fax results to: Dr Nadara Eatonlough [phone] & Emogene MorganKUHI [phone]    [facility] [phone]    MD: Loreta AveLisa Clough

## 2017-02-23 NOTE — Progress Notes
PICC in place, home today, discussed with pt. GFM

## 2017-02-24 ENCOUNTER — Encounter: Admit: 2017-02-24 | Discharge: 2017-02-24 | Payer: MEDICARE

## 2017-02-24 NOTE — Progress Notes
ACTIVE OPAT: 02/24/17  Hosp D/C Date: 02/23/17  ID Physician: Nadara Eatonlough  Next f/u: pending ~ 3 wks.     Diagnosis:  Wound infection after surgery    Candidal dermatitis    Abx: Ampicillin 2g IV Q 4 hrs via continuous infusion and Ceftriaxone 2g IV Q 12 hrs both x 4 wks  Start date: 02/20/17 Stop date: ?    Labs: Wkly CBC w/Diff and CMP Q Wed starting 03/01/17.    Line: RUE Picc    [facility]:  Phone [phone] / Fax [phone]    [facility]: [facility]: Ph=[phone] / Fax= [phone]  Confirmed orders with Vernona RiegerLaura who states pt is doing great and teaching went very well.

## 2017-02-27 LAB — CULTURE-BLOOD W/SENSITIVITY

## 2017-03-01 ENCOUNTER — Encounter: Admit: 2017-03-01 | Discharge: 2017-03-01 | Payer: MEDICARE

## 2017-03-01 DIAGNOSIS — R69 Illness, unspecified: Principal | ICD-10-CM

## 2017-03-01 LAB — ALT (SGPT): Lab: 7

## 2017-03-01 LAB — AST (SGOT): Lab: 13

## 2017-03-01 LAB — BUN: Lab: 11

## 2017-03-01 LAB — POTASSIUM: Lab: 3.5

## 2017-03-03 ENCOUNTER — Encounter: Admit: 2017-03-03 | Discharge: 2017-03-03 | Payer: MEDICARE

## 2017-03-03 NOTE — Progress Notes
Creatinine trending up. Pt on Ampicillin and Ceftriaxone. Dr Ezzard StandingNewman who is covering for Dr Nadara Eatonlough notified.     Per Dr Ezzard StandingNewman since both BUN and Eosinophils are normal this is probably not abx related so will continue to monitor and have pt increase fluid intake.     Called pt and discussed increasing her fluid intake. Pt states she really does not drink much so will definitely try to increase her fluid intake. Pt also then wanted to discuss f/u appt. Pt thought that since she was seeing cardiology she did not have to see Dr Nadara Eatonlough. Explained to pt reason for needing a f/u appt with ID since we are managing her infection. Pt agreeable to an appt the wk of 1/7-1/11 after 1330. Offered pt appt on 03/16/17 at 4pm and pt agreeable to this. No other quest at this time. Pt is doing well.

## 2017-03-08 LAB — BUN: Lab: 14

## 2017-03-08 LAB — COMPREHENSIVE METABOLIC PANEL
Lab: 0.3
Lab: 1.1 — ABNORMAL HIGH (ref 0.57–1.11)
Lab: 103 — ABNORMAL LOW (ref 37.0–47.0)
Lab: 139 — ABNORMAL LOW (ref 4.20–5.40)
Lab: 14
Lab: 14
Lab: 16 — ABNORMAL HIGH (ref 0–14)
Lab: 184 — ABNORMAL HIGH (ref 83–110)
Lab: 24
Lab: 3 — ABNORMAL LOW (ref 3.4–4.8)
Lab: 3.6 — ABNORMAL LOW (ref 12.0–16.0)
Lab: 7
Lab: 7.1 — ABNORMAL HIGH (ref 11.5–14.5)
Lab: 9.3
Lab: 96

## 2017-03-08 LAB — CBC
Lab: 8.8
Lab: 8.8

## 2017-03-08 LAB — POTASSIUM: Lab: 3.6

## 2017-03-08 LAB — PLATELET COUNT: Lab: 313

## 2017-03-08 LAB — ALT (SGPT): Lab: 7

## 2017-03-08 LAB — ALK PHOS TOTAL: Lab: 96

## 2017-03-08 LAB — AST (SGOT): Lab: 14

## 2017-03-08 LAB — CREATININE: Lab: 1.1

## 2017-03-08 LAB — HEMOGLOBIN: Lab: 9.5

## 2017-03-10 ENCOUNTER — Encounter: Admit: 2017-03-10 | Discharge: 2017-03-10 | Payer: MEDICARE

## 2017-03-15 LAB — BUN: Lab: 13

## 2017-03-15 LAB — ALT (SGPT): Lab: 8

## 2017-03-15 LAB — AST (SGOT): Lab: 13

## 2017-03-15 LAB — CREATININE: Lab: 1.1

## 2017-03-15 LAB — POTASSIUM: Lab: 3.4

## 2017-03-15 LAB — ALK PHOS TOTAL: Lab: 94

## 2017-03-15 LAB — PLATELET COUNT: Lab: 227

## 2017-03-16 ENCOUNTER — Encounter: Admit: 2017-03-16 | Discharge: 2017-03-16 | Payer: MEDICARE

## 2017-03-16 ENCOUNTER — Ambulatory Visit: Admit: 2017-03-16 | Discharge: 2017-03-16 | Payer: MEDICARE

## 2017-03-16 DIAGNOSIS — J439 Emphysema, unspecified: ICD-10-CM

## 2017-03-16 DIAGNOSIS — Z952 Presence of prosthetic heart valve: ICD-10-CM

## 2017-03-16 DIAGNOSIS — J9611 Chronic respiratory failure with hypoxia: ICD-10-CM

## 2017-03-16 DIAGNOSIS — R7881 Bacteremia: Principal | ICD-10-CM

## 2017-03-17 ENCOUNTER — Encounter: Admit: 2017-03-17 | Discharge: 2017-03-17 | Payer: MEDICARE

## 2017-03-17 DIAGNOSIS — I4892 Unspecified atrial flutter: ICD-10-CM

## 2017-03-17 DIAGNOSIS — K219 Gastro-esophageal reflux disease without esophagitis: ICD-10-CM

## 2017-03-17 DIAGNOSIS — I10 Essential (primary) hypertension: Principal | ICD-10-CM

## 2017-03-17 DIAGNOSIS — I350 Nonrheumatic aortic (valve) stenosis: ICD-10-CM

## 2017-03-17 DIAGNOSIS — I607 Nontraumatic subarachnoid hemorrhage from unspecified intracranial artery: ICD-10-CM

## 2017-03-17 DIAGNOSIS — R569 Unspecified convulsions: ICD-10-CM

## 2017-03-17 DIAGNOSIS — Z9981 Dependence on supplemental oxygen: ICD-10-CM

## 2017-03-17 DIAGNOSIS — Z8679 Personal history of other diseases of the circulatory system: ICD-10-CM

## 2017-03-17 DIAGNOSIS — R9389 Abnormal findings on diagnostic imaging of other specified body structures: ICD-10-CM

## 2017-03-17 DIAGNOSIS — R0600 Dyspnea, unspecified: ICD-10-CM

## 2017-03-17 DIAGNOSIS — J449 Chronic obstructive pulmonary disease, unspecified: ICD-10-CM

## 2017-03-20 ENCOUNTER — Ambulatory Visit: Admit: 2017-03-20 | Discharge: 2017-03-21 | Payer: MEDICARE

## 2017-03-20 DIAGNOSIS — I350 Nonrheumatic aortic (valve) stenosis: Principal | ICD-10-CM

## 2017-03-20 DIAGNOSIS — I10 Essential (primary) hypertension: ICD-10-CM

## 2017-03-20 DIAGNOSIS — Z953 Presence of xenogenic heart valve: ICD-10-CM

## 2017-03-20 MED ORDER — PERFLUTREN LIPID MICROSPHERES 1.1 MG/ML IV SUSP
1-20 mL | Freq: Once | INTRAVENOUS | 0 refills | Status: CP
Start: 2017-03-20 — End: ?

## 2017-03-23 ENCOUNTER — Encounter: Admit: 2017-03-23 | Discharge: 2017-03-23 | Payer: MEDICARE

## 2017-03-23 ENCOUNTER — Ambulatory Visit: Admit: 2017-03-23 | Discharge: 2017-03-24 | Payer: MEDICARE

## 2017-03-23 DIAGNOSIS — R0600 Dyspnea, unspecified: ICD-10-CM

## 2017-03-23 DIAGNOSIS — Z9981 Dependence on supplemental oxygen: ICD-10-CM

## 2017-03-23 DIAGNOSIS — I350 Nonrheumatic aortic (valve) stenosis: ICD-10-CM

## 2017-03-23 DIAGNOSIS — I480 Paroxysmal atrial fibrillation: Principal | ICD-10-CM

## 2017-03-23 DIAGNOSIS — Z8679 Personal history of other diseases of the circulatory system: ICD-10-CM

## 2017-03-23 DIAGNOSIS — I10 Essential (primary) hypertension: Principal | ICD-10-CM

## 2017-03-23 DIAGNOSIS — Z952 Presence of prosthetic heart valve: ICD-10-CM

## 2017-03-23 DIAGNOSIS — J449 Chronic obstructive pulmonary disease, unspecified: ICD-10-CM

## 2017-03-23 DIAGNOSIS — R569 Unspecified convulsions: ICD-10-CM

## 2017-03-23 DIAGNOSIS — I519 Heart disease, unspecified: ICD-10-CM

## 2017-03-23 DIAGNOSIS — I607 Nontraumatic subarachnoid hemorrhage from unspecified intracranial artery: ICD-10-CM

## 2017-03-23 DIAGNOSIS — I2510 Atherosclerotic heart disease of native coronary artery without angina pectoris: ICD-10-CM

## 2017-03-23 DIAGNOSIS — I5032 Chronic diastolic (congestive) heart failure: ICD-10-CM

## 2017-03-23 DIAGNOSIS — K219 Gastro-esophageal reflux disease without esophagitis: ICD-10-CM

## 2017-03-23 DIAGNOSIS — I4892 Unspecified atrial flutter: ICD-10-CM

## 2017-03-23 DIAGNOSIS — R9389 Abnormal findings on diagnostic imaging of other specified body structures: ICD-10-CM

## 2017-03-23 MED ORDER — CARVEDILOL 6.25 MG PO TAB
6.25 mg | ORAL_TABLET | Freq: Two times a day (BID) | ORAL | 3 refills | 90.00000 days | Status: AC
Start: 2017-03-23 — End: ?

## 2017-03-24 ENCOUNTER — Encounter: Admit: 2017-03-24 | Discharge: 2017-03-24 | Payer: MEDICARE

## 2017-04-04 ENCOUNTER — Encounter: Admit: 2017-04-04 | Discharge: 2017-04-04 | Payer: MEDICARE

## 2017-04-04 LAB — CULTURE-BLOOD W/SENSITIVITY

## 2017-04-10 ENCOUNTER — Inpatient Hospital Stay: Admit: 2017-04-10 | Discharge: 2017-04-10 | Payer: MEDICARE

## 2017-04-10 ENCOUNTER — Encounter: Admit: 2017-04-10 | Discharge: 2017-04-10 | Payer: MEDICARE

## 2017-04-10 ENCOUNTER — Encounter: Admit: 2017-04-10 | Discharge: 2017-04-11 | Payer: MEDICARE

## 2017-04-10 DIAGNOSIS — R69 Illness, unspecified: Principal | ICD-10-CM

## 2017-04-10 MED ORDER — TIOTROPIUM BROMIDE 18 MCG IN CPDV
1 | Freq: Every day | RESPIRATORY_TRACT | 0 refills | Status: DC
Start: 2017-04-10 — End: 2017-04-12
  Administered 2017-04-11: 12:00:00 1 via RESPIRATORY_TRACT

## 2017-04-10 MED ORDER — SODIUM CHLORIDE 0.9 % IV SOLP
500 mL | INTRAVENOUS | 0 refills | Status: CP
Start: 2017-04-10 — End: ?
  Administered 2017-04-11: 05:00:00 500 mL via INTRAVENOUS

## 2017-04-10 MED ORDER — ALBUTEROL SULFATE 2.5 MG /3 ML (0.083 %) IN NEBU
2.5 mg | Freq: Two times a day (BID) | RESPIRATORY_TRACT | 0 refills | Status: DC | PRN
Start: 2017-04-10 — End: 2017-04-12
  Administered 2017-04-11 – 2017-04-12 (×2): 2.5 mg via RESPIRATORY_TRACT

## 2017-04-10 MED ORDER — IPRATROPIUM-ALBUTEROL 0.5 MG-3 MG(2.5 MG BASE)/3 ML IN NEBU
3 mL | Freq: Two times a day (BID) | RESPIRATORY_TRACT | 0 refills | Status: DC | PRN
Start: 2017-04-10 — End: 2017-04-11

## 2017-04-10 MED ORDER — ONDANSETRON HCL (PF) 4 MG/2 ML IJ SOLN
4 mg | INTRAVENOUS | 0 refills | Status: DC | PRN
Start: 2017-04-10 — End: 2017-04-12

## 2017-04-10 MED ORDER — METOPROLOL TARTRATE 5 MG/5 ML IV SOLN
2.5 mg | INTRAVENOUS | 0 refills | Status: DC
Start: 2017-04-10 — End: 2017-04-12
  Administered 2017-04-11 – 2017-04-12 (×6): 2.5 mg via INTRAVENOUS

## 2017-04-10 MED ORDER — DEXTROSE 5%-0.45% SODIUM CHLORIDE IV SOLP
INTRAVENOUS | 0 refills | Status: DC
Start: 2017-04-10 — End: 2017-04-12
  Administered 2017-04-11 – 2017-04-12 (×2): 1000.000 mL via INTRAVENOUS

## 2017-04-10 MED ORDER — BUDESONIDE 0.25 MG/2 ML IN NBSP
.25 mg | Freq: Two times a day (BID) | RESPIRATORY_TRACT | 0 refills | Status: DC
Start: 2017-04-10 — End: 2017-04-12
  Administered 2017-04-11 – 2017-04-12 (×2): 0.25 mg via RESPIRATORY_TRACT

## 2017-04-10 MED ORDER — PANTOPRAZOLE 40 MG IV SOLR
40 mg | Freq: Every day | INTRAVENOUS | 0 refills | Status: DC
Start: 2017-04-10 — End: 2017-04-12
  Administered 2017-04-11: 16:00:00 40 mg via INTRAVENOUS

## 2017-04-11 ENCOUNTER — Encounter: Admit: 2017-04-11 | Discharge: 2017-04-11 | Payer: MEDICARE

## 2017-04-11 DIAGNOSIS — R0600 Dyspnea, unspecified: ICD-10-CM

## 2017-04-11 DIAGNOSIS — R569 Unspecified convulsions: ICD-10-CM

## 2017-04-11 DIAGNOSIS — R9389 Abnormal findings on diagnostic imaging of other specified body structures: ICD-10-CM

## 2017-04-11 DIAGNOSIS — K219 Gastro-esophageal reflux disease without esophagitis: ICD-10-CM

## 2017-04-11 DIAGNOSIS — I607 Nontraumatic subarachnoid hemorrhage from unspecified intracranial artery: ICD-10-CM

## 2017-04-11 DIAGNOSIS — J449 Chronic obstructive pulmonary disease, unspecified: ICD-10-CM

## 2017-04-11 DIAGNOSIS — I350 Nonrheumatic aortic (valve) stenosis: ICD-10-CM

## 2017-04-11 DIAGNOSIS — Z8679 Personal history of other diseases of the circulatory system: ICD-10-CM

## 2017-04-11 DIAGNOSIS — Z9981 Dependence on supplemental oxygen: ICD-10-CM

## 2017-04-11 DIAGNOSIS — I4892 Unspecified atrial flutter: ICD-10-CM

## 2017-04-11 DIAGNOSIS — I10 Essential (primary) hypertension: Principal | ICD-10-CM

## 2017-04-11 LAB — PROTIME INR (PT)
Lab: 2.6 M/UL — ABNORMAL HIGH (ref 0.8–1.2)
Lab: 2.5 K/UL — ABNORMAL HIGH (ref >60)

## 2017-04-11 LAB — COMPREHENSIVE METABOLIC PANEL
Lab: 0.4 mg/dL — ABNORMAL LOW (ref 0.3–1.2)
Lab: 1.2 mg/dL — ABNORMAL HIGH (ref 0.4–1.00)
Lab: 10 mg/dL (ref 8.5–10.6)
Lab: 138 MMOL/L — ABNORMAL LOW (ref 137–147)
Lab: 14 U/L (ref 7–40)
Lab: 26 MMOL/L (ref 21–30)
Lab: 44 mL/min — ABNORMAL LOW (ref >60)
Lab: 53 mL/min — ABNORMAL LOW (ref >60)
Lab: 6.9 g/dL — ABNORMAL HIGH (ref 6.0–8.0)
Lab: 8 K/UL (ref 3–12)
Lab: 8 U/L — ABNORMAL LOW (ref 7–56)
Lab: 97 U/L (ref 25–110)

## 2017-04-11 LAB — CBC AND DIFF
Lab: 19 % — ABNORMAL HIGH (ref 11–15)
Lab: 32 g/dL — ABNORMAL HIGH (ref 32.0–36.0)
Lab: 7 % (ref 4–12)
Lab: 8.7 10*3/uL (ref 4.5–11.0)

## 2017-04-11 LAB — MAGNESIUM: Lab: 2.2 mg/dL (ref 1.6–2.6)

## 2017-04-11 LAB — BASIC METABOLIC PANEL
Lab: 138 MMOL/L — ABNORMAL LOW (ref 137–147)
Lab: 9.9 mg/dL — ABNORMAL HIGH (ref >60)

## 2017-04-11 LAB — PTT (APTT): Lab: 36 s — ABNORMAL HIGH (ref 24.0–36.5)

## 2017-04-11 LAB — PHOSPHORUS
Lab: 3 mg/dL (ref 2.0–4.5)
Lab: 3.5 mg/dL — ABNORMAL LOW (ref 2.0–4.5)

## 2017-04-11 MED ORDER — IOPAMIDOL 76 % IV SOLN
80 mL | Freq: Once | INTRAVENOUS | 0 refills | Status: CP
Start: 2017-04-11 — End: ?
  Administered 2017-04-11: 10:00:00 80 mL via INTRAVENOUS

## 2017-04-11 MED ORDER — ACETAMINOPHEN 325 MG PO TAB
650 mg | ORAL | 0 refills | Status: DC | PRN
Start: 2017-04-11 — End: 2017-04-12
  Administered 2017-04-11 – 2017-04-12 (×2): 650 mg via ORAL

## 2017-04-11 MED ORDER — WARFARIN 1 MG PO TAB
1 mg | Freq: Every day | ORAL | 0 refills | Status: DC
Start: 2017-04-11 — End: 2017-04-12
  Administered 2017-04-12 (×2): 1 mg via ORAL

## 2017-04-11 MED ORDER — DIATRIZOATE MEG-DIATRIZOAT SOD 66-10 % PO SOLN
30 mL | Freq: Once | ORAL | 0 refills | Status: CP
Start: 2017-04-11 — End: ?
  Administered 2017-04-11: 10:00:00 30 mL via ORAL

## 2017-04-11 MED ORDER — AMITRIPTYLINE 50 MG PO TAB
50 mg | Freq: Every evening | ORAL | 0 refills | Status: DC
Start: 2017-04-11 — End: 2017-04-12
  Administered 2017-04-12: 02:00:00 50 mg via ORAL

## 2017-04-11 MED ORDER — SODIUM CHLORIDE 0.9 % IJ SOLN
50 mL | Freq: Once | INTRAVENOUS | 0 refills | Status: CP
Start: 2017-04-11 — End: ?
  Administered 2017-04-11: 10:00:00 50 mL via INTRAVENOUS

## 2017-04-11 MED ORDER — AMIODARONE 200 MG PO TAB
200 mg | Freq: Every day | ORAL | 0 refills | Status: DC
Start: 2017-04-11 — End: 2017-04-12
  Administered 2017-04-11 – 2017-04-12 (×2): 200 mg via ORAL

## 2017-04-11 MED ORDER — ATORVASTATIN 40 MG PO TAB
40 mg | Freq: Every day | ORAL | 0 refills | Status: DC
Start: 2017-04-11 — End: 2017-04-12
  Administered 2017-04-11 – 2017-04-12 (×2): 40 mg via ORAL

## 2017-04-11 MED ORDER — PIPERACILLIN/TAZOBACTAM 3.375 G/NS IVPB (MB+)
3.375 g | INTRAVENOUS | 0 refills | Status: DC
Start: 2017-04-11 — End: 2017-04-12
  Administered 2017-04-11 – 2017-04-12 (×10): 3.375 g via INTRAVENOUS

## 2017-04-12 ENCOUNTER — Encounter: Admit: 2017-04-12 | Discharge: 2017-04-12 | Payer: MEDICARE

## 2017-04-12 ENCOUNTER — Ambulatory Visit: Admit: 2017-04-10 | Discharge: 2017-04-11 | Payer: MEDICARE

## 2017-04-12 ENCOUNTER — Inpatient Hospital Stay
Admit: 2017-04-11 | Discharge: 2017-04-12 | Disposition: A | Discharge status: Home / Self Care | Payer: MEDICARE | Source: Other Acute Inpatient Hospital | Admitting: Surgical Critical Care

## 2017-04-12 DIAGNOSIS — Z87891 Personal history of nicotine dependence: ICD-10-CM

## 2017-04-12 DIAGNOSIS — J9611 Chronic respiratory failure with hypoxia: ICD-10-CM

## 2017-04-12 DIAGNOSIS — Z952 Presence of prosthetic heart valve: ICD-10-CM

## 2017-04-12 DIAGNOSIS — Z9981 Dependence on supplemental oxygen: ICD-10-CM

## 2017-04-12 DIAGNOSIS — J449 Chronic obstructive pulmonary disease, unspecified: ICD-10-CM

## 2017-04-12 DIAGNOSIS — Z90710 Acquired absence of both cervix and uterus: ICD-10-CM

## 2017-04-12 DIAGNOSIS — I4892 Unspecified atrial flutter: ICD-10-CM

## 2017-04-12 DIAGNOSIS — Z982 Presence of cerebrospinal fluid drainage device: ICD-10-CM

## 2017-04-12 DIAGNOSIS — K5732 Diverticulitis of large intestine without perforation or abscess without bleeding: ICD-10-CM

## 2017-04-12 DIAGNOSIS — Z96659 Presence of unspecified artificial knee joint: ICD-10-CM

## 2017-04-12 DIAGNOSIS — R0600 Dyspnea, unspecified: ICD-10-CM

## 2017-04-12 DIAGNOSIS — K668 Other specified disorders of peritoneum: Principal | ICD-10-CM

## 2017-04-12 DIAGNOSIS — I2510 Atherosclerotic heart disease of native coronary artery without angina pectoris: ICD-10-CM

## 2017-04-12 DIAGNOSIS — I607 Nontraumatic subarachnoid hemorrhage from unspecified intracranial artery: ICD-10-CM

## 2017-04-12 DIAGNOSIS — Z6841 Body Mass Index (BMI) 40.0 and over, adult: ICD-10-CM

## 2017-04-12 DIAGNOSIS — I5032 Chronic diastolic (congestive) heart failure: ICD-10-CM

## 2017-04-12 DIAGNOSIS — Z7901 Long term (current) use of anticoagulants: ICD-10-CM

## 2017-04-12 DIAGNOSIS — I350 Nonrheumatic aortic (valve) stenosis: ICD-10-CM

## 2017-04-12 DIAGNOSIS — K219 Gastro-esophageal reflux disease without esophagitis: ICD-10-CM

## 2017-04-12 DIAGNOSIS — R569 Unspecified convulsions: ICD-10-CM

## 2017-04-12 DIAGNOSIS — I10 Essential (primary) hypertension: Principal | ICD-10-CM

## 2017-04-12 DIAGNOSIS — I110 Hypertensive heart disease with heart failure: ICD-10-CM

## 2017-04-12 DIAGNOSIS — Z8679 Personal history of other diseases of the circulatory system: ICD-10-CM

## 2017-04-12 DIAGNOSIS — R9389 Abnormal findings on diagnostic imaging of other specified body structures: ICD-10-CM

## 2017-04-12 LAB — MAGNESIUM: Lab: 2.2 mg/dL — ABNORMAL LOW (ref 1.6–2.6)

## 2017-04-12 LAB — CBC: Lab: 30 pg — ABNORMAL LOW (ref 26–34)

## 2017-04-12 LAB — BASIC METABOLIC PANEL
Lab: 138 MMOL/L — ABNORMAL LOW (ref >60)
Lab: 4.3 MMOL/L — ABNORMAL HIGH (ref 3.5–5.1)

## 2017-04-12 LAB — PROTIME INR (PT): Lab: 2.9 mg/dL — ABNORMAL HIGH (ref 0.8–1.2)

## 2017-04-12 MED ORDER — CARVEDILOL 6.25 MG PO TAB
6.25 mg | Freq: Two times a day (BID) | ORAL | 0 refills | Status: DC
Start: 2017-04-12 — End: 2017-04-12
  Administered 2017-04-12: 15:00:00 6.25 mg via ORAL

## 2017-04-12 MED ORDER — ONDANSETRON HCL 4 MG PO TAB
4 mg | ORAL | 0 refills | Status: DC | PRN
Start: 2017-04-12 — End: 2017-04-12

## 2017-04-12 MED ORDER — AMOXICILLIN-POT CLAVULANATE 875-125 MG PO TAB
1 | ORAL_TABLET | Freq: Two times a day (BID) | ORAL | 0 refills | 7.00000 days | Status: AC
Start: 2017-04-12 — End: ?

## 2017-04-12 MED ORDER — PANTOPRAZOLE 40 MG PO TBEC
40 mg | Freq: Every day | ORAL | 0 refills | Status: DC
Start: 2017-04-12 — End: 2017-04-12
  Administered 2017-04-12: 15:00:00 40 mg via ORAL

## 2017-04-13 ENCOUNTER — Encounter: Admit: 2017-04-13 | Discharge: 2017-04-13 | Payer: MEDICARE

## 2017-04-13 LAB — CULTURE-BLOOD W/SENSITIVITY

## 2017-04-19 ENCOUNTER — Encounter: Admit: 2017-04-19 | Discharge: 2017-04-19 | Payer: MEDICARE

## 2017-04-19 DIAGNOSIS — Z79899 Other long term (current) drug therapy: ICD-10-CM

## 2017-04-19 DIAGNOSIS — I480 Paroxysmal atrial fibrillation: Principal | ICD-10-CM

## 2017-04-26 ENCOUNTER — Encounter: Admit: 2017-04-26 | Discharge: 2017-04-26 | Payer: MEDICARE

## 2017-04-26 DIAGNOSIS — R69 Illness, unspecified: Principal | ICD-10-CM

## 2017-04-28 ENCOUNTER — Encounter: Admit: 2017-04-28 | Discharge: 2017-04-28 | Payer: MEDICARE

## 2017-04-28 DIAGNOSIS — R569 Unspecified convulsions: ICD-10-CM

## 2017-04-28 DIAGNOSIS — K219 Gastro-esophageal reflux disease without esophagitis: ICD-10-CM

## 2017-04-28 DIAGNOSIS — J449 Chronic obstructive pulmonary disease, unspecified: ICD-10-CM

## 2017-04-28 DIAGNOSIS — Z8679 Personal history of other diseases of the circulatory system: ICD-10-CM

## 2017-04-28 DIAGNOSIS — I10 Essential (primary) hypertension: Principal | ICD-10-CM

## 2017-04-28 DIAGNOSIS — I607 Nontraumatic subarachnoid hemorrhage from unspecified intracranial artery: ICD-10-CM

## 2017-04-28 DIAGNOSIS — I350 Nonrheumatic aortic (valve) stenosis: ICD-10-CM

## 2017-04-28 DIAGNOSIS — R9389 Abnormal findings on diagnostic imaging of other specified body structures: ICD-10-CM

## 2017-04-28 DIAGNOSIS — R0600 Dyspnea, unspecified: ICD-10-CM

## 2017-04-28 DIAGNOSIS — Z9981 Dependence on supplemental oxygen: ICD-10-CM

## 2017-04-28 DIAGNOSIS — I4892 Unspecified atrial flutter: ICD-10-CM

## 2017-04-29 ENCOUNTER — Encounter: Admit: 2017-04-29 | Discharge: 2017-04-29 | Payer: MEDICARE

## 2017-05-02 LAB — COMPREHENSIVE METABOLIC PANEL
Lab: 102
Lab: 138
Lab: 159 — ABNORMAL HIGH (ref 83–110)
Lab: 24
Lab: 26 — ABNORMAL HIGH (ref 9.8–20.1)
Lab: 4
Lab: 7.6

## 2017-05-02 LAB — MAGNESIUM: Lab: 2.3

## 2017-05-02 LAB — TSH WITH FREE T4 REFLEX: Lab: 4.2

## 2017-05-04 ENCOUNTER — Ambulatory Visit: Admit: 2017-05-04 | Discharge: 2017-05-05 | Payer: MEDICARE

## 2017-05-04 ENCOUNTER — Encounter: Admit: 2017-05-04 | Discharge: 2017-05-04 | Payer: MEDICARE

## 2017-05-04 DIAGNOSIS — I350 Nonrheumatic aortic (valve) stenosis: ICD-10-CM

## 2017-05-04 DIAGNOSIS — I519 Heart disease, unspecified: ICD-10-CM

## 2017-05-04 DIAGNOSIS — E662 Morbid (severe) obesity with alveolar hypoventilation: ICD-10-CM

## 2017-05-04 DIAGNOSIS — Z8679 Personal history of other diseases of the circulatory system: ICD-10-CM

## 2017-05-04 DIAGNOSIS — R9389 Abnormal findings on diagnostic imaging of other specified body structures: ICD-10-CM

## 2017-05-04 DIAGNOSIS — I4892 Unspecified atrial flutter: ICD-10-CM

## 2017-05-04 DIAGNOSIS — K219 Gastro-esophageal reflux disease without esophagitis: ICD-10-CM

## 2017-05-04 DIAGNOSIS — I10 Essential (primary) hypertension: ICD-10-CM

## 2017-05-04 DIAGNOSIS — R569 Unspecified convulsions: ICD-10-CM

## 2017-05-04 DIAGNOSIS — Z9981 Dependence on supplemental oxygen: ICD-10-CM

## 2017-05-04 DIAGNOSIS — I2510 Atherosclerotic heart disease of native coronary artery without angina pectoris: ICD-10-CM

## 2017-05-04 DIAGNOSIS — Z79899 Other long term (current) drug therapy: ICD-10-CM

## 2017-05-04 DIAGNOSIS — Z952 Presence of prosthetic heart valve: ICD-10-CM

## 2017-05-04 DIAGNOSIS — I607 Nontraumatic subarachnoid hemorrhage from unspecified intracranial artery: ICD-10-CM

## 2017-05-04 DIAGNOSIS — I482 Chronic atrial fibrillation, unspecified: Principal | ICD-10-CM

## 2017-05-04 DIAGNOSIS — J449 Chronic obstructive pulmonary disease, unspecified: ICD-10-CM

## 2017-05-04 DIAGNOSIS — I480 Paroxysmal atrial fibrillation: Principal | ICD-10-CM

## 2017-05-04 DIAGNOSIS — R0600 Dyspnea, unspecified: ICD-10-CM

## 2017-05-12 ENCOUNTER — Encounter: Admit: 2017-05-12 | Discharge: 2017-05-12 | Payer: MEDICARE

## 2017-05-12 MED ORDER — AMIODARONE 200 MG PO TAB
200 mg | ORAL_TABLET | Freq: Every day | ORAL | 3 refills | 42.00000 days | Status: AC
Start: 2017-05-12 — End: ?

## 2017-05-12 MED ORDER — SPIRONOLACTONE 25 MG PO TAB
25 mg | ORAL_TABLET | Freq: Every day | ORAL | 3 refills | 46.00000 days | Status: AC
Start: 2017-05-12 — End: ?

## 2017-06-08 ENCOUNTER — Encounter: Admit: 2017-06-08 | Discharge: 2017-06-08 | Payer: MEDICARE

## 2017-06-08 DIAGNOSIS — I607 Nontraumatic subarachnoid hemorrhage from unspecified intracranial artery: ICD-10-CM

## 2017-06-08 DIAGNOSIS — Z8679 Personal history of other diseases of the circulatory system: ICD-10-CM

## 2017-06-08 DIAGNOSIS — I10 Essential (primary) hypertension: Principal | ICD-10-CM

## 2017-06-08 DIAGNOSIS — J449 Chronic obstructive pulmonary disease, unspecified: ICD-10-CM

## 2017-06-08 DIAGNOSIS — R0600 Dyspnea, unspecified: ICD-10-CM

## 2017-06-08 DIAGNOSIS — I350 Nonrheumatic aortic (valve) stenosis: ICD-10-CM

## 2017-06-08 DIAGNOSIS — Z9981 Dependence on supplemental oxygen: ICD-10-CM

## 2017-06-08 DIAGNOSIS — R569 Unspecified convulsions: ICD-10-CM

## 2017-06-08 DIAGNOSIS — R9389 Abnormal findings on diagnostic imaging of other specified body structures: ICD-10-CM

## 2017-06-08 DIAGNOSIS — K219 Gastro-esophageal reflux disease without esophagitis: ICD-10-CM

## 2017-06-08 DIAGNOSIS — I4892 Unspecified atrial flutter: ICD-10-CM

## 2017-07-26 ENCOUNTER — Encounter: Admit: 2017-07-26 | Discharge: 2017-07-26 | Payer: MEDICARE

## 2017-07-26 DIAGNOSIS — I10 Essential (primary) hypertension: Principal | ICD-10-CM

## 2017-07-26 DIAGNOSIS — K219 Gastro-esophageal reflux disease without esophagitis: ICD-10-CM

## 2017-07-26 DIAGNOSIS — R9389 Abnormal findings on diagnostic imaging of other specified body structures: ICD-10-CM

## 2017-07-26 DIAGNOSIS — Z8679 Personal history of other diseases of the circulatory system: ICD-10-CM

## 2017-07-26 DIAGNOSIS — J449 Chronic obstructive pulmonary disease, unspecified: ICD-10-CM

## 2017-07-26 DIAGNOSIS — I350 Nonrheumatic aortic (valve) stenosis: ICD-10-CM

## 2017-07-26 DIAGNOSIS — I607 Nontraumatic subarachnoid hemorrhage from unspecified intracranial artery: ICD-10-CM

## 2017-07-26 DIAGNOSIS — R0600 Dyspnea, unspecified: ICD-10-CM

## 2017-07-26 DIAGNOSIS — Z9981 Dependence on supplemental oxygen: ICD-10-CM

## 2017-07-26 DIAGNOSIS — R569 Unspecified convulsions: ICD-10-CM

## 2017-07-26 DIAGNOSIS — I4892 Unspecified atrial flutter: ICD-10-CM

## 2017-08-19 ENCOUNTER — Encounter: Admit: 2017-08-19 | Discharge: 2017-08-20 | Payer: MEDICARE

## 2017-08-19 ENCOUNTER — Encounter: Admit: 2017-08-19 | Discharge: 2017-08-19 | Payer: MEDICARE

## 2017-08-19 DIAGNOSIS — K5780 Diverticulitis of intestine, part unspecified, with perforation and abscess without bleeding: ICD-10-CM

## 2017-08-19 DIAGNOSIS — R109 Unspecified abdominal pain: ICD-10-CM

## 2017-08-19 MED ORDER — PIPERACILLIN/TAZOBACTAM 3.375 G/NS IVPB (MB+)
3.375 g | INTRAVENOUS | 0 refills | Status: DC
Start: 2017-08-19 — End: 2017-08-23
  Administered 2017-08-20 – 2017-08-23 (×28): 3.375 g via INTRAVENOUS

## 2017-08-19 MED ORDER — IPRATROPIUM-ALBUTEROL 0.5 MG-3 MG(2.5 MG BASE)/3 ML IN NEBU
3 mL | Freq: Two times a day (BID) | RESPIRATORY_TRACT | 0 refills | Status: DC
Start: 2017-08-19 — End: 2017-08-20

## 2017-08-19 MED ORDER — BUDESONIDE 0.25 MG/2 ML IN NBSP
.25 mg | Freq: Two times a day (BID) | RESPIRATORY_TRACT | 0 refills | Status: DC
Start: 2017-08-19 — End: 2017-08-25
  Administered 2017-08-20 – 2017-08-24 (×7): 0.25 mg via RESPIRATORY_TRACT

## 2017-08-19 MED ORDER — IPRATROPIUM BROMIDE 0.02 % IN SOLN
0.5 mg | Freq: Two times a day (BID) | RESPIRATORY_TRACT | 0 refills | Status: DC
Start: 2017-08-19 — End: 2017-08-20
  Administered 2017-08-20: 04:00:00 0.5 mg via RESPIRATORY_TRACT

## 2017-08-19 MED ORDER — TIOTROPIUM BROMIDE 18 MCG IN CPDV
1 | Freq: Every day | RESPIRATORY_TRACT | 0 refills | Status: DC
Start: 2017-08-19 — End: 2017-08-25
  Administered 2017-08-20: 11:00:00 1 via RESPIRATORY_TRACT

## 2017-08-19 MED ORDER — ALBUTEROL SULFATE 2.5 MG/0.5 ML IN NEBU
2.5 mg | Freq: Two times a day (BID) | RESPIRATORY_TRACT | 0 refills | Status: DC
Start: 2017-08-19 — End: 2017-08-20
  Administered 2017-08-20 (×2): 2.5 mg via RESPIRATORY_TRACT

## 2017-08-19 MED ORDER — ONDANSETRON HCL (PF) 4 MG/2 ML IJ SOLN
4-8 mg | INTRAVENOUS | 0 refills | Status: DC | PRN
Start: 2017-08-19 — End: 2017-08-25
  Administered 2017-08-22 – 2017-08-23 (×2): 4 mg via INTRAVENOUS

## 2017-08-19 MED ORDER — FENTANYL CITRATE (PF) 50 MCG/ML IJ SOLN
25-50 ug | INTRAVENOUS | 0 refills | Status: DC | PRN
Start: 2017-08-19 — End: 2017-08-25
  Administered 2017-08-20 (×5): 50 ug via INTRAVENOUS
  Administered 2017-08-21 – 2017-08-22 (×4): 25 ug via INTRAVENOUS
  Administered 2017-08-22: 23:00:00 50 ug via INTRAVENOUS
  Administered 2017-08-22 – 2017-08-23 (×3): 25 ug via INTRAVENOUS

## 2017-08-20 ENCOUNTER — Encounter: Admit: 2017-08-20 | Discharge: 2017-08-20 | Payer: MEDICARE

## 2017-08-20 LAB — CBC AND DIFF
Lab: 0 10*3/uL (ref 0–0.20)
Lab: 0 10*3/uL (ref 0–0.45)
Lab: 39 % (ref 36–45)
Lab: 0 10*3/uL (ref 0–0.45)
Lab: 0.1 10*3/uL (ref 0–0.20)
Lab: 0.3 10*3/uL — ABNORMAL LOW (ref 1.0–4.8)
Lab: 0.9 10*3/uL — ABNORMAL HIGH (ref 0–0.80)
Lab: 1 % — ABNORMAL LOW (ref >60)
Lab: 11 g/dL — ABNORMAL LOW (ref >60)
Lab: 15 10*3/uL — ABNORMAL HIGH (ref 1.8–7.0)
Lab: 16 K/UL — ABNORMAL HIGH (ref >60)
Lab: 6 % — ABNORMAL LOW (ref >60)
Lab: 8.8 FL (ref 7–11)
Lab: 91 % — ABNORMAL HIGH (ref 41–77)
Lab: 91 FL — ABNORMAL LOW (ref 80–100)

## 2017-08-20 LAB — COMPREHENSIVE METABOLIC PANEL
Lab: 1.1 mg/dL (ref 0.3–1.2)
Lab: 13 U/L (ref 7–56)
Lab: 130 mg/dL — ABNORMAL HIGH (ref 70–100)
Lab: 135 MMOL/L — ABNORMAL LOW (ref 137–147)
Lab: 21 U/L (ref 7–40)
Lab: 27 MMOL/L (ref 21–30)
Lab: 29 mg/dL — ABNORMAL HIGH (ref 7–25)
Lab: 3.6 g/dL — ABNORMAL HIGH (ref 3.5–5.0)
Lab: 37 mL/min — ABNORMAL LOW (ref >60)
Lab: 45 mL/min — ABNORMAL LOW (ref >60)
Lab: 6.9 g/dL (ref 6.0–8.0)
Lab: 7 K/UL — ABNORMAL HIGH (ref 3–12)
Lab: 82 U/L — ABNORMAL LOW (ref 25–110)
Lab: 9.5 mg/dL — ABNORMAL HIGH (ref 8.5–10.6)

## 2017-08-20 LAB — BASIC METABOLIC PANEL
Lab: 133 mg/dL — ABNORMAL HIGH (ref 70–100)
Lab: 25 MMOL/L — ABNORMAL LOW (ref >60)
Lab: 55 mL/min — ABNORMAL LOW (ref >60)
Lab: 8.9 mg/dL — ABNORMAL LOW (ref 8.5–10.6)

## 2017-08-20 LAB — MAGNESIUM
Lab: 2 mg/dL (ref 1.6–2.6)
Lab: 2 mg/dL — ABNORMAL LOW (ref >60)

## 2017-08-20 MED ORDER — SODIUM CHLORIDE 0.9 % IV SOLP
INTRAVENOUS | 0 refills | Status: AC
Start: 2017-08-20 — End: ?
  Administered 2017-08-20 – 2017-08-22 (×5): 1000.000 mL via INTRAVENOUS

## 2017-08-20 MED ORDER — ALBUTEROL SULFATE 2.5 MG/0.5 ML IN NEBU
2.5 mg | Freq: Two times a day (BID) | RESPIRATORY_TRACT | 0 refills | Status: DC | PRN
Start: 2017-08-20 — End: 2017-08-25
  Administered 2017-08-20 – 2017-08-24 (×6): 2.5 mg via RESPIRATORY_TRACT

## 2017-08-20 MED ORDER — METOPROLOL TARTRATE 5 MG/5 ML IV SOLN
5 mg | INTRAVENOUS | 0 refills | Status: DC
Start: 2017-08-20 — End: 2017-08-23
  Administered 2017-08-20 – 2017-08-23 (×13): 5 mg via INTRAVENOUS

## 2017-08-20 MED ORDER — ALBUTEROL SULFATE 2.5 MG/0.5 ML IN NEBU
2.5 mg | RESPIRATORY_TRACT | 0 refills | Status: DC | PRN
Start: 2017-08-20 — End: 2017-08-20

## 2017-08-20 MED ORDER — LACTATED RINGERS IV SOLP
1000 mL | INTRAVENOUS | 0 refills | Status: CP
Start: 2017-08-20 — End: ?
  Administered 2017-08-20: 08:00:00 1000 mL via INTRAVENOUS

## 2017-08-21 ENCOUNTER — Encounter: Admit: 2017-08-21 | Discharge: 2017-08-21 | Payer: MEDICARE

## 2017-08-21 DIAGNOSIS — R109 Unspecified abdominal pain: ICD-10-CM

## 2017-08-21 LAB — BASIC METABOLIC PANEL: Lab: 139 MMOL/L — ABNORMAL LOW (ref >60)

## 2017-08-21 LAB — MAGNESIUM: Lab: 2.2 mg/dL — ABNORMAL LOW (ref 1.6–2.6)

## 2017-08-21 LAB — CBC AND DIFF: Lab: 19 % — ABNORMAL HIGH (ref 11–15)

## 2017-08-21 LAB — PHOSPHORUS: Lab: 3.7 mg/dL — ABNORMAL LOW (ref 2.0–4.5)

## 2017-08-21 MED ORDER — FENTANYL CITRATE (PF) 50 MCG/ML IJ SOLN
0 refills | Status: CP
Start: 2017-08-21 — End: ?
  Administered 2017-08-21: 20:00:00 25 ug via INTRAVENOUS

## 2017-08-21 MED ORDER — MIDAZOLAM 1 MG/ML IJ SOLN
1-2 mg | Freq: Once | INTRAVENOUS | 0 refills | Status: AC
Start: 2017-08-21 — End: ?

## 2017-08-21 MED ORDER — FENTANYL CITRATE (PF) 50 MCG/ML IJ SOLN
25-50 ug | Freq: Once | INTRAVENOUS | 0 refills | Status: CP
Start: 2017-08-21 — End: ?
  Administered 2017-08-21: 20:00:00 25 ug via INTRAVENOUS

## 2017-08-22 ENCOUNTER — Encounter: Admit: 2017-08-22 | Discharge: 2017-08-22 | Payer: MEDICARE

## 2017-08-22 DIAGNOSIS — R109 Unspecified abdominal pain: ICD-10-CM

## 2017-08-22 LAB — BASIC METABOLIC PANEL: Lab: 143 MMOL/L — ABNORMAL LOW (ref 137–147)

## 2017-08-22 LAB — CBC AND DIFF: Lab: 0 % — ABNORMAL LOW (ref >60)

## 2017-08-22 LAB — GRAM STAIN

## 2017-08-22 LAB — PHOSPHORUS: Lab: 3.5 mg/dL — ABNORMAL LOW (ref 2.0–4.5)

## 2017-08-22 LAB — MAGNESIUM: Lab: 2.3 mg/dL — ABNORMAL LOW (ref >60)

## 2017-08-23 LAB — BASIC METABOLIC PANEL
Lab: 109 MMOL/L (ref 98–110)
Lab: 3.9 MMOL/L — ABNORMAL HIGH (ref 3.5–5.1)

## 2017-08-23 LAB — CBC AND DIFF
Lab: 11 g/dL — ABNORMAL LOW (ref >60)
Lab: 35 % — ABNORMAL LOW (ref >60)
Lab: 9.2 K/UL — ABNORMAL HIGH (ref >60)

## 2017-08-23 LAB — PHOSPHORUS: Lab: 2.6 mg/dL — ABNORMAL HIGH (ref >60)

## 2017-08-23 LAB — PROTIME INR (PT): Lab: 2 % — ABNORMAL HIGH (ref 0.8–1.2)

## 2017-08-23 MED ORDER — CARVEDILOL 6.25 MG PO TAB
6.25 mg | Freq: Two times a day (BID) | ORAL | 0 refills | Status: DC
Start: 2017-08-23 — End: 2017-08-25
  Administered 2017-08-24 (×2): 6.25 mg via ORAL

## 2017-08-23 MED ORDER — PANTOPRAZOLE 40 MG PO TBEC
40 mg | Freq: Every day | ORAL | 0 refills | Status: DC
Start: 2017-08-23 — End: 2017-08-25
  Administered 2017-08-24: 03:00:00 40 mg via ORAL

## 2017-08-23 MED ORDER — PANTOPRAZOLE 40 MG IV SOLR
40 mg | Freq: Every day | INTRAVENOUS | 0 refills | Status: DC
Start: 2017-08-23 — End: 2017-08-23
  Administered 2017-08-23: 14:00:00 40 mg via INTRAVENOUS

## 2017-08-23 MED ORDER — AMITRIPTYLINE 50 MG PO TAB
50 mg | Freq: Every evening | ORAL | 0 refills | Status: DC
Start: 2017-08-23 — End: 2017-08-25
  Administered 2017-08-24: 03:00:00 50 mg via ORAL

## 2017-08-23 MED ORDER — ENOXAPARIN 300 MG/3 ML SC SOLN
1 mg/kg | Freq: Two times a day (BID) | SUBCUTANEOUS | 0 refills | Status: DC
Start: 2017-08-23 — End: 2017-08-23

## 2017-08-23 MED ORDER — WARFARIN 4 MG PO TAB
2 mg | ORAL | 0 refills | Status: DC
Start: 2017-08-23 — End: 2017-08-25
  Administered 2017-08-24: 03:00:00 2 mg via ORAL

## 2017-08-23 MED ORDER — AMOXICILLIN-POT CLAVULANATE 875-125 MG PO TAB
875 mg | Freq: Two times a day (BID) | ORAL | 0 refills | Status: DC
Start: 2017-08-23 — End: 2017-08-25
  Administered 2017-08-23 – 2017-08-24 (×2): 875 mg via ORAL

## 2017-08-23 MED ORDER — AMIODARONE 200 MG PO TAB
200 mg | Freq: Every day | ORAL | 0 refills | Status: DC
Start: 2017-08-23 — End: 2017-08-25
  Administered 2017-08-23 – 2017-08-24 (×2): 200 mg via ORAL

## 2017-08-23 MED ORDER — POLYETHYLENE GLYCOL 3350 17 GRAM PO PWPK
1 | Freq: Every day | ORAL | 0 refills | Status: DC
Start: 2017-08-23 — End: 2017-08-25

## 2017-08-23 MED ORDER — SPIRONOLACTONE 25 MG PO TAB
25 mg | Freq: Every day | ORAL | 0 refills | Status: DC
Start: 2017-08-23 — End: 2017-08-25
  Administered 2017-08-23 – 2017-08-24 (×2): 25 mg via ORAL

## 2017-08-23 MED ORDER — ASPIRIN 81 MG PO TBEC
81 mg | Freq: Every day | ORAL | 0 refills | Status: DC
Start: 2017-08-23 — End: 2017-08-25
  Administered 2017-08-23 – 2017-08-24 (×2): 81 mg via ORAL

## 2017-08-23 MED ORDER — OXYCODONE 5 MG PO TAB
5-10 mg | ORAL | 0 refills | Status: DC | PRN
Start: 2017-08-23 — End: 2017-08-25

## 2017-08-23 MED ORDER — ATORVASTATIN 40 MG PO TAB
40 mg | Freq: Every day | ORAL | 0 refills | Status: DC
Start: 2017-08-23 — End: 2017-08-25
  Administered 2017-08-23 – 2017-08-24 (×2): 40 mg via ORAL

## 2017-08-23 MED ORDER — WARFARIN 1 MG PO TAB
1 mg | ORAL | 0 refills | Status: DC
Start: 2017-08-23 — End: 2017-08-25

## 2017-08-24 ENCOUNTER — Inpatient Hospital Stay: Admit: 2017-08-21 | Discharge: 2017-08-21 | Payer: MEDICARE

## 2017-08-24 ENCOUNTER — Inpatient Hospital Stay: Admit: 2017-08-22 | Discharge: 2017-08-22 | Payer: MEDICARE

## 2017-08-24 ENCOUNTER — Inpatient Hospital Stay: Admit: 2017-08-24 | Discharge: 2017-08-24 | Payer: MEDICARE

## 2017-08-24 ENCOUNTER — Inpatient Hospital Stay
Admit: 2017-08-20 | Discharge: 2017-08-24 | Disposition: A | Discharge status: Home Health | Payer: MEDICARE | Source: Other Acute Inpatient Hospital | Admitting: Surgical Critical Care

## 2017-08-24 DIAGNOSIS — G919 Hydrocephalus, unspecified: ICD-10-CM

## 2017-08-24 DIAGNOSIS — Z7901 Long term (current) use of anticoagulants: ICD-10-CM

## 2017-08-24 DIAGNOSIS — I2510 Atherosclerotic heart disease of native coronary artery without angina pectoris: ICD-10-CM

## 2017-08-24 DIAGNOSIS — I4891 Unspecified atrial fibrillation: ICD-10-CM

## 2017-08-24 DIAGNOSIS — I5020 Unspecified systolic (congestive) heart failure: ICD-10-CM

## 2017-08-24 DIAGNOSIS — Z7982 Long term (current) use of aspirin: ICD-10-CM

## 2017-08-24 DIAGNOSIS — Z6841 Body Mass Index (BMI) 40.0 and over, adult: ICD-10-CM

## 2017-08-24 DIAGNOSIS — I110 Hypertensive heart disease with heart failure: ICD-10-CM

## 2017-08-24 DIAGNOSIS — B952 Enterococcus as the cause of diseases classified elsewhere: ICD-10-CM

## 2017-08-24 DIAGNOSIS — I671 Cerebral aneurysm, nonruptured: ICD-10-CM

## 2017-08-24 DIAGNOSIS — B9620 Unspecified Escherichia coli [E. coli] as the cause of diseases classified elsewhere: ICD-10-CM

## 2017-08-24 DIAGNOSIS — Z96659 Presence of unspecified artificial knee joint: ICD-10-CM

## 2017-08-24 DIAGNOSIS — J449 Chronic obstructive pulmonary disease, unspecified: ICD-10-CM

## 2017-08-24 DIAGNOSIS — Z90710 Acquired absence of both cervix and uterus: ICD-10-CM

## 2017-08-24 DIAGNOSIS — A419 Sepsis, unspecified organism: Principal | ICD-10-CM

## 2017-08-24 DIAGNOSIS — Z79899 Other long term (current) drug therapy: ICD-10-CM

## 2017-08-24 DIAGNOSIS — N179 Acute kidney failure, unspecified: ICD-10-CM

## 2017-08-24 DIAGNOSIS — Z9981 Dependence on supplemental oxygen: ICD-10-CM

## 2017-08-24 DIAGNOSIS — R6520 Severe sepsis without septic shock: ICD-10-CM

## 2017-08-24 DIAGNOSIS — K219 Gastro-esophageal reflux disease without esophagitis: ICD-10-CM

## 2017-08-24 DIAGNOSIS — Z87891 Personal history of nicotine dependence: ICD-10-CM

## 2017-08-24 DIAGNOSIS — K5720 Diverticulitis of large intestine with perforation and abscess without bleeding: ICD-10-CM

## 2017-08-24 DIAGNOSIS — Z982 Presence of cerebrospinal fluid drainage device: ICD-10-CM

## 2017-08-24 DIAGNOSIS — R1032 Left lower quadrant pain: ICD-10-CM

## 2017-08-24 DIAGNOSIS — K668 Other specified disorders of peritoneum: ICD-10-CM

## 2017-08-24 DIAGNOSIS — K659 Peritonitis, unspecified: ICD-10-CM

## 2017-08-24 DIAGNOSIS — J810 Acute pulmonary edema: ICD-10-CM

## 2017-08-24 DIAGNOSIS — Z952 Presence of prosthetic heart valve: ICD-10-CM

## 2017-08-24 DIAGNOSIS — Z7951 Long term (current) use of inhaled steroids: ICD-10-CM

## 2017-08-24 DIAGNOSIS — Z9049 Acquired absence of other specified parts of digestive tract: ICD-10-CM

## 2017-08-24 LAB — CULTURE-WOUND/TISSUE/FLUID(AEROBIC ONLY)W/SENSITIVITY

## 2017-08-24 LAB — PROTIME INR (PT): Lab: 2.2 — ABNORMAL HIGH (ref 0.8–1.2)

## 2017-08-24 MED ORDER — AMOXICILLIN 500 MG PO CAP
1000 mg | Freq: Two times a day (BID) | ORAL | 0 refills | Status: DC
Start: 2017-08-24 — End: 2017-08-25
  Administered 2017-08-24: 16:00:00 1000 mg via ORAL

## 2017-08-24 MED ORDER — OXYCODONE 5 MG PO TAB
5 mg | ORAL_TABLET | ORAL | 0 refills | Status: SS | PRN
Start: 2017-08-24 — End: 2017-09-13

## 2017-08-24 MED ORDER — AMOXICILLIN-POT CLAVULANATE 875-125 MG PO TAB
875 mg | ORAL_TABLET | Freq: Two times a day (BID) | ORAL | 0 refills | 7.00000 days | Status: AC
Start: 2017-08-24 — End: 2017-09-16

## 2017-08-24 MED ORDER — FUROSEMIDE 40 MG PO TAB
40 mg | Freq: Every day | ORAL | 0 refills | Status: DC
Start: 2017-08-24 — End: 2017-08-25
  Administered 2017-08-24: 21:00:00 40 mg via ORAL

## 2017-08-24 MED ORDER — POLYETHYLENE GLYCOL 3350 17 GRAM PO PWPK
17 g | Freq: Every day | ORAL | 0 refills | Status: SS
Start: 2017-08-24 — End: 2018-05-12

## 2017-08-24 MED ORDER — PANTOPRAZOLE 40 MG PO TBEC
40 mg | ORAL_TABLET | Freq: Every day | ORAL | 1 refills | 90.00000 days | Status: AC
Start: 2017-08-24 — End: 2018-01-25

## 2017-08-24 MED ORDER — AMOXICILLIN 500 MG PO CAP
1000 mg | ORAL_CAPSULE | Freq: Two times a day (BID) | ORAL | 0 refills | 7.00000 days | Status: AC
Start: 2017-08-24 — End: 2017-09-16

## 2017-08-25 ENCOUNTER — Encounter: Admit: 2017-08-25 | Discharge: 2017-08-25 | Payer: MEDICARE

## 2017-08-25 DIAGNOSIS — K651 Peritoneal abscess: Principal | ICD-10-CM

## 2017-08-25 DIAGNOSIS — R197 Diarrhea, unspecified: ICD-10-CM

## 2017-08-25 LAB — C DIFFICILE BY PCR: Lab: NEGATIVE

## 2017-08-29 ENCOUNTER — Encounter: Admit: 2017-08-29 | Discharge: 2017-08-29 | Payer: MEDICARE

## 2017-08-29 DIAGNOSIS — K651 Peritoneal abscess: Principal | ICD-10-CM

## 2017-09-10 ENCOUNTER — Encounter: Admit: 2017-09-10 | Discharge: 2017-09-11 | Payer: MEDICARE

## 2017-09-10 ENCOUNTER — Encounter: Admit: 2017-09-10 | Discharge: 2017-09-10 | Payer: MEDICARE

## 2017-09-10 DIAGNOSIS — K5792 Diverticulitis of intestine, part unspecified, without perforation or abscess without bleeding: ICD-10-CM

## 2017-09-10 DIAGNOSIS — K651 Peritoneal abscess: Principal | ICD-10-CM

## 2017-09-10 DIAGNOSIS — L02211 Cutaneous abscess of abdominal wall: ICD-10-CM

## 2017-09-10 DIAGNOSIS — R69 Illness, unspecified: Principal | ICD-10-CM

## 2017-09-11 ENCOUNTER — Inpatient Hospital Stay
Admit: 2017-09-11 | Discharge: 2017-09-16 | Disposition: A | Discharge status: Skilled Nursing Facility | Payer: MEDICARE | Source: Other Acute Inpatient Hospital | Attending: Critical Care Medicine | Admitting: Critical Care Medicine

## 2017-09-11 ENCOUNTER — Encounter: Admit: 2017-09-11 | Discharge: 2017-09-11 | Payer: MEDICARE

## 2017-09-11 LAB — BASIC METABOLIC PANEL
Lab: 129 mg/dL — ABNORMAL HIGH (ref 70–100)
Lab: 13 mg/dL — ABNORMAL HIGH (ref 7–25)
Lab: 134 MMOL/L — ABNORMAL LOW (ref 137–147)
Lab: 25 MMOL/L (ref 21–30)
Lab: 6 pg (ref 3–12)
Lab: 8.4 mg/dL — ABNORMAL LOW (ref 8.5–10.6)
Lab: >60 mL/min — ABNORMAL LOW (ref >60)

## 2017-09-11 LAB — CBC AND DIFF
Lab: 0 % (ref 0–2)
Lab: 0 10*3/uL (ref 0–0.20)
Lab: 0.1 10*3/uL (ref 0–0.45)
Lab: 0.2 10*3/uL — ABNORMAL LOW (ref 1.0–4.8)
Lab: 1 % (ref 0–5)
Lab: 1.1 10*3/uL — ABNORMAL HIGH (ref 0–0.80)
Lab: 11 10*3/uL — ABNORMAL HIGH (ref 1.8–7.0)
Lab: 12 10*3/uL — ABNORMAL HIGH (ref 4.5–11.0)
Lab: 9 % (ref 4–12)

## 2017-09-11 LAB — PHOSPHORUS: Lab: 2.9 mg/dL — ABNORMAL LOW (ref 2.0–4.5)

## 2017-09-11 LAB — PROTIME INR (PT)
Lab: 3.6 — ABNORMAL HIGH (ref 0.8–1.2)
Lab: 3.7 — ABNORMAL HIGH (ref 0.8–1.2)

## 2017-09-11 LAB — MAGNESIUM: Lab: 1.8 mg/dL — ABNORMAL LOW (ref 1.6–2.6)

## 2017-09-11 MED ORDER — AMITRIPTYLINE 50 MG PO TAB
50 mg | Freq: Every evening | ORAL | 0 refills | Status: DC
Start: 2017-09-11 — End: 2017-09-16
  Administered 2017-09-11 – 2017-09-16 (×6): 50 mg via ORAL

## 2017-09-11 MED ORDER — VANCOMYCIN 2,000 MG IVPB
2000 mg | Freq: Once | INTRAVENOUS | 0 refills | Status: CP
Start: 2017-09-11 — End: ?
  Administered 2017-09-11 (×2): 2000 mg via INTRAVENOUS

## 2017-09-11 MED ORDER — FENTANYL CITRATE (PF) 50 MCG/ML IJ SOLN
25-50 ug | INTRAVENOUS | 0 refills | Status: DC | PRN
Start: 2017-09-11 — End: 2017-09-16
  Administered 2017-09-11: 17:00:00 25 ug via INTRAVENOUS
  Administered 2017-09-13: 22:00:00 50 ug via INTRAVENOUS

## 2017-09-11 MED ORDER — AMIODARONE 200 MG PO TAB
200 mg | Freq: Every day | ORAL | 0 refills | Status: DC
Start: 2017-09-11 — End: 2017-09-16
  Administered 2017-09-11 – 2017-09-16 (×6): 200 mg via ORAL

## 2017-09-11 MED ORDER — CARVEDILOL 6.25 MG PO TAB
6.25 mg | Freq: Two times a day (BID) | ORAL | 0 refills | Status: DC
Start: 2017-09-11 — End: 2017-09-16
  Administered 2017-09-11 – 2017-09-16 (×10): 6.25 mg via ORAL

## 2017-09-11 MED ORDER — ONDANSETRON HCL (PF) 4 MG/2 ML IJ SOLN
4 mg | INTRAVENOUS | 0 refills | Status: DC | PRN
Start: 2017-09-11 — End: 2017-09-16

## 2017-09-11 MED ORDER — TIOTROPIUM BROMIDE 18 MCG IN CPDV
1 | Freq: Every day | RESPIRATORY_TRACT | 0 refills | Status: DC
Start: 2017-09-11 — End: 2017-09-16
  Administered 2017-09-11: 11:00:00 1 via RESPIRATORY_TRACT

## 2017-09-11 MED ORDER — VANCOMYCIN PHARMACY TO MANAGE
1 | 0 refills | Status: DC
Start: 2017-09-11 — End: 2017-09-13

## 2017-09-11 MED ORDER — MAGNESIUM OXIDE 400 MG (241.3 MG MAGNESIUM) PO TAB
400 mg | Freq: Two times a day (BID) | ORAL | 0 refills | Status: DC
Start: 2017-09-11 — End: 2017-09-16
  Administered 2017-09-12 – 2017-09-16 (×9): 400 mg via ORAL

## 2017-09-11 MED ORDER — ALBUTEROL SULFATE 90 MCG/ACTUATION IN HFAA
2 | RESPIRATORY_TRACT | 0 refills | Status: DC | PRN
Start: 2017-09-11 — End: 2017-09-16

## 2017-09-11 MED ORDER — LACTATED RINGERS IV SOLP
INTRAVENOUS | 0 refills | Status: DC
Start: 2017-09-11 — End: 2017-09-11
  Administered 2017-09-11: 06:00:00 1000.000 mL via INTRAVENOUS

## 2017-09-11 MED ORDER — ALBUTEROL SULFATE 2.5 MG /3 ML (0.083 %) IN NEBU
2.5 mg | Freq: Two times a day (BID) | RESPIRATORY_TRACT | 0 refills | Status: DC | PRN
Start: 2017-09-11 — End: 2017-09-16
  Administered 2017-09-11 – 2017-09-16 (×10): 2.5 mg via RESPIRATORY_TRACT

## 2017-09-11 MED ORDER — VANCOMYCIN 2,000 MG IVPB
15 mg/kg | INTRAVENOUS | 0 refills | Status: DC
Start: 2017-09-11 — End: 2017-09-11

## 2017-09-11 MED ORDER — VANCOMYCIN RANDOM DOSING
1 | INTRAVENOUS | 0 refills | Status: DC
Start: 2017-09-11 — End: 2017-09-12

## 2017-09-11 MED ORDER — PANTOPRAZOLE 20 MG PO TBEC
40 mg | Freq: Every day | ORAL | 0 refills | Status: DC
Start: 2017-09-11 — End: 2017-09-16
  Administered 2017-09-11 – 2017-09-16 (×6): 40 mg via ORAL

## 2017-09-11 MED ORDER — ENOXAPARIN 40 MG/0.4 ML SC SYRG
40 mg | Freq: Two times a day (BID) | SUBCUTANEOUS | 0 refills | Status: DC
Start: 2017-09-11 — End: 2017-09-11

## 2017-09-11 MED ORDER — ACETAMINOPHEN 325 MG PO TAB
650 mg | ORAL | 0 refills | Status: DC | PRN
Start: 2017-09-11 — End: 2017-09-16
  Administered 2017-09-11 – 2017-09-15 (×8): 650 mg via ORAL

## 2017-09-11 MED ORDER — PIPERACILLIN/TAZOBACTAM 3.375 G/NS IVPB (MB+)
3.375 g | INTRAVENOUS | 0 refills | Status: DC
Start: 2017-09-11 — End: 2017-09-15
  Administered 2017-09-11 – 2017-09-15 (×34): 3.375 g via INTRAVENOUS

## 2017-09-11 MED ORDER — VANCOMYCIN PHARMACY TO MANAGE
1 | 0 refills | Status: DC
Start: 2017-09-11 — End: 2017-09-11

## 2017-09-12 ENCOUNTER — Encounter: Admit: 2017-09-12 | Discharge: 2017-09-12 | Payer: MEDICARE

## 2017-09-12 DIAGNOSIS — K651 Peritoneal abscess: Principal | ICD-10-CM

## 2017-09-12 LAB — BASIC METABOLIC PANEL: Lab: 136 MMOL/L — ABNORMAL LOW (ref >60)

## 2017-09-12 LAB — VANCOMYCIN TIMED LEVEL: Lab: 10 ug/mL — ABNORMAL LOW (ref >60)

## 2017-09-12 LAB — VRE SCREEN

## 2017-09-12 LAB — MRSA SCREEN

## 2017-09-12 LAB — CBC
Lab: 11 K/UL — ABNORMAL HIGH (ref >60)
Lab: 3.1 M/UL — ABNORMAL LOW (ref 4.0–5.0)
Lab: 8.3 10*3/uL — ABNORMAL LOW (ref 4.5–11.0)
Lab: 9.2 g/dL — ABNORMAL LOW (ref 12.0–15.0)

## 2017-09-12 LAB — PROTIME INR (PT)
Lab: 3.8 mg/dL — ABNORMAL HIGH (ref 0.8–1.2)
Lab: 2.7 % — ABNORMAL HIGH (ref 0.8–1.2)
Lab: 2.5 — ABNORMAL HIGH (ref 0.8–1.2)

## 2017-09-12 LAB — PHOSPHORUS: Lab: 4.1 mg/dL — ABNORMAL HIGH (ref 2.0–4.5)

## 2017-09-12 LAB — MAGNESIUM: Lab: 1.9 mg/dL — ABNORMAL HIGH (ref >60)

## 2017-09-12 MED ORDER — VANCOMYCIN 1,750 MG IVPB
1750 mg | INTRAVENOUS | 0 refills | Status: DC
Start: 2017-09-12 — End: 2017-09-13
  Administered 2017-09-13 (×2): 1750 mg via INTRAVENOUS

## 2017-09-12 MED ORDER — WARFARIN 1 MG PO TAB
1 mg | Freq: Every evening | ORAL | 0 refills | Status: DC
Start: 2017-09-12 — End: 2017-09-13
  Administered 2017-09-13: 01:00:00 1 mg via ORAL

## 2017-09-12 MED ORDER — FENTANYL CITRATE (PF) 50 MCG/ML IJ SOLN
25-50 ug | Freq: Once | INTRAVENOUS | 0 refills | Status: CP
Start: 2017-09-12 — End: ?
  Administered 2017-09-12: 20:00:00 50 ug via INTRAVENOUS

## 2017-09-12 MED ORDER — MIDAZOLAM 1 MG/ML IJ SOLN
1-2 mg | Freq: Once | INTRAVENOUS | 0 refills | Status: CP
Start: 2017-09-12 — End: ?
  Administered 2017-09-12: 20:00:00 1 mg via INTRAVENOUS

## 2017-09-12 MED ORDER — LACTATED RINGERS IV SOLP
INTRAVENOUS | 0 refills | Status: DC
Start: 2017-09-12 — End: 2017-09-12
  Administered 2017-09-12: 11:00:00 1000.000 mL via INTRAVENOUS

## 2017-09-12 MED ORDER — FENTANYL CITRATE (PF) 50 MCG/ML IJ SOLN
0 refills | Status: CP
Start: 2017-09-12 — End: ?
  Administered 2017-09-12: 20:00:00 50 ug via INTRAVENOUS

## 2017-09-12 MED ORDER — MIDAZOLAM 1 MG/ML IJ SOLN
0 refills | Status: CP
Start: 2017-09-12 — End: ?
  Administered 2017-09-12 (×2): 1 mg via INTRAVENOUS

## 2017-09-12 MED ORDER — WARFARIN PHARMACY TO MANAGE
1 | 0 refills | Status: DC
Start: 2017-09-12 — End: 2017-09-16

## 2017-09-13 ENCOUNTER — Inpatient Hospital Stay: Admit: 2017-09-13 | Discharge: 2017-09-13 | Payer: MEDICARE

## 2017-09-13 ENCOUNTER — Encounter: Admit: 2017-09-13 | Discharge: 2017-09-13 | Payer: MEDICARE

## 2017-09-13 LAB — BASIC METABOLIC PANEL: Lab: 139 MMOL/L — ABNORMAL LOW (ref 137–147)

## 2017-09-13 LAB — PHOSPHORUS: Lab: 3.5 mg/dL — ABNORMAL HIGH (ref >60)

## 2017-09-13 LAB — CBC: Lab: 6.3 10*3/uL — ABNORMAL LOW (ref >60)

## 2017-09-13 LAB — MAGNESIUM: Lab: 2.2 mg/dL — ABNORMAL HIGH (ref >60)

## 2017-09-13 LAB — GRAM STAIN

## 2017-09-13 LAB — PROTIME INR (PT): Lab: 2.9 MMOL/L — ABNORMAL HIGH (ref 0.8–1.2)

## 2017-09-13 MED ORDER — SODIUM CHLORIDE 0.9 % IJ SOLN
50 mL | Freq: Once | INTRAVENOUS | 0 refills | Status: CP
Start: 2017-09-13 — End: ?
  Administered 2017-09-14: 50 mL via INTRAVENOUS

## 2017-09-13 MED ORDER — POLYETHYLENE GLYCOL 3350 17 GRAM PO PWPK
1 | Freq: Every day | ORAL | 0 refills | Status: DC
Start: 2017-09-13 — End: 2017-09-16
  Administered 2017-09-14 – 2017-09-15 (×3): 17 g via ORAL

## 2017-09-13 MED ORDER — SENNOSIDES 8.6 MG PO TAB
1 | Freq: Two times a day (BID) | ORAL | 0 refills | Status: DC
Start: 2017-09-13 — End: 2017-09-16
  Administered 2017-09-14 – 2017-09-16 (×5): 1 via ORAL

## 2017-09-13 MED ORDER — WARFARIN 1 MG PO TAB
1 mg | ORAL | 0 refills | Status: DC
Start: 2017-09-13 — End: 2017-09-15
  Administered 2017-09-15: 03:00:00 1 mg via ORAL

## 2017-09-13 MED ORDER — WARFARIN 4 MG PO TAB
2 mg | ORAL | 0 refills | Status: DC
Start: 2017-09-13 — End: 2017-09-15
  Administered 2017-09-14: 05:00:00 2 mg via ORAL

## 2017-09-13 MED ORDER — OXYCODONE 5 MG PO TAB
5-10 mg | ORAL | 0 refills | Status: DC | PRN
Start: 2017-09-13 — End: 2017-09-16
  Administered 2017-09-13 – 2017-09-15 (×3): 5 mg via ORAL
  Administered 2017-09-15: 13:00:00 10 mg via ORAL

## 2017-09-13 MED ORDER — IOHEXOL 350 MG IODINE/ML IV SOLN
80 mL | Freq: Once | INTRAVENOUS | 0 refills | Status: CP
Start: 2017-09-13 — End: ?
  Administered 2017-09-14: 80 mL via INTRAVENOUS

## 2017-09-13 MED ADMIN — WATER FOR IRRIGATION, STERILE IR SOLN [7485]: @ 22:00:00 | Stop: 2017-09-13 | NDC 00338000403

## 2017-09-14 LAB — PROTIME INR (PT): Lab: 3.4 mg/dL — ABNORMAL HIGH (ref >60)

## 2017-09-14 LAB — BASIC METABOLIC PANEL: Lab: 141 MMOL/L — ABNORMAL LOW (ref 137–147)

## 2017-09-14 LAB — MAGNESIUM: Lab: 2.2 mg/dL — ABNORMAL LOW (ref >60)

## 2017-09-14 LAB — PHOSPHORUS: Lab: 3.1 mg/dL — ABNORMAL HIGH (ref 2.0–4.5)

## 2017-09-14 LAB — CBC: Lab: 6.4 K/UL — ABNORMAL LOW (ref 4.5–11.0)

## 2017-09-14 MED ORDER — BUDESONIDE 0.25 MG/2 ML IN NBSP
0.25 mg | Freq: Two times a day (BID) | RESPIRATORY_TRACT | 0 refills | Status: DC
Start: 2017-09-14 — End: 2017-09-16
  Administered 2017-09-15 – 2017-09-16 (×2): 0.25 mg via RESPIRATORY_TRACT

## 2017-09-15 ENCOUNTER — Encounter: Admit: 2017-09-15 | Discharge: 2017-09-15 | Payer: MEDICARE

## 2017-09-15 DIAGNOSIS — L02211 Cutaneous abscess of abdominal wall: Principal | ICD-10-CM

## 2017-09-15 LAB — BASIC METABOLIC PANEL: Lab: 141 MMOL/L — ABNORMAL LOW (ref 137–147)

## 2017-09-15 LAB — CULTURE-WOUND/TISSUE/FLUID(AEROBIC ONLY)W/SENSITIVITY

## 2017-09-15 LAB — PHOSPHORUS: Lab: 2.6 mg/dL — ABNORMAL HIGH (ref 2.0–4.5)

## 2017-09-15 LAB — MAGNESIUM: Lab: 2 mg/dL — ABNORMAL LOW (ref 1.6–2.6)

## 2017-09-15 LAB — PROTIME INR (PT): Lab: 3.5 g/dL — ABNORMAL HIGH (ref 0.8–1.2)

## 2017-09-15 LAB — CBC: Lab: 9.4 10*3/uL (ref 4.5–11.0)

## 2017-09-15 MED ORDER — AMOXICILLIN-POT CLAVULANATE 875-125 MG PO TAB
875 mg | Freq: Two times a day (BID) | ORAL | 0 refills | Status: CN
Start: 2017-09-15 — End: ?

## 2017-09-15 MED ORDER — WARFARIN 1 MG PO TAB
ORAL_TABLET | Freq: Every day | ORAL | 3 refills | 90.00000 days | Status: AC
Start: 2017-09-15 — End: ?

## 2017-09-15 MED ORDER — WARFARIN 4 MG PO TAB
2 mg | ORAL | 0 refills | Status: DC
Start: 2017-09-15 — End: 2017-09-16

## 2017-09-15 MED ORDER — AMOXICILLIN-POT CLAVULANATE 875-125 MG PO TAB
875 mg | Freq: Two times a day (BID) | ORAL | 0 refills | Status: DC
Start: 2017-09-15 — End: 2017-09-16
  Administered 2017-09-15 – 2017-09-16 (×3): 875 mg via ORAL

## 2017-09-15 MED ORDER — OXYCODONE 5 MG PO TAB
5 mg | ORAL_TABLET | ORAL | 0 refills | Status: SS | PRN
Start: 2017-09-15 — End: 2017-11-10

## 2017-09-15 MED ORDER — LEVOFLOXACIN 750 MG PO TAB
750 mg | ORAL_TABLET | ORAL | 0 refills | Status: SS
Start: 2017-09-15 — End: 2017-10-03

## 2017-09-15 MED ORDER — AMOXICILLIN-POT CLAVULANATE 875-125 MG PO TAB
875 mg | ORAL_TABLET | Freq: Two times a day (BID) | ORAL | 0 refills | 7.00000 days | Status: AC
Start: 2017-09-15 — End: ?

## 2017-09-15 MED ORDER — LEVOFLOXACIN 750 MG PO TAB
750 mg | ORAL | 0 refills | Status: DC
Start: 2017-09-15 — End: 2017-09-16
  Administered 2017-09-15: 19:00:00 750 mg via ORAL

## 2017-09-15 MED ORDER — WARFARIN 1 MG PO TAB
1 mg | ORAL | 0 refills | Status: DC
Start: 2017-09-15 — End: 2017-09-16
  Administered 2017-09-16: 01:00:00 1 mg via ORAL

## 2017-09-16 ENCOUNTER — Ambulatory Visit: Admit: 2017-09-12 | Discharge: 2017-09-12 | Payer: MEDICARE

## 2017-09-16 DIAGNOSIS — Z96659 Presence of unspecified artificial knee joint: ICD-10-CM

## 2017-09-16 DIAGNOSIS — Z87891 Personal history of nicotine dependence: ICD-10-CM

## 2017-09-16 DIAGNOSIS — K219 Gastro-esophageal reflux disease without esophagitis: ICD-10-CM

## 2017-09-16 DIAGNOSIS — I350 Nonrheumatic aortic (valve) stenosis: ICD-10-CM

## 2017-09-16 DIAGNOSIS — Z982 Presence of cerebrospinal fluid drainage device: ICD-10-CM

## 2017-09-16 DIAGNOSIS — J449 Chronic obstructive pulmonary disease, unspecified: ICD-10-CM

## 2017-09-16 DIAGNOSIS — R791 Abnormal coagulation profile: ICD-10-CM

## 2017-09-16 DIAGNOSIS — D62 Acute posthemorrhagic anemia: ICD-10-CM

## 2017-09-16 DIAGNOSIS — Z9981 Dependence on supplemental oxygen: ICD-10-CM

## 2017-09-16 DIAGNOSIS — Z952 Presence of prosthetic heart valve: ICD-10-CM

## 2017-09-16 DIAGNOSIS — Z90710 Acquired absence of both cervix and uterus: ICD-10-CM

## 2017-09-16 DIAGNOSIS — I10 Essential (primary) hypertension: ICD-10-CM

## 2017-09-16 DIAGNOSIS — Z6841 Body Mass Index (BMI) 40.0 and over, adult: ICD-10-CM

## 2017-09-16 DIAGNOSIS — T45515A Adverse effect of anticoagulants, initial encounter: ICD-10-CM

## 2017-09-16 DIAGNOSIS — K651 Peritoneal abscess: Principal | ICD-10-CM

## 2017-09-16 LAB — PROTIME INR (PT): Lab: 3.7 M/UL — ABNORMAL HIGH (ref >60)

## 2017-09-16 MED ORDER — VITS A AND D-WHITE PET-LANOLIN TP OINT
TOPICAL | 0 refills | Status: DC | PRN
Start: 2017-09-16 — End: 2017-09-16

## 2017-09-16 MED ORDER — VITS A AND D-WHITE PET-LANOLIN TP OINT
TOPICAL | 1 refills | Status: SS | PRN
Start: 2017-09-16 — End: 2018-05-12

## 2017-09-21 ENCOUNTER — Ambulatory Visit: Admit: 2017-09-21 | Discharge: 2017-09-21 | Payer: MEDICARE

## 2017-09-21 ENCOUNTER — Encounter: Admit: 2017-09-21 | Discharge: 2017-09-21 | Payer: MEDICARE

## 2017-09-21 ENCOUNTER — Ambulatory Visit: Admit: 2017-09-21 | Discharge: 2017-09-22 | Payer: MEDICARE

## 2017-09-21 DIAGNOSIS — I10 Essential (primary) hypertension: Principal | ICD-10-CM

## 2017-09-21 DIAGNOSIS — L02211 Cutaneous abscess of abdominal wall: Principal | ICD-10-CM

## 2017-09-21 DIAGNOSIS — K219 Gastro-esophageal reflux disease without esophagitis: ICD-10-CM

## 2017-09-21 DIAGNOSIS — Z8679 Personal history of other diseases of the circulatory system: ICD-10-CM

## 2017-09-21 DIAGNOSIS — Z9981 Dependence on supplemental oxygen: ICD-10-CM

## 2017-09-21 DIAGNOSIS — I4892 Unspecified atrial flutter: ICD-10-CM

## 2017-09-21 DIAGNOSIS — R569 Unspecified convulsions: ICD-10-CM

## 2017-09-21 DIAGNOSIS — I350 Nonrheumatic aortic (valve) stenosis: ICD-10-CM

## 2017-09-21 DIAGNOSIS — R9389 Abnormal findings on diagnostic imaging of other specified body structures: ICD-10-CM

## 2017-09-21 DIAGNOSIS — I607 Nontraumatic subarachnoid hemorrhage from unspecified intracranial artery: ICD-10-CM

## 2017-09-21 DIAGNOSIS — R0600 Dyspnea, unspecified: ICD-10-CM

## 2017-09-21 DIAGNOSIS — J449 Chronic obstructive pulmonary disease, unspecified: ICD-10-CM

## 2017-09-21 MED ORDER — SODIUM CHLORIDE 0.9 % IJ SOLN
50 mL | Freq: Once | INTRAVENOUS | 0 refills | Status: CP
Start: 2017-09-21 — End: ?
  Administered 2017-09-21: 16:00:00 50 mL via INTRAVENOUS

## 2017-09-21 MED ORDER — IOHEXOL 350 MG IODINE/ML IV SOLN
80 mL | Freq: Once | INTRAVENOUS | 0 refills | Status: CP
Start: 2017-09-21 — End: ?
  Administered 2017-09-21: 16:00:00 80 mL via INTRAVENOUS

## 2017-09-22 ENCOUNTER — Encounter: Admit: 2017-09-22 | Discharge: 2017-09-22 | Payer: MEDICARE

## 2017-09-22 ENCOUNTER — Ambulatory Visit: Admit: 2017-09-22 | Discharge: 2017-09-22 | Payer: MEDICARE

## 2017-09-22 DIAGNOSIS — Z882 Allergy status to sulfonamides status: ICD-10-CM

## 2017-09-22 DIAGNOSIS — Z881 Allergy status to other antibiotic agents status: ICD-10-CM

## 2017-09-22 DIAGNOSIS — Z4682 Encounter for fitting and adjustment of non-vascular catheter: Principal | ICD-10-CM

## 2017-09-22 DIAGNOSIS — L02211 Cutaneous abscess of abdominal wall: Principal | ICD-10-CM

## 2017-09-22 MED ORDER — FENTANYL CITRATE (PF) 50 MCG/ML IJ SOLN
25-50 ug | Freq: Once | INTRAVENOUS | 0 refills | Status: CP
Start: 2017-09-22 — End: ?
  Administered 2017-09-22: 22:00:00 50 ug via INTRAVENOUS

## 2017-09-22 MED ORDER — MIDAZOLAM 1 MG/ML IJ SOLN
0 refills | Status: CP
Start: 2017-09-22 — End: ?
  Administered 2017-09-22: 22:00:00 1 mg via INTRAVENOUS

## 2017-09-22 MED ORDER — FENTANYL CITRATE (PF) 50 MCG/ML IJ SOLN
0 refills | Status: CP
Start: 2017-09-22 — End: ?
  Administered 2017-09-22: 23:00:00 25 ug via INTRAVENOUS

## 2017-09-22 MED ORDER — FENTANYL CITRATE (PF) 50 MCG/ML IJ SOLN
0 refills | Status: CP
Start: 2017-09-22 — End: ?
  Administered 2017-09-22: 22:00:00 25 ug via INTRAVENOUS

## 2017-09-22 MED ORDER — MIDAZOLAM 1 MG/ML IJ SOLN
1 mg | Freq: Once | INTRAVENOUS | 0 refills | Status: CP
Start: 2017-09-22 — End: ?
  Administered 2017-09-22: 22:00:00 1 mg via INTRAVENOUS

## 2017-09-28 ENCOUNTER — Encounter: Admit: 2017-09-28 | Discharge: 2017-09-28 | Payer: MEDICARE

## 2017-09-28 DIAGNOSIS — I607 Nontraumatic subarachnoid hemorrhage from unspecified intracranial artery: ICD-10-CM

## 2017-09-28 DIAGNOSIS — K219 Gastro-esophageal reflux disease without esophagitis: ICD-10-CM

## 2017-09-28 DIAGNOSIS — R0600 Dyspnea, unspecified: ICD-10-CM

## 2017-09-28 DIAGNOSIS — J449 Chronic obstructive pulmonary disease, unspecified: ICD-10-CM

## 2017-09-28 DIAGNOSIS — I10 Essential (primary) hypertension: Principal | ICD-10-CM

## 2017-09-28 DIAGNOSIS — Z8679 Personal history of other diseases of the circulatory system: ICD-10-CM

## 2017-09-28 DIAGNOSIS — I4892 Unspecified atrial flutter: ICD-10-CM

## 2017-09-28 DIAGNOSIS — R569 Unspecified convulsions: ICD-10-CM

## 2017-09-28 DIAGNOSIS — R9389 Abnormal findings on diagnostic imaging of other specified body structures: ICD-10-CM

## 2017-09-28 DIAGNOSIS — Z9981 Dependence on supplemental oxygen: ICD-10-CM

## 2017-09-28 DIAGNOSIS — I350 Nonrheumatic aortic (valve) stenosis: ICD-10-CM

## 2017-10-01 ENCOUNTER — Emergency Department
Admit: 2017-10-01 | Discharge: 2017-10-01 | Payer: MEDICARE | Attending: Student in an Organized Health Care Education/Training Program

## 2017-10-01 ENCOUNTER — Encounter: Admit: 2017-10-01 | Discharge: 2017-10-01 | Payer: MEDICARE

## 2017-10-01 ENCOUNTER — Emergency Department
Admit: 2017-10-01 | Discharge: 2017-10-01 | Disposition: A | Discharge status: Home / Self Care | Payer: MEDICARE | Attending: Student in an Organized Health Care Education/Training Program

## 2017-10-01 DIAGNOSIS — J449 Chronic obstructive pulmonary disease, unspecified: ICD-10-CM

## 2017-10-01 DIAGNOSIS — Z8679 Personal history of other diseases of the circulatory system: ICD-10-CM

## 2017-10-01 DIAGNOSIS — Z4889 Encounter for other specified surgical aftercare: Principal | ICD-10-CM

## 2017-10-01 DIAGNOSIS — I4892 Unspecified atrial flutter: ICD-10-CM

## 2017-10-01 DIAGNOSIS — I482 Chronic atrial fibrillation: ICD-10-CM

## 2017-10-01 DIAGNOSIS — I10 Essential (primary) hypertension: Principal | ICD-10-CM

## 2017-10-01 DIAGNOSIS — L02211 Cutaneous abscess of abdominal wall: ICD-10-CM

## 2017-10-01 DIAGNOSIS — R569 Unspecified convulsions: ICD-10-CM

## 2017-10-01 DIAGNOSIS — I607 Nontraumatic subarachnoid hemorrhage from unspecified intracranial artery: ICD-10-CM

## 2017-10-01 DIAGNOSIS — I350 Nonrheumatic aortic (valve) stenosis: ICD-10-CM

## 2017-10-01 DIAGNOSIS — R0600 Dyspnea, unspecified: ICD-10-CM

## 2017-10-01 DIAGNOSIS — K219 Gastro-esophageal reflux disease without esophagitis: ICD-10-CM

## 2017-10-01 DIAGNOSIS — Z9981 Dependence on supplemental oxygen: ICD-10-CM

## 2017-10-01 DIAGNOSIS — R9389 Abnormal findings on diagnostic imaging of other specified body structures: ICD-10-CM

## 2017-10-01 LAB — COMPREHENSIVE METABOLIC PANEL
Lab: 0.6 mg/dL (ref 0.3–1.2)
Lab: 1.2 mg/dL — ABNORMAL HIGH (ref 0.4–1.00)
Lab: 103 MMOL/L — ABNORMAL LOW (ref 98–110)
Lab: 14 U/L (ref 7–40)
Lab: 140 MMOL/L — ABNORMAL LOW (ref 137–147)
Lab: 16 mg/dL (ref 7–25)
Lab: 3.6 g/dL — ABNORMAL HIGH (ref 3.5–5.0)
Lab: 31 MMOL/L — ABNORMAL HIGH (ref 21–30)
Lab: 4.2 MMOL/L — ABNORMAL LOW (ref 3.5–5.1)
Lab: 42 mL/min — ABNORMAL LOW (ref >60)
Lab: 51 mL/min — ABNORMAL LOW (ref >60)
Lab: 6 10*3/uL — ABNORMAL HIGH (ref 3–12)
Lab: 6.9 g/dL (ref 6.0–8.0)
Lab: 7 U/L (ref 7–56)
Lab: 84 U/L — ABNORMAL LOW (ref 25–110)
Lab: 9.9 mg/dL — ABNORMAL HIGH (ref 8.5–10.6)
Lab: 98 mg/dL (ref 70–100)

## 2017-10-01 LAB — CBC AND DIFF
Lab: 0 10*3/uL (ref 0–0.20)
Lab: 0.2 10*3/uL (ref 0–0.45)
Lab: 10 10*3/uL (ref 4.5–11.0)

## 2017-10-02 ENCOUNTER — Encounter: Admit: 2017-10-02 | Discharge: 2017-10-02 | Payer: MEDICARE

## 2017-10-02 DIAGNOSIS — L02211 Cutaneous abscess of abdominal wall: Principal | ICD-10-CM

## 2017-10-03 ENCOUNTER — Encounter: Admit: 2017-10-03 | Discharge: 2017-10-03 | Payer: MEDICARE

## 2017-10-03 ENCOUNTER — Ambulatory Visit: Admit: 2017-10-03 | Discharge: 2017-10-03 | Payer: MEDICARE

## 2017-10-03 DIAGNOSIS — Z882 Allergy status to sulfonamides status: ICD-10-CM

## 2017-10-03 DIAGNOSIS — Z881 Allergy status to other antibiotic agents status: ICD-10-CM

## 2017-10-03 DIAGNOSIS — L02211 Cutaneous abscess of abdominal wall: ICD-10-CM

## 2017-10-03 DIAGNOSIS — R188 Other ascites: ICD-10-CM

## 2017-10-03 DIAGNOSIS — Z4682 Encounter for fitting and adjustment of non-vascular catheter: Principal | ICD-10-CM

## 2017-10-03 MED ORDER — MIDAZOLAM 1 MG/ML IJ SOLN
0 refills | Status: CP
  Administered 2017-10-03: 17:00:00 1 mg via INTRAVENOUS

## 2017-10-03 MED ORDER — MIDAZOLAM 1 MG/ML IJ SOLN
1 mg | Freq: Once | INTRAVENOUS | 0 refills | Status: CP
Start: 2017-10-03 — End: ?
  Administered 2017-10-03: 17:00:00 1 mg via INTRAVENOUS

## 2017-10-03 MED ORDER — FENTANYL CITRATE (PF) 50 MCG/ML IJ SOLN: 0 refills | Status: CP

## 2017-10-03 MED ORDER — FENTANYL CITRATE (PF) 50 MCG/ML IJ SOLN
25-50 ug | Freq: Once | INTRAVENOUS | 0 refills | Status: CP
Start: 2017-10-03 — End: ?
  Administered 2017-10-03: 17:00:00 50 ug via INTRAVENOUS

## 2017-10-04 ENCOUNTER — Encounter: Admit: 2017-10-04 | Discharge: 2017-10-04 | Payer: MEDICARE

## 2017-10-04 DIAGNOSIS — L02211 Cutaneous abscess of abdominal wall: Principal | ICD-10-CM

## 2017-10-04 DIAGNOSIS — K5780 Diverticulitis of intestine, part unspecified, with perforation and abscess without bleeding: ICD-10-CM

## 2017-10-05 ENCOUNTER — Ambulatory Visit: Admit: 2017-10-05 | Discharge: 2017-10-05 | Payer: MEDICARE

## 2017-10-05 DIAGNOSIS — K632 Fistula of intestine: Principal | ICD-10-CM

## 2017-10-05 DIAGNOSIS — L02211 Cutaneous abscess of abdominal wall: ICD-10-CM

## 2017-10-05 DIAGNOSIS — Z882 Allergy status to sulfonamides status: ICD-10-CM

## 2017-10-05 DIAGNOSIS — Z881 Allergy status to other antibiotic agents status: ICD-10-CM

## 2017-10-05 DIAGNOSIS — K5780 Diverticulitis of intestine, part unspecified, with perforation and abscess without bleeding: Secondary | ICD-10-CM

## 2017-10-05 MED ORDER — FENTANYL CITRATE (PF) 50 MCG/ML IJ SOLN
0 refills | Status: CP
Start: 2017-10-05 — End: ?
  Administered 2017-10-05: 17:00:00 50 ug via INTRAVENOUS

## 2017-10-05 MED ORDER — FENTANYL CITRATE (PF) 50 MCG/ML IJ SOLN
25-50 ug | Freq: Once | INTRAVENOUS | 0 refills | Status: CP
Start: 2017-10-05 — End: ?
  Administered 2017-10-05: 16:00:00 50 ug via INTRAVENOUS

## 2017-10-05 MED ORDER — MIDAZOLAM 1 MG/ML IJ SOLN
1-2 mg | Freq: Once | INTRAVENOUS | 0 refills | Status: CP
Start: 2017-10-05 — End: ?
  Administered 2017-10-05: 16:00:00 1 mg via INTRAVENOUS

## 2017-10-05 MED ORDER — IOPAMIDOL 61 % IV SOLN
20 mL | Freq: Once | 0 refills | Status: CP
Start: 2017-10-05 — End: ?
  Administered 2017-10-05: 17:00:00 20 mL

## 2017-10-06 ENCOUNTER — Encounter: Admit: 2017-10-06 | Discharge: 2017-10-06 | Payer: MEDICARE

## 2017-10-06 DIAGNOSIS — Z8679 Personal history of other diseases of the circulatory system: ICD-10-CM

## 2017-10-06 DIAGNOSIS — I350 Nonrheumatic aortic (valve) stenosis: ICD-10-CM

## 2017-10-06 DIAGNOSIS — R9389 Abnormal findings on diagnostic imaging of other specified body structures: ICD-10-CM

## 2017-10-06 DIAGNOSIS — I4892 Unspecified atrial flutter: Principal | ICD-10-CM

## 2017-10-06 DIAGNOSIS — K219 Gastro-esophageal reflux disease without esophagitis: ICD-10-CM

## 2017-10-06 DIAGNOSIS — Z79899 Other long term (current) drug therapy: ICD-10-CM

## 2017-10-06 DIAGNOSIS — R0600 Dyspnea, unspecified: ICD-10-CM

## 2017-10-06 DIAGNOSIS — J449 Chronic obstructive pulmonary disease, unspecified: ICD-10-CM

## 2017-10-06 DIAGNOSIS — I607 Nontraumatic subarachnoid hemorrhage from unspecified intracranial artery: ICD-10-CM

## 2017-10-06 DIAGNOSIS — R569 Unspecified convulsions: ICD-10-CM

## 2017-10-06 DIAGNOSIS — Z9981 Dependence on supplemental oxygen: ICD-10-CM

## 2017-10-06 DIAGNOSIS — I10 Essential (primary) hypertension: Principal | ICD-10-CM

## 2017-10-11 ENCOUNTER — Encounter: Admit: 2017-10-11 | Discharge: 2017-10-11 | Payer: MEDICARE

## 2017-10-11 DIAGNOSIS — L02211 Cutaneous abscess of abdominal wall: Principal | ICD-10-CM

## 2017-10-12 ENCOUNTER — Ambulatory Visit: Admit: 2017-10-12 | Discharge: 2017-10-12 | Payer: MEDICARE

## 2017-10-12 DIAGNOSIS — L02211 Cutaneous abscess of abdominal wall: ICD-10-CM

## 2017-10-12 DIAGNOSIS — Z881 Allergy status to other antibiotic agents status: ICD-10-CM

## 2017-10-12 DIAGNOSIS — Z4803 Encounter for change or removal of drains: Principal | ICD-10-CM

## 2017-10-12 DIAGNOSIS — Z882 Allergy status to sulfonamides status: ICD-10-CM

## 2017-10-12 MED ORDER — FENTANYL CITRATE (PF) 50 MCG/ML IJ SOLN
0 refills | Status: CP
Start: 2017-10-12 — End: ?
  Administered 2017-10-12: 19:00:00 25 ug via INTRAVENOUS

## 2017-10-12 MED ORDER — IOPAMIDOL 61 % IV SOLN
10 mL | Freq: Once | 0 refills | Status: CP
Start: 2017-10-12 — End: ?
  Administered 2017-10-12: 19:00:00 10 mL

## 2017-10-12 MED ORDER — FENTANYL CITRATE (PF) 50 MCG/ML IJ SOLN
50 ug | Freq: Once | INTRAVENOUS | 0 refills | Status: CP
Start: 2017-10-12 — End: ?
  Administered 2017-10-12: 19:00:00 50 ug via INTRAVENOUS

## 2017-10-12 MED ORDER — MIDAZOLAM 1 MG/ML IJ SOLN
1 mg | Freq: Once | INTRAVENOUS | 0 refills | Status: CP
Start: 2017-10-12 — End: ?
  Administered 2017-10-12: 19:00:00 1 mg via INTRAVENOUS

## 2017-10-15 ENCOUNTER — Encounter: Admit: 2017-10-15 | Discharge: 2017-10-15 | Payer: MEDICARE

## 2017-10-19 ENCOUNTER — Ambulatory Visit: Admit: 2017-10-19 | Discharge: 2017-10-19 | Payer: MEDICARE

## 2017-10-19 DIAGNOSIS — L02211 Cutaneous abscess of abdominal wall: Principal | ICD-10-CM

## 2017-10-19 MED ORDER — DIATRIZOATE MEG-DIATRIZOAT SOD 66-10 % PO SOLN
100 mL | Freq: Once | ORAL | 0 refills | Status: CP
Start: 2017-10-19 — End: ?
  Administered 2017-10-19: 16:00:00 100 mL via ORAL

## 2017-10-24 ENCOUNTER — Encounter: Admit: 2017-10-24 | Discharge: 2017-10-24 | Payer: MEDICARE

## 2017-10-24 ENCOUNTER — Ambulatory Visit: Admit: 2017-10-24 | Discharge: 2017-10-25 | Payer: MEDICARE

## 2017-10-24 DIAGNOSIS — Z8679 Personal history of other diseases of the circulatory system: ICD-10-CM

## 2017-10-24 DIAGNOSIS — K219 Gastro-esophageal reflux disease without esophagitis: ICD-10-CM

## 2017-10-24 DIAGNOSIS — J449 Chronic obstructive pulmonary disease, unspecified: ICD-10-CM

## 2017-10-24 DIAGNOSIS — I4892 Unspecified atrial flutter: ICD-10-CM

## 2017-10-24 DIAGNOSIS — R0600 Dyspnea, unspecified: ICD-10-CM

## 2017-10-24 DIAGNOSIS — I607 Nontraumatic subarachnoid hemorrhage from unspecified intracranial artery: ICD-10-CM

## 2017-10-24 DIAGNOSIS — R569 Unspecified convulsions: ICD-10-CM

## 2017-10-24 DIAGNOSIS — Z9981 Dependence on supplemental oxygen: ICD-10-CM

## 2017-10-24 DIAGNOSIS — R9389 Abnormal findings on diagnostic imaging of other specified body structures: ICD-10-CM

## 2017-10-24 DIAGNOSIS — I10 Essential (primary) hypertension: Principal | ICD-10-CM

## 2017-10-24 DIAGNOSIS — I350 Nonrheumatic aortic (valve) stenosis: ICD-10-CM

## 2017-10-25 ENCOUNTER — Encounter: Admit: 2017-10-25 | Discharge: 2017-10-25 | Payer: MEDICARE

## 2017-10-25 DIAGNOSIS — E43 Unspecified severe protein-calorie malnutrition: Principal | ICD-10-CM

## 2017-11-07 ENCOUNTER — Encounter: Admit: 2017-11-07 | Discharge: 2017-11-07 | Payer: MEDICARE

## 2017-11-07 ENCOUNTER — Ambulatory Visit: Admit: 2017-11-07 | Discharge: 2017-11-08 | Payer: MEDICARE

## 2017-11-07 ENCOUNTER — Ambulatory Visit: Admit: 2017-11-07 | Discharge: 2017-11-07 | Payer: MEDICARE

## 2017-11-07 DIAGNOSIS — J449 Chronic obstructive pulmonary disease, unspecified: ICD-10-CM

## 2017-11-07 DIAGNOSIS — R569 Unspecified convulsions: ICD-10-CM

## 2017-11-07 DIAGNOSIS — R9389 Abnormal findings on diagnostic imaging of other specified body structures: ICD-10-CM

## 2017-11-07 DIAGNOSIS — I10 Essential (primary) hypertension: Principal | ICD-10-CM

## 2017-11-07 DIAGNOSIS — K632 Fistula of intestine: Secondary | ICD-10-CM

## 2017-11-07 DIAGNOSIS — R0600 Dyspnea, unspecified: ICD-10-CM

## 2017-11-07 DIAGNOSIS — I607 Nontraumatic subarachnoid hemorrhage from unspecified intracranial artery: ICD-10-CM

## 2017-11-07 DIAGNOSIS — Z8679 Personal history of other diseases of the circulatory system: ICD-10-CM

## 2017-11-07 DIAGNOSIS — L02211 Cutaneous abscess of abdominal wall: Principal | ICD-10-CM

## 2017-11-07 DIAGNOSIS — Z9981 Dependence on supplemental oxygen: ICD-10-CM

## 2017-11-07 DIAGNOSIS — I4892 Unspecified atrial flutter: ICD-10-CM

## 2017-11-07 DIAGNOSIS — I350 Nonrheumatic aortic (valve) stenosis: ICD-10-CM

## 2017-11-07 DIAGNOSIS — K219 Gastro-esophageal reflux disease without esophagitis: ICD-10-CM

## 2017-11-08 DIAGNOSIS — L02211 Cutaneous abscess of abdominal wall: Principal | ICD-10-CM

## 2017-11-09 ENCOUNTER — Encounter: Admit: 2017-11-09 | Discharge: 2017-11-09 | Payer: MEDICARE

## 2017-11-09 DIAGNOSIS — L02211 Cutaneous abscess of abdominal wall: Principal | ICD-10-CM

## 2017-11-09 DIAGNOSIS — K5780 Diverticulitis of intestine, part unspecified, with perforation and abscess without bleeding: ICD-10-CM

## 2017-11-10 ENCOUNTER — Ambulatory Visit: Admit: 2017-11-10 | Discharge: 2017-11-10 | Payer: MEDICARE

## 2017-11-10 DIAGNOSIS — K5780 Diverticulitis of intestine, part unspecified, with perforation and abscess without bleeding: ICD-10-CM

## 2017-11-10 DIAGNOSIS — T85898A Other specified complication of other internal prosthetic devices, implants and grafts, initial encounter: Principal | ICD-10-CM

## 2017-11-10 DIAGNOSIS — Z881 Allergy status to other antibiotic agents status: ICD-10-CM

## 2017-11-10 DIAGNOSIS — T85628A Displacement of other specified internal prosthetic devices, implants and grafts, initial encounter: ICD-10-CM

## 2017-11-10 DIAGNOSIS — L02211 Cutaneous abscess of abdominal wall: ICD-10-CM

## 2017-11-10 DIAGNOSIS — Z882 Allergy status to sulfonamides status: ICD-10-CM

## 2017-11-10 MED ORDER — IOPAMIDOL 61 % IV SOLN
20 mL | Freq: Once | 0 refills | Status: CP
Start: 2017-11-10 — End: ?
  Administered 2017-11-10: 18:00:00 20 mL

## 2017-11-10 MED ORDER — FENTANYL CITRATE (PF) 50 MCG/ML IJ SOLN
0 refills | Status: CP
  Administered 2017-11-10 (×2): 25 ug via INTRAVENOUS

## 2017-11-10 MED ORDER — FENTANYL CITRATE (PF) 50 MCG/ML IJ SOLN
25-50 ug | Freq: Once | INTRAVENOUS | 0 refills | Status: CP
Start: 2017-11-10 — End: ?

## 2017-11-10 MED ORDER — MIDAZOLAM 1 MG/ML IJ SOLN
1-2 mg | Freq: Once | INTRAVENOUS | 0 refills | Status: CP
Start: 2017-11-10 — End: ?
  Administered 2017-11-10: 17:00:00 2 mg via INTRAVENOUS

## 2017-11-10 MED ORDER — MIDAZOLAM 1 MG/ML IJ SOLN
0 refills | Status: CP
  Administered 2017-11-10: 18:00:00 0.5 mg via INTRAVENOUS

## 2017-11-21 ENCOUNTER — Ambulatory Visit: Admit: 2017-11-21 | Discharge: 2017-11-22 | Payer: MEDICARE

## 2017-11-21 ENCOUNTER — Encounter: Admit: 2017-11-21 | Discharge: 2017-11-21 | Payer: MEDICARE

## 2017-11-21 DIAGNOSIS — Z9981 Dependence on supplemental oxygen: ICD-10-CM

## 2017-11-21 DIAGNOSIS — J449 Chronic obstructive pulmonary disease, unspecified: ICD-10-CM

## 2017-11-21 DIAGNOSIS — I350 Nonrheumatic aortic (valve) stenosis: ICD-10-CM

## 2017-11-21 DIAGNOSIS — R569 Unspecified convulsions: ICD-10-CM

## 2017-11-21 DIAGNOSIS — R0600 Dyspnea, unspecified: ICD-10-CM

## 2017-11-21 DIAGNOSIS — I607 Nontraumatic subarachnoid hemorrhage from unspecified intracranial artery: ICD-10-CM

## 2017-11-21 DIAGNOSIS — Z8679 Personal history of other diseases of the circulatory system: ICD-10-CM

## 2017-11-21 DIAGNOSIS — R9389 Abnormal findings on diagnostic imaging of other specified body structures: ICD-10-CM

## 2017-11-21 DIAGNOSIS — I4892 Unspecified atrial flutter: ICD-10-CM

## 2017-11-21 DIAGNOSIS — K219 Gastro-esophageal reflux disease without esophagitis: ICD-10-CM

## 2017-11-21 DIAGNOSIS — I10 Essential (primary) hypertension: Principal | ICD-10-CM

## 2017-11-22 DIAGNOSIS — K5780 Diverticulitis of intestine, part unspecified, with perforation and abscess without bleeding: Principal | ICD-10-CM

## 2017-11-28 ENCOUNTER — Encounter: Admit: 2017-11-28 | Discharge: 2017-11-28 | Payer: MEDICARE

## 2017-12-06 ENCOUNTER — Encounter: Admit: 2017-12-06 | Discharge: 2017-12-06 | Payer: MEDICARE

## 2017-12-06 DIAGNOSIS — K5780 Diverticulitis of intestine, part unspecified, with perforation and abscess without bleeding: ICD-10-CM

## 2017-12-06 DIAGNOSIS — Z6841 Body Mass Index (BMI) 40.0 and over, adult: ICD-10-CM

## 2017-12-06 DIAGNOSIS — I4892 Unspecified atrial flutter: ICD-10-CM

## 2017-12-06 DIAGNOSIS — R0600 Dyspnea, unspecified: ICD-10-CM

## 2017-12-06 DIAGNOSIS — K219 Gastro-esophageal reflux disease without esophagitis: ICD-10-CM

## 2017-12-06 DIAGNOSIS — Z952 Presence of prosthetic heart valve: ICD-10-CM

## 2017-12-06 DIAGNOSIS — I110 Hypertensive heart disease with heart failure: ICD-10-CM

## 2017-12-06 DIAGNOSIS — I607 Nontraumatic subarachnoid hemorrhage from unspecified intracranial artery: ICD-10-CM

## 2017-12-06 DIAGNOSIS — K5792 Diverticulitis of intestine, part unspecified, without perforation or abscess without bleeding: Principal | ICD-10-CM

## 2017-12-06 DIAGNOSIS — Z8679 Personal history of other diseases of the circulatory system: ICD-10-CM

## 2017-12-06 DIAGNOSIS — R569 Unspecified convulsions: ICD-10-CM

## 2017-12-06 DIAGNOSIS — J449 Chronic obstructive pulmonary disease, unspecified: ICD-10-CM

## 2017-12-06 DIAGNOSIS — Z982 Presence of cerebrospinal fluid drainage device: ICD-10-CM

## 2017-12-06 DIAGNOSIS — I671 Cerebral aneurysm, nonruptured: ICD-10-CM

## 2017-12-06 DIAGNOSIS — I10 Essential (primary) hypertension: Principal | ICD-10-CM

## 2017-12-06 DIAGNOSIS — I350 Nonrheumatic aortic (valve) stenosis: ICD-10-CM

## 2017-12-06 DIAGNOSIS — Z9981 Dependence on supplemental oxygen: ICD-10-CM

## 2017-12-06 DIAGNOSIS — R9389 Abnormal findings on diagnostic imaging of other specified body structures: ICD-10-CM

## 2017-12-07 ENCOUNTER — Encounter: Admit: 2017-12-07 | Discharge: 2017-12-07 | Payer: MEDICARE

## 2017-12-07 DIAGNOSIS — I4891 Unspecified atrial fibrillation: Principal | ICD-10-CM

## 2017-12-07 DIAGNOSIS — I4892 Unspecified atrial flutter: ICD-10-CM

## 2017-12-07 DIAGNOSIS — I10 Essential (primary) hypertension: ICD-10-CM

## 2017-12-13 ENCOUNTER — Encounter: Admit: 2017-12-13 | Discharge: 2017-12-13 | Payer: MEDICARE

## 2017-12-13 DIAGNOSIS — R69 Illness, unspecified: Principal | ICD-10-CM

## 2017-12-14 ENCOUNTER — Encounter: Admit: 2017-12-14 | Discharge: 2017-12-14 | Payer: MEDICARE

## 2017-12-21 LAB — COMPREHENSIVE METABOLIC PANEL
Lab: 0.3
Lab: 1.2 — ABNORMAL HIGH (ref 0.57–1.11)
Lab: 102
Lab: 136 — ABNORMAL HIGH (ref 83–110)
Lab: 139
Lab: 16
Lab: 16 — ABNORMAL HIGH (ref 0–14)
Lab: 23
Lab: 26
Lab: 3.3 — ABNORMAL LOW (ref 3.4–4.8)
Lab: 4.6
Lab: 44 — ABNORMAL LOW (ref >59)
Lab: 7.1
Lab: 9.6
Lab: 90

## 2017-12-26 LAB — BASIC METABOLIC PANEL
Lab: 1.4 — ABNORMAL HIGH (ref 0.57–1.11)
Lab: 100
Lab: 135 — ABNORMAL HIGH (ref 83–110)
Lab: 139 — ABNORMAL LOW (ref 4.20–5.40)
Lab: 27
Lab: 32 — ABNORMAL HIGH (ref 9.8–20.1)
Lab: 38 — ABNORMAL LOW (ref >59)
Lab: 4.3 — ABNORMAL LOW (ref 12.0–16.0)
Lab: 9.3

## 2017-12-26 LAB — CBC: Lab: 9.6

## 2017-12-28 ENCOUNTER — Encounter: Admit: 2017-12-28 | Discharge: 2017-12-28 | Payer: MEDICARE

## 2018-01-04 ENCOUNTER — Encounter: Admit: 2018-01-04 | Discharge: 2018-01-04 | Payer: MEDICARE

## 2018-01-05 ENCOUNTER — Encounter: Admit: 2018-01-05 | Discharge: 2018-01-05 | Payer: MEDICARE

## 2018-01-10 ENCOUNTER — Encounter: Admit: 2018-01-10 | Discharge: 2018-01-10 | Payer: MEDICARE

## 2018-01-12 ENCOUNTER — Encounter: Admit: 2018-01-12 | Discharge: 2018-01-12 | Payer: MEDICARE

## 2018-01-16 ENCOUNTER — Encounter: Admit: 2018-01-16 | Discharge: 2018-01-16 | Payer: MEDICARE

## 2018-01-19 ENCOUNTER — Encounter: Admit: 2018-01-19 | Discharge: 2018-01-19 | Payer: MEDICARE

## 2018-01-19 DIAGNOSIS — Z79899 Other long term (current) drug therapy: ICD-10-CM

## 2018-01-19 DIAGNOSIS — I4892 Unspecified atrial flutter: Principal | ICD-10-CM

## 2018-01-19 LAB — TSH WITH FREE T4 REFLEX: Lab: 3.3

## 2018-01-25 ENCOUNTER — Encounter: Admit: 2018-01-25 | Discharge: 2018-01-25 | Payer: MEDICARE

## 2018-01-25 ENCOUNTER — Ambulatory Visit: Admit: 2018-01-25 | Discharge: 2018-01-25 | Payer: MEDICARE

## 2018-01-25 DIAGNOSIS — I607 Nontraumatic subarachnoid hemorrhage from unspecified intracranial artery: ICD-10-CM

## 2018-01-25 DIAGNOSIS — K219 Gastro-esophageal reflux disease without esophagitis: ICD-10-CM

## 2018-01-25 DIAGNOSIS — R0600 Dyspnea, unspecified: ICD-10-CM

## 2018-01-25 DIAGNOSIS — Z9981 Dependence on supplemental oxygen: ICD-10-CM

## 2018-01-25 DIAGNOSIS — Z952 Presence of prosthetic heart valve: ICD-10-CM

## 2018-01-25 DIAGNOSIS — I447 Left bundle-branch block, unspecified: Principal | ICD-10-CM

## 2018-01-25 DIAGNOSIS — I2510 Atherosclerotic heart disease of native coronary artery without angina pectoris: ICD-10-CM

## 2018-01-25 DIAGNOSIS — I350 Nonrheumatic aortic (valve) stenosis: ICD-10-CM

## 2018-01-25 DIAGNOSIS — I4892 Unspecified atrial flutter: ICD-10-CM

## 2018-01-25 DIAGNOSIS — R9389 Abnormal findings on diagnostic imaging of other specified body structures: ICD-10-CM

## 2018-01-25 DIAGNOSIS — J449 Chronic obstructive pulmonary disease, unspecified: ICD-10-CM

## 2018-01-25 DIAGNOSIS — I10 Essential (primary) hypertension: ICD-10-CM

## 2018-01-25 DIAGNOSIS — I4891 Unspecified atrial fibrillation: ICD-10-CM

## 2018-01-25 DIAGNOSIS — R569 Unspecified convulsions: ICD-10-CM

## 2018-01-25 DIAGNOSIS — I5032 Chronic diastolic (congestive) heart failure: ICD-10-CM

## 2018-01-25 DIAGNOSIS — Z8679 Personal history of other diseases of the circulatory system: ICD-10-CM

## 2018-01-26 ENCOUNTER — Encounter: Admit: 2018-01-26 | Discharge: 2018-01-26 | Payer: MEDICARE

## 2018-02-16 ENCOUNTER — Encounter: Admit: 2018-02-16 | Discharge: 2018-02-16 | Payer: MEDICARE

## 2018-03-09 ENCOUNTER — Encounter: Admit: 2018-03-09 | Discharge: 2018-03-09 | Payer: MEDICARE

## 2018-03-09 DIAGNOSIS — I4892 Unspecified atrial flutter: Secondary | ICD-10-CM

## 2018-03-09 DIAGNOSIS — Z79899 Other long term (current) drug therapy: Secondary | ICD-10-CM

## 2018-03-21 ENCOUNTER — Encounter: Admit: 2018-03-21 | Discharge: 2018-03-21 | Payer: MEDICARE

## 2018-05-06 ENCOUNTER — Encounter: Admit: 2018-05-06 | Discharge: 2018-05-07 | Payer: MEDICARE

## 2018-05-06 ENCOUNTER — Encounter: Admit: 2018-05-06 | Discharge: 2018-05-06 | Payer: MEDICARE

## 2018-05-10 ENCOUNTER — Inpatient Hospital Stay: Admit: 2018-05-10 | Discharge: 2018-05-11 | Payer: MEDICARE

## 2018-05-10 ENCOUNTER — Inpatient Hospital Stay: Admit: 2018-05-10 | Discharge: 2018-05-10 | Payer: MEDICARE

## 2018-05-11 ENCOUNTER — Encounter: Admit: 2018-05-11 | Discharge: 2018-05-11 | Payer: MEDICARE

## 2018-05-11 ENCOUNTER — Inpatient Hospital Stay: Admit: 2018-05-14 | Discharge: 2018-05-14 | Payer: MEDICARE

## 2018-05-11 DIAGNOSIS — Z950 Presence of cardiac pacemaker: ICD-10-CM

## 2018-05-11 DIAGNOSIS — I959 Hypotension, unspecified: ICD-10-CM

## 2018-05-11 DIAGNOSIS — A419 Sepsis, unspecified organism: Secondary | ICD-10-CM

## 2018-05-11 DIAGNOSIS — I5033 Acute on chronic diastolic (congestive) heart failure: Secondary | ICD-10-CM

## 2018-05-11 DIAGNOSIS — J439 Emphysema, unspecified: ICD-10-CM

## 2018-05-11 DIAGNOSIS — J9611 Chronic respiratory failure with hypoxia: ICD-10-CM

## 2018-05-11 DIAGNOSIS — R7881 Bacteremia: ICD-10-CM

## 2018-05-11 LAB — BASIC METABOLIC PANEL
Lab: 1.4 mg/dL — ABNORMAL HIGH (ref 0.4–1.00)
Lab: 164 mg/dL — ABNORMAL HIGH (ref 70–100)
Lab: 22 MMOL/L (ref 21–30)
Lab: 3.9 MMOL/L (ref 3.5–5.1)
Lab: 37 mL/min — ABNORMAL LOW (ref >60)
Lab: 41 mg/dL — ABNORMAL HIGH (ref 7–25)
Lab: 44 mL/min — ABNORMAL LOW (ref >60)
Lab: 8.4 mg/dL — ABNORMAL LOW (ref 8.5–10.6)
Lab: 11 (ref 3–12)

## 2018-05-11 LAB — URINALYSIS DIPSTICK REFLEX TO CULTURE: Lab: NEGATIVE

## 2018-05-11 LAB — BLOOD GASES, ARTERIAL
Lab: 2 MMOL/L
Lab: 42 mmHg (ref 35–45)
Lab: 97 % (ref 95–99)
Lab: 97 mmHg (ref 80–100)
Lab: 7.3 (ref 7.35–7.45)
Lab: 1.4 MMOL/L
Lab: 23 MMOL/L (ref 21–28)
Lab: 42 mmHg (ref 35–45)
Lab: 7.3 (ref 7.35–7.45)
Lab: 83 mmHg (ref 80–100)
Lab: 95 % (ref 95–99)

## 2018-05-11 LAB — COMPREHENSIVE METABOLIC PANEL
Lab: 0.9 mg/dL (ref 0.3–1.2)
Lab: 135 MMOL/L — ABNORMAL LOW (ref 137–147)
Lab: 3.1 g/dL — ABNORMAL LOW (ref 3.5–5.0)
Lab: 34 mL/min — ABNORMAL LOW (ref >60)
Lab: 41 mL/min — ABNORMAL LOW (ref >60)
Lab: 46 U/L (ref 7–56)
Lab: 52 U/L — ABNORMAL HIGH (ref 7–40)

## 2018-05-11 LAB — CBC AND DIFF
Lab: 0.1 10*3/uL (ref 0–0.20)
Lab: 0.1 10*3/uL (ref 0–0.45)
Lab: 20 10*3/uL — ABNORMAL HIGH (ref 4.5–11.0)

## 2018-05-11 LAB — POC GLUCOSE: Lab: 175 mg/dL — ABNORMAL HIGH (ref 70–100)

## 2018-05-11 LAB — URINALYSIS MICROSCOPIC REFLEX TO CULTURE

## 2018-05-11 LAB — TROPONIN-I: Lab: 0.2 ng/mL — ABNORMAL HIGH (ref 0.0–0.05)

## 2018-05-11 LAB — INFLUENZA A/B AND RSV PCR
Lab: NEGATIVE
Lab: NEGATIVE
Lab: NEGATIVE

## 2018-05-11 LAB — PROTIME INR (PT): Lab: 3.2 pg — ABNORMAL HIGH (ref 0.8–1.2)

## 2018-05-11 LAB — MAGNESIUM: Lab: 2.1 mg/dL — ABNORMAL LOW (ref 1.6–2.6)

## 2018-05-11 MED ORDER — AMIODARONE 150 MG IN D5W 100 ML IVPB LOAD
150 mg | Freq: Once | INTRAVENOUS | 0 refills | Status: CP
Start: 2018-05-11 — End: ?

## 2018-05-11 MED ORDER — WARFARIN 2 MG PO TAB
1 mg | Freq: Every evening | ORAL | 0 refills | Status: DC
Start: 2018-05-11 — End: 2018-05-12
  Administered 2018-05-12: 03:00:00 1 mg via ORAL

## 2018-05-11 MED ORDER — CEFEPIME 2G/100ML NS IVPB (MB+)
2 g | INTRAVENOUS | 0 refills | Status: DC
Start: 2018-05-11 — End: 2018-05-11
  Administered 2018-05-11 (×2): 2 g via INTRAVENOUS

## 2018-05-11 MED ORDER — BISACODYL 10 MG RE SUPP
10 mg | Freq: Once | RECTAL | 0 refills | Status: CP
Start: 2018-05-11 — End: ?
  Administered 2018-05-11: 20:00:00 10 mg via RECTAL

## 2018-05-11 MED ORDER — VANCOMYCIN PHARMACY TO MANAGE
1 | 0 refills | Status: DC
Start: 2018-05-11 — End: 2018-05-18

## 2018-05-11 MED ORDER — BUDESONIDE 0.25 MG/2 ML IN NBSP
.25 mg | Freq: Two times a day (BID) | RESPIRATORY_TRACT | 0 refills | Status: DC
Start: 2018-05-11 — End: 2018-05-18
  Administered 2018-05-12 – 2018-05-18 (×12): 0.25 mg via RESPIRATORY_TRACT

## 2018-05-11 MED ORDER — AMIODARONE IN DEXTROSE,ISO-OSM 360 MG/200 ML (1.8 MG/ML) IV SOLN
1 mg/min | INTRAVENOUS | 0 refills | Status: DC
Start: 2018-05-11 — End: 2018-05-12
  Administered 2018-05-11: 17:00:00 1 mg/min via INTRAVENOUS
  Administered 2018-05-11 – 2018-05-12 (×2): 0.5 mg/min via INTRAVENOUS

## 2018-05-11 MED ORDER — FUROSEMIDE 10 MG/ML IJ SOLN
80 mg | Freq: Once | INTRAVENOUS | 0 refills | Status: CP
Start: 2018-05-11 — End: ?
  Administered 2018-05-12: 04:00:00 80 mg via INTRAVENOUS

## 2018-05-11 MED ORDER — ATORVASTATIN 40 MG PO TAB
40 mg | Freq: Every day | ORAL | 0 refills | Status: DC
Start: 2018-05-11 — End: 2018-05-13
  Administered 2018-05-12: 14:00:00 40 mg via ORAL

## 2018-05-11 MED ORDER — VANCOMYCIN (15-40KG) IVPB
15 mg/kg | Freq: Two times a day (BID) | INTRAVENOUS | 0 refills | Status: DC
Start: 2018-05-11 — End: 2018-05-11

## 2018-05-11 MED ORDER — VANCOMYCIN 2,500 MG IVPB
2500 mg | Freq: Once | INTRAVENOUS | 0 refills | Status: CP
Start: 2018-05-11 — End: ?
  Administered 2018-05-11 (×2): 2500 mg via INTRAVENOUS

## 2018-05-11 MED ORDER — SPIRONOLACTONE 25 MG PO TAB
25 mg | Freq: Every day | ORAL | 0 refills | Status: DC
Start: 2018-05-11 — End: 2018-05-13
  Administered 2018-05-12: 14:00:00 25 mg via ORAL

## 2018-05-11 MED ORDER — CEFEPIME 2G/100ML NS IVPB (MB+)
2 g | Freq: Two times a day (BID) | INTRAVENOUS | 0 refills | Status: DC
Start: 2018-05-11 — End: 2018-05-18
  Administered 2018-05-12 – 2018-05-18 (×26): 2 g via INTRAVENOUS

## 2018-05-11 MED ORDER — PANTOPRAZOLE 40 MG PO TBEC
40 mg | Freq: Every day | ORAL | 0 refills | Status: DC
Start: 2018-05-11 — End: 2018-05-12
  Administered 2018-05-12: 03:00:00 40 mg via ORAL

## 2018-05-11 MED ORDER — VANCOMYCIN 1,750 MG IVPB
1750 mg | INTRAVENOUS | 0 refills | Status: DC
Start: 2018-05-11 — End: 2018-05-13
  Administered 2018-05-12 (×2): 1750 mg via INTRAVENOUS

## 2018-05-11 MED ORDER — AMITRIPTYLINE 50 MG PO TAB
50 mg | Freq: Every evening | ORAL | 0 refills | Status: DC
Start: 2018-05-11 — End: 2018-05-13
  Administered 2018-05-12: 03:00:00 50 mg via ORAL

## 2018-05-11 MED ORDER — METHYLPREDNISOLONE SOD SUC(PF) 125 MG/2 ML IJ SOLR
62.5 mg | Freq: Two times a day (BID) | INTRAVENOUS | 0 refills | Status: DC
Start: 2018-05-11 — End: 2018-05-12

## 2018-05-11 MED ORDER — ALBUTEROL SULFATE 2.5 MG/0.5 ML IN NEBU
2.5 mg | RESPIRATORY_TRACT | 0 refills | Status: DC | PRN
Start: 2018-05-11 — End: 2018-05-18
  Administered 2018-05-11 – 2018-05-18 (×39): 2.5 mg via RESPIRATORY_TRACT

## 2018-05-11 MED ORDER — IPRATROPIUM BROMIDE 0.02 % IN SOLN
0.5 mg | RESPIRATORY_TRACT | 0 refills | Status: DC | PRN
Start: 2018-05-11 — End: 2018-05-18
  Administered 2018-05-11 – 2018-05-18 (×40): 0.5 mg via RESPIRATORY_TRACT

## 2018-05-11 MED ORDER — PERFLUTREN LIPID MICROSPHERES 1.1 MG/ML IV SUSP
1-20 mL | Freq: Once | INTRAVENOUS | 0 refills | Status: CP | PRN
Start: 2018-05-11 — End: ?
  Administered 2018-05-11: 18:00:00 1.5 mL via INTRAVENOUS

## 2018-05-11 MED ORDER — FUROSEMIDE 10 MG/ML IJ SOLN
40 mg | Freq: Once | INTRAVENOUS | 0 refills | Status: CP
Start: 2018-05-11 — End: ?
  Administered 2018-05-11: 18:00:00 40 mg via INTRAVENOUS

## 2018-05-11 MED ORDER — IPRATROPIUM-ALBUTEROL 20-100 MCG/ACTUATION IN MIST
1 | Freq: Four times a day (QID) | RESPIRATORY_TRACT | 0 refills | Status: DC
Start: 2018-05-11 — End: 2018-05-11

## 2018-05-11 MED ORDER — ASPIRIN 81 MG PO TBEC
81 mg | Freq: Every day | ORAL | 0 refills | Status: DC
Start: 2018-05-11 — End: 2018-05-13
  Administered 2018-05-12: 14:00:00 81 mg via ORAL

## 2018-05-11 MED ORDER — POTASSIUM CHLORIDE IN WATER 10 MEQ/50 ML IV PGBK
10 meq | Freq: Once | INTRAVENOUS | 0 refills | Status: CP
Start: 2018-05-11 — End: ?
  Administered 2018-05-12: 01:00:00 10 meq via INTRAVENOUS

## 2018-05-11 MED ORDER — POLYETHYLENE GLYCOL 3350 17 GRAM PO PWPK
17 g | Freq: Every day | ORAL | 0 refills | Status: DC
Start: 2018-05-11 — End: 2018-05-13

## 2018-05-11 MED ORDER — CARVEDILOL 6.25 MG PO TAB
6.25 mg | Freq: Two times a day (BID) | ORAL | 0 refills | Status: DC
Start: 2018-05-11 — End: 2018-05-11

## 2018-05-11 MED ORDER — ALBUTEROL SULFATE 90 MCG/ACTUATION IN HFAA
2 | RESPIRATORY_TRACT | 0 refills | Status: DC | PRN
Start: 2018-05-11 — End: 2018-05-11

## 2018-05-11 MED ORDER — METRONIDAZOLE IN NACL (ISO-OS) 500 MG/100 ML IV PGBK
500 mg | INTRAVENOUS | 0 refills | Status: DC
Start: 2018-05-11 — End: 2018-05-18
  Administered 2018-05-11 – 2018-05-18 (×20): 500 mg via INTRAVENOUS

## 2018-05-11 MED ORDER — AMIODARONE 200 MG PO TAB
200 mg | Freq: Every day | ORAL | 0 refills | Status: DC
Start: 2018-05-11 — End: 2018-05-11

## 2018-05-11 MED ORDER — DIGOXIN 250 MCG/ML (0.25 MG/ML) IJ SOLN
500 ug | Freq: Once | INTRAVENOUS | 0 refills | Status: CP
Start: 2018-05-11 — End: ?
  Administered 2018-05-11: 17:00:00 500 ug via INTRAVENOUS

## 2018-05-11 MED ORDER — FERROUS SULFATE 325 MG (65 MG IRON) PO TAB
325 mg | Freq: Two times a day (BID) | ORAL | 0 refills | Status: DC
Start: 2018-05-11 — End: 2018-05-13
  Administered 2018-05-12 (×2): 325 mg via ORAL

## 2018-05-11 MED ORDER — TIOTROPIUM BROMIDE 18 MCG IN CPDV
1 | Freq: Every day | RESPIRATORY_TRACT | 0 refills | Status: DC
Start: 2018-05-11 — End: 2018-05-11

## 2018-05-11 MED ORDER — ALBUTEROL SULFATE 90 MCG/ACTUATION IN HFAA
2 | RESPIRATORY_TRACT | 0 refills | Status: DC | PRN
Start: 2018-05-11 — End: 2018-05-18

## 2018-05-11 NOTE — Procedures
ARTERIAL LINE (A-Line) PLACEMENT  ???  Indication: Hemodynamic monitoring  Fellow: Margretta Sidle, MD  Attending: Dr. Rosilyn Mings  ???  A time-out was completed verifying correct patient, procedure, site, positioning, and special equipment if applicable. The patient???s left wrist area???was prepped and draped in sterile fashion. 1% lidocaine was used and adequate local anasthesia was achieved.???The A-Line catheter was threaded over the guide wire using Seldinger technique and the needle was removed with appropriate pulsatile???blood return. The catheter was then dressed???in place to the skin and a sterile dressing applied.   ???  Verification:  Patency verified by positive blood return.   ???  Estimated Blood Loss: 5 cc    The patient tolerated the procedure well and there were no immediate complications.  ???

## 2018-05-11 NOTE — Progress Notes
Pharmacy Vancomycin Note  Subjective:   Michelle Solis is a 78 y.o. female being treated for empiric for sepsis.    Objective:     Current Vancomycin Orders   Medication Dose Route Frequency   ??? vancomycin (VANCOCIN) 2,500 mg in sodium chloride 0.9% (NS) IVPB  2,500 mg Intravenous ONCE   ??? vancomycin, pharmacy to manage  1 each Service Per Pharmacy     Start Date of  vancomycin therapy: 2018-05-30  Additional Abx: cefepime    White Blood Cells   Date/Time Value Ref Range Status   May 30, 2018 1015 20.0 (H) 4.5 - 11.0 K/UL Final     Estimated CrCl:      Intake/Output Summary (Last 24 hours) at May 30, 2018 1058  Last data filed at 2018/05/30 1020  Gross per 24 hour   Intake ???   Output 100 ml   Net -100 ml      Actual Weight:  125.9 kg (277 lb 9 oz)    Assessment:   Target levels for this patient:  1.  AUC (mcg*h/mL):  400-600    Plan:   1. Will give one time loading dose of vancomycin 2500 mg IV  followed by 1750 mg every 24 hours .  2. Next scheduled level(s): AUCs once steady state reached  3. Pharmacy will continue to monitor and adjust therapy as needed.    Junius Argyle, [facility]  May 30, 2018

## 2018-05-11 NOTE — Progress Notes
The patient came to the ER yesterday with c/o SOB that started on Sunday, worsening yesterday.  On admission to the ER her O2 sat was in the 70's on 2L/nc, her normal home amount.  O2 was increased to 5L/nc-->then placed on bipap.  The patient had no c/o chest pain.    CXR-->pulmonary edema  Troponin: elevated initially-->repeat: .449    The patient woke at 0400 in apanic and had the bipap removed-->15L--> o2 sat-95%.    BP dropped to 65 Syst hr-130-->1l/IVF started with Syst BP>80's-->levophed started-->bp:145/111 HR-120 levophed stopped.  Current BP-89 syst  HR-122 O2 sat-95% on 15L    HX: chronic afib on Coumadin, HTN, COPD-2L/NC continuously, AVR repair 1 year ago, abdominal abscess with wound vac for the past several months

## 2018-05-11 NOTE — Progress Notes
Urinary Catheter:  Yes; Retain foley due to:  Need for accurate Intake and Output  Antibiotic Usage:  Yes; Infection present or suspected:  Empiric  VTE: Per Primary  _____________________________________________________________________________    I have seen, examined and reviewed data concerning this patient.  I discussed the findings and plan of care with the ICU team. I spent 50 minutes in critical care time, excluding procedures today.    Caralee Ates, MD  Assistant Professor  Anesthesiology/Critical Care Medicine  Pager: [phone]

## 2018-05-11 NOTE — H&P (View-Only)
Heart Failure H&P Note    NAME:Michelle Solis [name]                                                                   MRN: [id number]                 DOB:29-Jul-1940          AGE: 78 y.o.  ADMISSION DATE: 05/26/2018             DAYS ADMITTED: LOS: 0 days      Chief Complaint:  Evaluation and recommendations re: heart failure.        History of Present Illness: Michelle Solis is a 78 y.o. female that presented to outside hospital with shortness of breath. 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     The patient came in on NIPPV and was on norepinephrine. Norepinephrine was able to be weaned off, and patient remained on NIPPV. History was obtained from chart review due to this. Patient initially was treated with a UTI and was on Keflex for about one week prior to coming to the hospital. About a day ago, the patient became progressively more short of breath and was at the point where she could not get out of her chair. She then presented to the hospital after this. She has a wound vac in place that reportedly has had multiple infections for the past few months, though now it appears clean, dry, intact. At outside hospital it was noted the patient's white count was elevated.                 Active Problems:    Chronic A-fib (HCC)    COPD (chronic obstructive pulmonary disease) (HCC)    Chronic respiratory failure with hypoxia (HCC)    Pulmonary edema      Plan:  Today:  1. Admit to Cardiology ICU  2. Amiodarone IV gtt, Digoxin one time for atrial fibrillation with RVR  3. Formal echocardiogram read with dilated IVC  4. CXR with bilateral interstitial infiltrates  5. Vancomycin, Cefepime, Flagyl ordered Ivabradine: No; Not treated with maximally tolerated dose beta blockers or beta blockers contraindicated    HRM Device Therapy: No (EF>35%)    Anticoagulation for current or history of atrial fibrillation/flutter: Yes      > Echocardiogram ordered - on formal read IVC appears dilated with elevated CVP.   > Suspect patient's atrial fibrillation driving potential picture of volume overload, patient appears septic as well.  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     PULM  Acute Hypoxic Respiratory Failure  - Respiratory distress on arrival. Placed on BiPAP 10/5 with improvement BiPAP  - ABG drawn on admission to [place] within normal limits - drawn while patient on BiPAP  - Will plan to wean off BiPAP as tolerated    Suspected bacterial pneumonia  - CXR appears to have bilateral pulmonary infiltrates and pulmonary edema  - Procal, ESR, CRP ordered  - Start broad coverage with Vancomycin, Cefepime, flagyl    COPD  - Continue PTA inhalers    GI  - NPO while on NIPPV  - Wound vac on abdomen, wound team consulted    Elevated LFTs  - Suspect due to congestive hepatopathy. IVC appears dilated on formal echo   - Platelets, albumin low as well  - Monitor.    RENAL/GU  AKI  - Suspect cardiorenal - IVC dilated with elevated estimated CVP on formal echo read  - Creatinine baseline appears to be ~1  - Cr 1.5 on arrival  - Urine lytes, UA ordered  - Monitor response to diuresis given what appears to be pulmonary edema on initial CXR.     ID  Septic Shock - shock now resolved  - SIRS 3/4: WBC elevated at OSH, Tachycardic in 140s, RR 26, Afebrile - Requiring norepinephrine on arrival, quickly able to wean off  - Lactate unremarkable  - Will begin infectious work up - panculture, UA, CXR, ESR, CRP, Procal, Influenza  - Low threshold to get CT scan abdomen given wound vac. Wound vac site appears clean, dry intact.   - Start Vancomycin and Cefepime, monitor response  - Suspect bacterial pneumonia, as above    Urinary tract infection  - Recently completed course of Keflex after being diagnosed with UTI approximately one week ago  - UA  - Monitor    HEME  Iron Deficiency Anemia  - Continue iron supp              Fluids, Electrolytes, Nutrition: No IVF, replace prn, NPO    Prophylaxis: On warfarin  Code: FULL - More discussion needed  Disposition: Admit to HF, CICU status    Seen and discussed with Dr. Angela Nevin, MD  Internal Medicine PGY3  Available on Voalte        ___________________________________________________________________    Review of Systems:  Review of systems not obtained due to patient factors.    Medical History:   Diagnosis Date   ??? Abnormal chest CT    ??? Aortic stenosis    ??? Atrial flutter (HCC)    ??? Cerebral aneurysm rupture (HCC) 1998   ??? COPD (chronic obstructive pulmonary disease) (HCC)    ??? Dyspnea    ??? GERD (gastroesophageal reflux disease) 09/13/2011   ??? History of rheumatic fever during childhood   ??? Hypertension 09/13/2011   ??? O2 dependent    ??? Seizures (HCC)     off medications since 2000     Surgical History:   Procedure Laterality Date   ??? HX ENDOVASCULAR ANEURYSM REPAIR  1999    coiling   ??? ANGIOGRAPHY CORONARY ARTERY WITH LEFT HEART CATHETERIZATION N/A 12/27/2016    Performed by Marcell Barlow, MD, FACC at CATH LAB   ??? POSSIBLE PERCUTANEOUS CORONARY STENT PLACEMENT WITH ANGIOPLASTY N/A 12/27/2016    Performed by Marcell Barlow, MD, FACC at CATH LAB   ??? PERCUTANEOUS TRANSCATHETER REPLACEMENT AORTIC VALVE - EVOLUT 26  PRO, LEFT COMMON FEMORAL ARTERY APPROACH Bilateral 02/01/2017 ipratropium bromide (ATROVENT) 0.02 % nebulizer solution 0.5 mg, 0.5 mg, Inhalation, Q4H & PRN  LIDOCAINE HCL 10 MG/ML (1 %) IJ SOLN (Cabinet Override), , , NOW  methylPREDNISolone (SOLU-MEDROL PF) injection 62.5 mg, 62.5 mg, Intravenous, BID  pantoprazole DR (PROTONIX) tablet 40 mg, 40 mg, Oral, QDAY(21)  polyethylene glycol 3350 (MIRALAX) packet 17 g, 17 g, Oral, QDAY  SODIUM CHLORIDE 0.9 % IV SOLP (Cabinet Override), , , NOW  SODIUM CHLORIDE 0.9 % IV SOLP (Cabinet Override), , , NOW  spironolactone (ALDACTONE) tablet 25 mg, 25 mg, Oral, QDAY  [START ON 05/12/2018] vancomycin (VANCOCIN) 1,750 mg in sodium chloride 0.9% (NS) IVPB, 1,750 mg, Intravenous, Q24H*  vancomycin (VANCOCIN) 2,500 mg in sodium chloride 0.9% (NS) IVPB, 2,500 mg, Intravenous, ONCE  warfarin (COUMADIN) tablet 1 mg, 1 mg, Oral, QHS    Continuous Infusions:  ??? amiodarone (CORDARONE) 360 mg in dextrose, iso-osm 200 mL infusion 1 mg/min (05/22/2018 1102)   ??? NOREPINEPHRINE BITARTRATE-NACL 4 MG/250 ML (16 MCG/ML) IV SOLN (Cabinet Override)       PRN and Respiratory Meds:albuterol sulfate Q4H PRN, [DISCONTINUED] vancomycin  (0-40 kg) IV Q12H* **AND** vancomycin, pharmacy to manage Per Pharmacy    Medications Prior to Admission   Medication Sig Dispense Refill Last Dose   ??? acetaminophen (TYLENOL) 325 mg tablet Take 2 Tabs by mouth every 4 hours as needed. (Patient taking differently: Take 650 mg by mouth every 6 hours as needed.) 30 Tab 1 Taking   ??? albuterol (PROAIR HFA, VENTOLIN HFA, OR PROVENTIL HFA) 90 mcg/actuation inhaler Inhale 2 puffs by mouth into the lungs every 6 hours as needed for Wheezing or Shortness of Breath. Shake well before use.   Taking   ??? amiodarone (CORDARONE) 200 mg tablet Take one tablet by mouth daily. Take with food. 90 tablet 3 Taking   ??? amitriptyline (ELAVIL) 50 mg tablet Take 50 mg by mouth at bedtime daily.   pt not taking   ??? aspirin EC 81 mg tablet Take 1 Tab by mouth daily. 90 Tab 1 Taking ??? atorvastatin (LIPITOR) 40 mg tablet Take one tablet by mouth daily. 90 tablet 3 pt not taking   ??? budesonide respule (PULMICORT) 0.25 mg/2 mL nebulizer solution Inhale 0.25 mg solution by nebulizer as directed twice daily.   Taking   ??? Calcium-Cholecalciferol (D3) (CALCIUM 600 + D) 600 mg(1,500mg ) -400 unit tab Take 1 tablet by mouth daily.   Taking   ??? carvedilol (COREG) 6.25 mg tablet Take one tablet by mouth twice daily. Take with food. 180 tablet 3 Taking   ??? docusate (COLACE) 100 mg capsule Take 100 mg by mouth at bedtime daily.   pt not taking   ??? ferrous sulfate 325 mg (65 mg iron) tablet Take 325 mg by mouth twice daily.   pt not taking   ??? fluticasone (FLONASE) 50 mcg/actuation nasal spray Apply 2 sprays to each nostril as directed daily. Shake bottle gently before using.   Taking   ??? furosemide (LASIX) 40 mg tablet TAKE ONE TABLET BY MOUTH ONCE DAILY IN THE MORNING 90 tablet 3 pt not taking   ??? ipratropium/albuterol (COMBIVENT RESPIMAT) 20-100 mcg/actuation mist inhaler Inhale 1 puff by mouth into the lungs four times daily. Indications: Bronchi Muscle Spasm resulting from COPD   pt not taking   ??? lactobacillus rhamnosus (GG) (CULTURELLE) 10 billion cell cap Take 1 capsule by mouth daily.   Taking   ??? MULTIVITAMIN PO Take  1 tablet by mouth daily.   Taking   ??? omeprazole DR(+) (PRILOSEC) 40 mg capsule Take 1 capsule by mouth daily.   pt not taking   ??? polyethylene glycol 3350 (MIRALAX) 17 g packet Take one packet by mouth daily. (Patient taking differently: Take 17 g by mouth daily as needed.) 12 each  Taking   ??? spironolactone (ALDACTONE) 25 mg tablet Take one tablet by mouth daily. Take with food. 90 tablet 3 Taking   ??? tiotropium bromide (SPRIVA RESPIMAT) 1.25 mcg/actuation inhaler Inhale 2 puffs by mouth into the lungs daily.   pt not taking   ??? vitamin A & D oint Apply  topically to affected area as Needed (Skin breakd). 1 Tube 1 pt not taking ??? vitamins, multi w/minerals 9 mg iron-400 mcg tab Take 1 tablet by mouth daily.   duplicate   ??? warfarin (COUMADIN) 1 mg tablet Take 1 mg by mouth once daily in the evening on Monday, Tuesday, Thursday, Friday, Saturday (5 days per week). Take 2mg  by mouth once daily on Wednesday and Sunday (2 days per week).  Note interaction with levofloxacin (new medication as of 7/12). (Patient taking differently: Take 1 mg by mouth once daily in the evening on Tuesday, Thursday, Saturday, and Sunday. Take 2mg  by mouth once daily on Monday, Wednesday, and Friday.  Note interaction with levofloxacin (new medication as of 7/12).) 90 tablet 3 Taking                             Vital Signs:  Last Filed                Vital Signs: 24 Hour Range   BP: 127/108 (03/06 1100)  Temp: 36.4 ???C (97.6 ???F) (03/06 1013)  Pulse: 110 (03/06 1318)  Respirations: 29 PER MINUTE (03/06 1318)  SpO2: 93 % (03/06 1318)  SpO2 Pulse: 100 (03/06 1300)  Height: 165.1 cm (65) (03/06 1100)  BP: (106-127)/(67-108)   ABP: (122-135)/(53-67)   Temp:  [36.4 ???C (97.6 ???F)]   Pulse:  [110-135]   Respirations:  [19 PER MINUTE-29 PER MINUTE]   SpO2:  [92 %-94 %]     Intensity Pain Scale (Self Report): 5 (June 06, 2018 1017)      Wt Readings from Last 10 Encounters:   06/06/2018 125.9 kg (277 lb 9 oz)   01/25/18 121.8 kg (268 lb 9.6 oz)   12/06/17 119.3 kg (263 lb)   11/21/17 120.3 kg (265 lb 3.2 oz)   11/10/17 118.4 kg (261 lb)   11/07/17 118.8 kg (261 lb 12.8 oz)   10/24/17 120.6 kg (265 lb 12.8 oz)   10/01/17 122.5 kg (270 lb)   09/21/17 121.6 kg (268 lb)   09/10/17 126 kg (277 lb 12.5 oz)       Physical Exam:    General Appearance: no distress, obese  Skin: warm and dry  Lips & Oral Mucosa: no pallor or cyanosis   Digits and Nails: normal color, smooth symmetric nails and digits  Eyes: conjunctivae and lids normal  Neck Veins: JVP positive,   Chest Inspection: left chest incision well healed  Auscultation/Percussion: breathing comfortably, lungs clear to K 4.2 06/06/2018 10:15 AM    CL 101 2018-06-06 10:15 AM    CO2 23 06/06/2018 10:15 AM    GAP 11 06-06-2018 10:15 AM    BUN 38 (H) 06/06/18 10:15 AM    BUN 32.0 (H) 12/26/2017    BUN 21.0 (  H) 12/21/2017    Lab Results   Component Value Date/Time    CR 1.50 (H) 05/14/2018 10:15 AM    CR 1.41 (H) 12/26/2017    CR 1.26 (H) 12/21/2017    GLU 169 (H) 06/02/2018 10:15 AM    CA 8.8 05/25/2018 10:15 AM    PO4 2.6 09/15/2017 04:25 AM    ALBUMIN 3.1 (L) 05/28/2018 10:15 AM        Lipid Profile INR   Lab Results   Component Value Date    CHOL 156 12/28/2016    TRIG 125 12/28/2016    HDL 38 (L) 12/28/2016    LDL 104 (H) 12/28/2016    VLDL 25 12/28/2016    NONHDLCHOL 118 12/28/2016         Lab Results   Component Value Date    INR 3.2 (H) 05/19/2018          Chest X-Ray:      Tele/ECG:     Echocardiogram Details:   Echo Results  (Last 3 results in the past 3 years)    Echo EF LVIDD LA Size IVS LVPW Rest PAP    (05/14/2018)  55 (05/29/2018)  4.16 (05/12/2018)  3.06 (05/24/2018)  1.70 (05/26/2018)  0.84 (03/20/17)  N/A    (03/20/17)  40 (03/20/17)  5.50 (03/20/17)  4.40 (03/20/17)  1.20 (03/20/17)  1.00 (02/17/17)  N/A    (02/21/17)  25 (02/17/17)  4.58 (02/17/17)  4.33 (02/17/17)  0.89 (02/17/17)  0.86 (02/02/17)  N/A

## 2018-05-11 NOTE — Progress Notes
This encounter was created in error. Please disregard. 

## 2018-05-12 ENCOUNTER — Encounter: Admit: 2018-05-12 | Discharge: 2018-05-12 | Payer: MEDICARE

## 2018-05-12 ENCOUNTER — Inpatient Hospital Stay: Admit: 2018-05-12 | Discharge: 2018-05-12 | Payer: MEDICARE

## 2018-05-12 DIAGNOSIS — A419 Sepsis, unspecified organism: Secondary | ICD-10-CM

## 2018-05-12 LAB — CBC AND DIFF
Lab: 0 % — ABNORMAL LOW (ref >60)
Lab: 0 % — ABNORMAL LOW (ref >60)
Lab: 0 10*3/uL (ref 0–0.45)
Lab: 0.1 10*3/uL (ref 0–0.20)
Lab: 0.9 10*3/uL — ABNORMAL LOW (ref 1.0–4.8)
Lab: 1.5 10*3/uL — ABNORMAL HIGH (ref 0–0.80)
Lab: 10 g/dL — ABNORMAL LOW (ref 12.0–15.0)
Lab: 198 10*3/uL — ABNORMAL HIGH (ref 150–400)
Lab: 24 10*3/uL — ABNORMAL HIGH (ref 1.8–7.0)
Lab: 26 10*3/uL — ABNORMAL HIGH (ref 4.5–11.0)
Lab: 30 pg (ref 26–34)
Lab: 31 g/dL — ABNORMAL LOW (ref 32.0–36.0)
Lab: 33 % — ABNORMAL LOW (ref 36–45)
Lab: 6 % — ABNORMAL HIGH (ref 4–12)
Lab: 90 % — ABNORMAL HIGH (ref 41–77)
Lab: 95 FL (ref 80–100)

## 2018-05-12 LAB — POC BLOOD GAS ARTERIAL
Lab: 23 MMOL/L (ref 21–28)
Lab: 3 MMOL/L
Lab: 47 mmHg — ABNORMAL HIGH (ref 35–45)
Lab: 69 mmHg — ABNORMAL LOW (ref 80–100)
Lab: 7.3 — ABNORMAL LOW (ref 7.35–7.45)
Lab: 91 % — ABNORMAL LOW (ref 95–99)

## 2018-05-12 LAB — CULTURE-URINE W/SENSITIVITY

## 2018-05-12 LAB — MAGNESIUM: Lab: 2.2 mg/dL (ref 1.6–2.6)

## 2018-05-12 LAB — BLOOD GASES, ARTERIAL: Lab: 7.2 — ABNORMAL LOW (ref 7.35–7.45)

## 2018-05-12 LAB — STREPTOCOCCUS PNEUMO AG, URINE

## 2018-05-12 LAB — LACTIC ACID(LACTATE)
Lab: 1.6 MMOL/L — ABNORMAL LOW (ref 0.5–2.0)
Lab: 1.1 MMOL/L — ABNORMAL LOW (ref 0.5–2.0)

## 2018-05-12 LAB — CREATININE-URINE RANDOM: Lab: 69 mg/dL

## 2018-05-12 LAB — SODIUM-URINE RANDOM: Lab: 18 MMOL/L

## 2018-05-12 LAB — SED RATE: Lab: 72 mm/h — ABNORMAL HIGH (ref 0–30)

## 2018-05-12 LAB — LEGIONELLA ANTIGEN URINE,RAN

## 2018-05-12 LAB — COMPREHENSIVE METABOLIC PANEL: Lab: 136 MMOL/L — ABNORMAL LOW (ref 137–147)

## 2018-05-12 LAB — POC GLUCOSE: Lab: 257 mg/dL — ABNORMAL HIGH (ref 70–100)

## 2018-05-12 LAB — UREA NITROGEN-URINE RANDOM: Lab: 520 mg/dL

## 2018-05-12 MED ORDER — PROPOFOL INJ 10 MG/ML IV VIAL
INTRAVENOUS | 0 refills | Status: CP
Start: 2018-05-12 — End: ?
  Administered 2018-05-12: 17:00:00 40 mg via INTRAVENOUS

## 2018-05-12 MED ORDER — SODIUM PHOSPHATE IVPB
16 MMOL | INTRAVENOUS | 0 refills | Status: DC | PRN
Start: 2018-05-12 — End: 2018-05-18

## 2018-05-12 MED ORDER — EPINEPHRINE 16MCG/ML IN D5W IV DRIP (STD CONC)
0-1 ug/kg/min | INTRAVENOUS | 0 refills | Status: DC
Start: 2018-05-12 — End: 2018-05-13

## 2018-05-12 MED ORDER — PANTOPRAZOLE 40 MG IV SOLR
40 mg | Freq: Every day | INTRAVENOUS | 0 refills | Status: DC
Start: 2018-05-12 — End: 2018-05-14
  Administered 2018-05-12 – 2018-05-14 (×3): 40 mg via INTRAVENOUS

## 2018-05-12 MED ORDER — INSULIN REGULAR IN 0.9 % NACL 100 UNIT/100 ML (1 UNIT/ML) IV SOLN
1-32 [IU]/h | INTRAVENOUS | 0 refills | Status: DC
Start: 2018-05-12 — End: 2018-05-18
  Administered 2018-05-12: 2.5 [IU]/h via INTRAVENOUS
  Administered 2018-05-13: 12:00:00 9.5 [IU]/h via INTRAVENOUS
  Administered 2018-05-14: 04:00:00 6 [IU]/h via INTRAVENOUS
  Administered 2018-05-15 – 2018-05-17 (×2): 2 [IU]/h via INTRAVENOUS

## 2018-05-12 MED ORDER — CRRT CITRATE REPLACEMENT SOLN-POST 5000ML
INTRAVENOUS_CENTRAL | 0 refills | Status: DC
Start: 2018-05-12 — End: 2018-05-18
  Administered 2018-05-13 – 2018-05-18 (×25): 5000.000 mL via INTRAVENOUS_CENTRAL

## 2018-05-12 MED ORDER — DIPHENHYDRAMINE HCL 50 MG/ML IJ SOLN
25 mg | INTRAVENOUS | 0 refills | Status: DC | PRN
Start: 2018-05-12 — End: 2018-05-18

## 2018-05-12 MED ORDER — FUROSEMIDE 10 MG/ML IJ SOLN
80 mg | Freq: Once | INTRAVENOUS | 0 refills | Status: CP
Start: 2018-05-12 — End: ?
  Administered 2018-05-12: 20:00:00 80 mg via INTRAVENOUS

## 2018-05-12 MED ORDER — SODIUM PHOSPHATE IVPB
30 MMOL | INTRAVENOUS | 0 refills | Status: DC | PRN
Start: 2018-05-12 — End: 2018-05-18

## 2018-05-12 MED ORDER — EPINEPHRINE 64MCG/ML IN D5W IV DRIP (QUAD CONC)
0-1 ug/kg/min | INTRAVENOUS | 0 refills | Status: DC
Start: 2018-05-12 — End: 2018-05-16
  Administered 2018-05-13 (×2): 0.15 ug/kg/min via INTRAVENOUS
  Administered 2018-05-13 (×2): 0.1 ug/kg/min via INTRAVENOUS
  Administered 2018-05-14 (×2): 0.08 ug/kg/min via INTRAVENOUS

## 2018-05-12 MED ORDER — HYDROCORTISONE SOD SUCC (PF) 100 MG/2 ML IJ SOLR
50 mg | INTRAVENOUS | 0 refills | Status: DC
Start: 2018-05-12 — End: 2018-05-14
  Administered 2018-05-12 – 2018-05-14 (×7): 50 mg via INTRAVENOUS

## 2018-05-12 MED ORDER — AMITRIPTYLINE(#) 20MG/ML PO SUSP
50 mg | Freq: Every evening | ORAL | 0 refills | Status: DC
Start: 2018-05-12 — End: 2018-05-14

## 2018-05-12 MED ORDER — CALCIUM GLUCONATE 1GM/100ML NS MB+
1 g | INTRAVENOUS | 0 refills | Status: DC | PRN
Start: 2018-05-12 — End: 2018-05-18

## 2018-05-12 MED ORDER — CHLOROTHIAZIDE SODIUM 500 MG IV SOLR
500 mg | Freq: Once | INTRAVENOUS | 0 refills | Status: CP
Start: 2018-05-12 — End: ?
  Administered 2018-05-12: 20:00:00 500 mg via INTRAVENOUS

## 2018-05-12 MED ORDER — ASPIRIN 81 MG PO CHEW
81 mg | Freq: Every day | NASOGASTRIC | 0 refills | Status: DC
Start: 2018-05-12 — End: 2018-05-18
  Administered 2018-05-13 – 2018-05-18 (×5): 81 mg via NASOGASTRIC

## 2018-05-12 MED ORDER — ALUMINUM-MAGNESIUM HYDROXIDE 200-200 MG/5 ML PO SUSP
30 mL | ORAL | 0 refills | Status: DC | PRN
Start: 2018-05-12 — End: 2018-05-13

## 2018-05-12 MED ORDER — DIPHENHYDRAMINE HCL 25 MG PO CAP
25 mg | ORAL | 0 refills | Status: DC | PRN
Start: 2018-05-12 — End: 2018-05-13

## 2018-05-12 MED ORDER — METHYLPREDNISOLONE SOD SUC(PF) 125 MG/2 ML IJ SOLR
62.5 mg | Freq: Once | INTRAVENOUS | 0 refills | Status: CP
Start: 2018-05-12 — End: ?
  Administered 2018-05-12: 15:00:00 62.5 mg via INTRAVENOUS

## 2018-05-12 MED ORDER — SODIUM CHLORIDE 0.9 % IV SOLP
1000-2000 mL | 0 refills | Status: DC | PRN
Start: 2018-05-12 — End: 2018-05-18
  Administered 2018-05-13: 10:00:00 250 mL
  Administered 2018-05-13 – 2018-05-17 (×4): 1000 mL

## 2018-05-12 MED ORDER — CALCIUM CHLORIDE 8G/NACL 0.9% IV SOLN 1000ML
8 g | INTRAVENOUS | 0 refills | Status: DC
Start: 2018-05-12 — End: 2018-05-18
  Administered 2018-05-13 – 2018-05-18 (×28): 8 g via INTRAVENOUS

## 2018-05-12 MED ORDER — CHLOROTHIAZIDE SODIUM 500 MG IV SOLR
500 mg | Freq: Once | INTRAVENOUS | 0 refills | Status: DC
Start: 2018-05-12 — End: 2018-05-12

## 2018-05-12 MED ORDER — FENTANYL DRIP IN NS 1000MCG/100ML
10-150 ug/h | INTRAVENOUS | 0 refills | Status: DC
Start: 2018-05-12 — End: 2018-05-18
  Administered 2018-05-12: 18:00:00 25 ug/h via INTRAVENOUS
  Administered 2018-05-14: 19:00:00 100 ug/h via INTRAVENOUS
  Administered 2018-05-14: 02:00:00 40 ug/h via INTRAVENOUS
  Administered 2018-05-15 (×3): 125 ug/h via INTRAVENOUS
  Administered 2018-05-16: 09:00:00 150 ug/h via INTRAVENOUS
  Administered 2018-05-16 (×2): 125 ug/h via INTRAVENOUS
  Administered 2018-05-16: 16:00:00 150 ug/h via INTRAVENOUS
  Administered 2018-05-17: 15:00:00 100 ug/h via INTRAVENOUS
  Administered 2018-05-17: 06:00:00 125 ug/h via INTRAVENOUS
  Administered 2018-05-18: 08:00:00 60 ug/h via INTRAVENOUS

## 2018-05-12 MED ORDER — ONDANSETRON HCL (PF) 4 MG/2 ML IJ SOLN
4 mg | INTRAVENOUS | 0 refills | Status: DC | PRN
Start: 2018-05-12 — End: 2018-05-18

## 2018-05-12 MED ORDER — ACETAMINOPHEN 325 MG PO TAB
650 mg | ORAL | 0 refills | Status: DC | PRN
Start: 2018-05-12 — End: 2018-05-13

## 2018-05-12 MED ORDER — AMIODARONE 150 MG IN D5W 100 ML IVPB LOAD
150 mg | Freq: Once | INTRAVENOUS | 0 refills | Status: CP
Start: 2018-05-12 — End: ?
  Administered 2018-05-13 (×2): 150 mg via INTRAVENOUS

## 2018-05-12 MED ORDER — VASOPRESSIN 20 UNITS IN 100ML IV INFUSION
.6-3.6 [IU]/h | INTRAVENOUS | 0 refills | Status: DC
Start: 2018-05-12 — End: 2018-05-18
  Administered 2018-05-12 – 2018-05-16 (×21): 2.4 [IU]/h via INTRAVENOUS
  Administered 2018-05-16: 20:00:00 3.6 [IU]/h via INTRAVENOUS
  Administered 2018-05-16 (×2): 2.4 [IU]/h via INTRAVENOUS
  Administered 2018-05-16 – 2018-05-18 (×15): 3.6 [IU]/h via INTRAVENOUS

## 2018-05-12 MED ORDER — EPINEPHRINE 16MCG/ML IN D5W IV DRIP (STD CONC)
0-1 ug/kg/min | INTRAVENOUS | 0 refills | Status: DC
Start: 2018-05-12 — End: 2018-05-13
  Administered 2018-05-12 (×4): 0.1 ug/kg/min via INTRAVENOUS

## 2018-05-12 MED ORDER — NALOXONE 0.4 MG/ML IJ SOLN
.08 mg | INTRAVENOUS | 0 refills | Status: DC | PRN
Start: 2018-05-12 — End: 2018-05-18

## 2018-05-12 MED ORDER — POLYETHYLENE GLYCOL 3350 17 GRAM PO PWPK
17 g | Freq: Every day | NASOGASTRIC | 0 refills | Status: DC
Start: 2018-05-12 — End: 2018-05-15
  Administered 2018-05-13 – 2018-05-15 (×2): 17 g via NASOGASTRIC

## 2018-05-12 MED ORDER — AMIODARONE IN DEXTROSE,ISO-OSM 360 MG/200 ML (1.8 MG/ML) IV SOLN
.5-1 mg/min | INTRAVENOUS | 0 refills | Status: DC
Start: 2018-05-12 — End: 2018-05-14
  Administered 2018-05-13 – 2018-05-14 (×4): 0.5 mg/min via INTRAVENOUS

## 2018-05-12 MED ORDER — NOREPINEPHRINE 64 MCG/ML IN D5W IV DRIP (QUAD CONC)
0-.5 ug/kg/min | INTRAVENOUS | 0 refills | Status: DC
Start: 2018-05-12 — End: 2018-05-18
  Administered 2018-05-12 (×2): 0.28 ug/kg/min via INTRAVENOUS
  Administered 2018-05-13 (×2): 0.2 ug/kg/min via INTRAVENOUS
  Administered 2018-05-13 (×2): 0.3 ug/kg/min via INTRAVENOUS
  Administered 2018-05-14 (×2): 0.18 ug/kg/min via INTRAVENOUS
  Administered 2018-05-14: 04:00:00 0.2 ug/kg/min via INTRAVENOUS
  Administered 2018-05-14 (×2): 0.14 ug/kg/min via INTRAVENOUS
  Administered 2018-05-14: 04:00:00 0.2 ug/kg/min via INTRAVENOUS
  Administered 2018-05-15 (×2): 0.14 ug/kg/min via INTRAVENOUS
  Administered 2018-05-15 (×2): 0.15 ug/kg/min via INTRAVENOUS
  Administered 2018-05-16 (×2): 0.12 ug/kg/min via INTRAVENOUS
  Administered 2018-05-17 (×2): 0.18 ug/kg/min via INTRAVENOUS
  Administered 2018-05-17: 03:00:00 0.23 ug/kg/min via INTRAVENOUS
  Administered 2018-05-17: 18:00:00 0.18 ug/kg/min via INTRAVENOUS
  Administered 2018-05-17: 03:00:00 0.23 ug/kg/min via INTRAVENOUS
  Administered 2018-05-17: 16:00:00 0.18 ug/kg/min via INTRAVENOUS
  Administered 2018-05-18 (×2): 0.2 ug/kg/min via INTRAVENOUS

## 2018-05-12 MED ORDER — EPINEPHRINE 0.1 MG/ML IJ SYRG
0 refills | Status: CP
Start: 2018-05-12 — End: ?
  Administered 2018-05-12: 19:00:00 1 mg via INTRAVENOUS

## 2018-05-12 MED ORDER — FUROSEMIDE IVPB (120 MG - 160 MG DOSES)
160 mg | Freq: Once | INTRAVENOUS | 0 refills | Status: DC
Start: 2018-05-12 — End: 2018-05-12

## 2018-05-12 MED ORDER — POTASSIUM CHLORIDE IN WATER 10 MEQ/50 ML IV PGBK
10 meq | INTRAVENOUS | 0 refills | Status: CP
Start: 2018-05-12 — End: ?
  Administered 2018-05-12 (×2): 10 meq via INTRAVENOUS

## 2018-05-12 MED ORDER — SPIRONOLACTONE 25 MG PO TAB
25 mg | Freq: Every day | NASOGASTRIC | 0 refills | Status: DC
Start: 2018-05-12 — End: 2018-05-13

## 2018-05-12 MED ORDER — NOREPINEPHRINE BITARTRATE-NACL 4 MG/250 ML (16 MCG/ML) IV SOLN
0-.5 ug/kg/min | INTRAVENOUS | 0 refills | Status: DC
Start: 2018-05-12 — End: 2018-05-12
  Administered 2018-05-12: 18:00:00 0.05 ug/kg/min via INTRAVENOUS

## 2018-05-12 MED ORDER — CRRT CITRATE DIALYSATE SOLUTION 5000ML
INTRAVENOUS_CENTRAL | 0 refills | Status: DC
Start: 2018-05-12 — End: 2018-05-18
  Administered 2018-05-13 – 2018-05-18 (×72): 5000.000 mL via INTRAVENOUS_CENTRAL

## 2018-05-12 MED ORDER — SODIUM PHOSPHATE IVPB
24 MMOL | INTRAVENOUS | 0 refills | Status: DC | PRN
Start: 2018-05-12 — End: 2018-05-18

## 2018-05-12 MED ORDER — PROPOFOL 10 MG/ML IV EMUL
5-100 ug/kg/min | INTRAVENOUS | 0 refills | Status: DC
Start: 2018-05-12 — End: 2018-05-18
  Administered 2018-05-12 – 2018-05-13 (×2): 10 ug/kg/min via INTRAVENOUS
  Administered 2018-05-13: 18:00:00 5 ug/kg/min via INTRAVENOUS
  Administered 2018-05-14: 18:00:00 15 ug/kg/min via INTRAVENOUS
  Administered 2018-05-14: 07:00:00 10 ug/kg/min via INTRAVENOUS
  Administered 2018-05-15: 15 ug/kg/min via INTRAVENOUS
  Administered 2018-05-15: 02:00:00 15.031 ug/kg/min via INTRAVENOUS
  Administered 2018-05-15: 16:00:00 15 ug/kg/min via INTRAVENOUS
  Administered 2018-05-15: 11:00:00 15.031 ug/kg/min via INTRAVENOUS
  Administered 2018-05-16: 18:00:00 10 ug/kg/min via INTRAVENOUS
  Administered 2018-05-16 (×2): 25 ug/kg/min via INTRAVENOUS
  Administered 2018-05-16: 09:00:00 30 ug/kg/min via INTRAVENOUS
  Administered 2018-05-16: 13:00:00 25 ug/kg/min via INTRAVENOUS
  Administered 2018-05-16: 05:00:00 20 ug/kg/min via INTRAVENOUS
  Administered 2018-05-17: 22:00:00 15 ug/kg/min via INTRAVENOUS
  Administered 2018-05-17 (×2): 19 ug/kg/min via INTRAVENOUS
  Administered 2018-05-18 (×2): 15 ug/kg/min via INTRAVENOUS

## 2018-05-12 MED ORDER — ATORVASTATIN 40 MG PO TAB
40 mg | Freq: Every day | NASOGASTRIC | 0 refills | Status: DC
Start: 2018-05-12 — End: 2018-05-18
  Administered 2018-05-13 – 2018-05-18 (×5): 40 mg via NASOGASTRIC

## 2018-05-12 MED ORDER — CITRATE DEXTROSE SOLUTION MISC SOLN
1000 mL | 0 refills | Status: DC
Start: 2018-05-12 — End: 2018-05-18
  Administered 2018-05-13 – 2018-05-18 (×33): 1000 mL

## 2018-05-12 MED ORDER — SUCCINYLCHOLINE CHLORIDE 20 MG/ML IJ SOLN
INTRAVENOUS | 0 refills | Status: CP
Start: 2018-05-12 — End: ?
  Administered 2018-05-12: 17:00:00 140 mg via INTRAVENOUS

## 2018-05-12 MED ORDER — CHLORHEXIDINE GLUCONATE 0.12 % MM MWSH
15 mL | Freq: Two times a day (BID) | ORAL | 0 refills | Status: DC
Start: 2018-05-12 — End: 2018-05-13

## 2018-05-12 MED ADMIN — SODIUM CHLORIDE 0.9 % IV SOLP [27838]: 1000 mL | @ 03:00:00 | Stop: 2018-05-12 | NDC 00338004904

## 2018-05-12 MED ADMIN — WATER FOR INJECTION, STERILE IJ SOLN [79513]: 20 mL | INTRAVENOUS | @ 20:00:00 | Stop: 2018-05-12 | NDC 00409488723

## 2018-05-12 NOTE — Progress Notes
Patient's son notified me that they have decided to proceed with aggressive therapies and see how Michelle Solis progresses over the coming days. Will make arrangements for immediate intubation as further delay will make the procedure more dangerous with her worsening respiratory status. After securing the airway, will proceed with CVL and PA catheter placement. Heart failure team updated.    Caralee Ates, MD  Assistant Professor  Anesthesiology/Critical Care Medicine  Pager: [phone]

## 2018-05-12 NOTE — Progress Notes
Heart Failure Progress Note    NAME:Michelle Solis                                                                   MRN: [id number]                 DOB:12-06-1940          AGE: 78 y.o.  ADMISSION DATE: 05-20-18             DAYS ADMITTED: LOS: 1 day     Assessment:  Active Problems:    Chronic A-fib (HCC)    COPD (chronic obstructive pulmonary disease) (HCC)    Chronic respiratory failure with hypoxia (HCC)    Pulmonary edema       Plan:  Today:  1. Blood cultures positive for gram positive rods  2. Blood cultures at OSH positive for corynebacterium  3. Continue broad spectrum coverage with vancomycin, cefepime, flagyl  4. Would like to intubate given work of breathing, patient would like to discuss with family, awaiting family to arrive prior to electing for intubation  5. Amiodarone IV gtt, Digoxin one time for atrial fibrillation with RVR  6. Formal echocardiogram read with dilated IVC; however our team read IVC does not appear dilated  7. Repeat CXR with bilateral interstitial infiltrates  8. Monitor renal function  9. Monitor elevated LFTs, suspect congestive hepatopathy  10. If patient/family were to elect for intubation would consider TEE and /or bronchoscopy  11. Infectious Disease consult given positive blood cultures    ???  ???  Ongoing:  1. BMP twice a day. Magnesium level daily.??? Keep Potassium greater than 4.0 and Magnesium greater than 2.0.  2. 2000mg  sodium dietary restriction.  3. Fluid Restriction:2  4. Strict I/O. Goal output: net neg 1L/24 hour  5. Follow up appointment with a member of the HF team or PCP within 7 calendar days of discharge.  ???  ???  ???  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  Major Complications or Comorbidities [facility]): acute respiratory failure  NYHA functional class II (slight limitation of physical activity, comfortable at rest, but ordinary physical activity results in symptoms of HF e.g., walking or climbing stairs), Urinary tract infection - resolved  - Recently completed course of Keflex after being diagnosed with UTI approximately one week ago  - UA looks relatively bland  - Monitor  ???  HEME  Iron Deficiency Anemia  - Continue iron supp    Supratherapeutic INR  - Hold warfarin this evening 3/7  ???  ???  ???  ???  ???  ???  Fluids, Electrolytes, Nutrition: No IVF, replace prn, NPO    Prophylaxis: On warfarin  Code: FULL - More discussion needed  Disposition: Admit to HF, CICU status  ???  Seen and discussed with Dr. Chales Abrahams  ???  Leighton Parody, MD  Internal Medicine PGY3  Available on Voalte        _____________________________________________________________________________    Subjective:     Today the patient is still having an increased work of breathing. Denies fevers, chills, nausea, vomiting, abdominal pain.     Review of Systems:  Review of systems not obtained from patient due to patient factors.  Objective:  albuterol 0.5% (PROVENTIL) nebulizer solution 2.5 mg, 2.5 mg, Inhalation, Q4H & PRN  amitriptyline (ELAVIL) tablet 50 mg, 50 mg, Oral, QHS  aspirin EC tablet 81 mg, 81 mg, Oral, QDAY  atorvastatin (LIPITOR) tablet 40 mg, 40 mg, Oral, QDAY  budesonide respule (PULMICORT) nebulizer solution 0.25 mg, 0.25 mg, Inhalation, BID  cefepime (MAXIPIME) 2 g in sodium chloride 0.9% (NS) 100 mL IVPB (MB+), 2 g, Intravenous, Q12H*  ferrous sulfate (FEOSOL) tablet 325 mg, 325 mg, Oral, BID  ipratropium bromide (ATROVENT) 0.02 % nebulizer solution 0.5 mg, 0.5 mg, Inhalation, Q4H & PRN  metroNIDAZOLE (FLAGYL) 500 mg IVPB 100 mL, 500 mg, Intravenous, Q8H*  pantoprazole DR (PROTONIX) tablet 40 mg, 40 mg, Oral, QDAY(21)  polyethylene glycol 3350 (MIRALAX) packet 17 g, 17 g, Oral, QDAY  potassium chloride in water IVPB 10 mEq, 10 mEq, Intravenous, Q1H X 2DO  spironolactone (ALDACTONE) tablet 25 mg, 25 mg, Oral, QDAY  vancomycin (VANCOCIN) 1,750 mg in sodium chloride 0.9% (NS) IVPB, 1,750 mg, Intravenous, Q24H* Blood,UA 3+ (A) NEG-NEG    Urobilinogen,UA NORMAL NORM-NORMAL    Nitrite,UA NEG NEG-NEG    Leukocytes,UA TRACE (A) NEG-NEG    Urine Ascorbic Acid, UA NEG NEG-NEG   URINALYSIS MICROSCOPIC REFLEX TO CULTURE    Collection Time: 05/14/2018 11:25 AM   Result Value Ref Range    WBCs,UA 10-20 0 - 2 /HPF    RBCs,UA PACKED 0 - 3 /HPF    Comment,UA       Criteria for reflex to culture are WBC>10, Positive Nitrite, and/or >=+1   leukocytes. If quantity is not sufficient, an addendum will follow.      MucousUA TRACE     Squamous Epithelial Cells 0-2 0 - 5    Hyaline Cast 2-5    LEGIONELLA ANTIGEN URINE,RAN    Collection Time: 05/19/2018 11:25 AM   Result Value Ref Range    Battery Name LEGIONELLA URINE ANTIGEN     Specimen Description URINE     Special Requests NONE     Antigen NEGATIVE     Report Status FINAL  05/12/2018      STREPTOCOCCUS PNEUMO AG, URINE    Collection Time: 05/22/2018 11:25 AM   Result Value Ref Range    Battery Name STREP PNEUMO AG, [place]     Specimen Description URINE     Special Requests NONE     Antigen NEGATIVE     Report Status FINAL  05/12/2018      CULTURE-BLOOD W/SENSITIVITY    Collection Time: 05/22/2018  1:05 PM   Result Value Ref Range    Battery Name BLOOD CULTURE     Specimen Description BLOOD  LEFT  FA       Special Requests NONE     Culture NO GROWTH 1 DAY     Report Status     TROPONIN-I    Collection Time: 05/20/2018  3:53 PM   Result Value Ref Range    Troponin-I 0.24 (H) 0.0 - 0.05 NG/ML   BLOOD GASES, ARTERIAL    Collection Time: 05/15/2018  3:53 PM   Result Value Ref Range    pH-Arterial 7.36 7.35 - 7.45    pCO2-Arterial 42 35 - 45 MMHG    pO2-Arterial 83 80 - 100 MMHG    Base Deficit-Arterial 1.4 MMOL/L    O2 Sat-Arterial 95.4 95 - 99 %    Bicarbonate-ART-Cal 23.2 21 - 28 MMOL/L   BASIC METABOLIC PANEL    Collection Time: 05/14/2018  3:53 PM   Result Value Ref Range    Sodium 136 (L) 137 - 147 MMOL/L    Potassium 3.9 3.5 - 5.1 MMOL/L    Chloride 103 98 - 110 MMOL/L    CO2 22 21 - 30 MMOL/L Anion Gap 11 3 - 12    Glucose 164 (H) 70 - 100 MG/DL    Blood Urea Nitrogen 41 (H) 7 - 25 MG/DL    Creatinine 3.24 (H) 0.4 - 1.00 MG/DL    Calcium 8.4 (L) 8.5 - 10.6 MG/DL    eGFR Non African American 37 (L) >60 mL/min    eGFR African American 44 (L) >60 mL/min   MAGNESIUM    Collection Time: 05-20-2018  3:53 PM   Result Value Ref Range    Magnesium 2.0 1.6 - 2.6 mg/dL   POC GLUCOSE    Collection Time: 2018/05/20  5:34 PM   Result Value Ref Range    Glucose, POC 175 (H) 70 - 100 MG/DL   SODIUM-URINE RANDOM    Collection Time: 05-20-18  6:02 PM   Result Value Ref Range    Sodium, Random 18 MMOL/L   CREATININE-URINE RANDOM    Collection Time: 05-20-18  6:02 PM   Result Value Ref Range    Creatinine, Random 69 MG/DL   UREA NITROGEN-URINE RANDOM    Collection Time: 05/20/2018  6:02 PM   Result Value Ref Range    Urea Nitrogen 520 MG/DL   CBC AND DIFF    Collection Time: 05/12/18  4:00 AM   Result Value Ref Range    White Blood Cells 18.7 (H) 4.5 - 11.0 K/UL    RBC 3.48 (L) 4.0 - 5.0 M/UL    Hemoglobin 10.7 (L) 12.0 - 15.0 GM/DL    Hematocrit 40.1 (L) 36 - 45 %    MCV 95.0 80 - 100 FL    MCH 30.7 26 - 34 PG    MCHC 32.3 32.0 - 36.0 G/DL    RDW 02.7 (H) 11 - 15 %    Platelet Count 119 (L) 150 - 400 K/UL    MPV 8.5 7 - 11 FL    Neutrophils 87 (H) 41 - 77 %    Lymphocytes 2 (L) 24 - 44 %    Monocytes 11 4 - 12 %    Eosinophils 0 0 - 5 %    Basophils 0 0 - 2 %    Absolute Neutrophil Count 16.20 (H) 1.8 - 7.0 K/UL    Absolute Lymph Count 0.30 (L) 1.0 - 4.8 K/UL    Absolute Monocyte Count 2.10 (H) 0 - 0.80 K/UL    Absolute Eosinophil Count 0.10 0 - 0.45 K/UL    Absolute Basophil Count 0.00 0 - 0.20 K/UL   COMPREHENSIVE METABOLIC PANEL    Collection Time: 05/12/18  4:00 AM   Result Value Ref Range    Sodium 138 137 - 147 MMOL/L    Potassium 3.9 3.5 - 5.1 MMOL/L    Chloride 104 98 - 110 MMOL/L    Glucose 179 (H) 70 - 100 MG/DL    Blood Urea Nitrogen 49 (H) 7 - 25 MG/DL    Creatinine 2.53 (H) 0.4 - 1.00 MG/DL

## 2018-05-12 NOTE — Care Plan
Problem: Infection, Risk of, Urinary Catheter-Associated Urinary Tract Infection  Goal: Absence of urinary catheter-associated infection  Outcome: Goal Ongoing     Problem: Falls, High Risk of  Goal: Absence of falls-Adult Patient  Outcome: Goal Ongoing   Fall risk bundle in place    Problem: Tissue Perfusion, Altered  Goal: Adequate tissue perfusion  Outcome: Goal Ongoing   Vital signs stable, pt turned every two hours

## 2018-05-12 NOTE — Anesthesia Procedure Notes
Anesthesia Procedure: Central Venous Catheter Line    CENTRAL LINE INSERTION  Date/Time: 05/12/2018 12:00 PM    Patient location: ICU  Indications: medications requiring CV access, hemodynamic pressure monitoring and vascular access    Preprocedure checklist performed: 2 patient identifiers, risks & benefits discussed, patient evaluated, timeout performed, consent obtained, patient being monitored and CVC bundle followed (proper hand washing, maximal sterile barrier technique with cap, sterile gown, sterile glove, sterile drape, and skin prep for antisepsis)        CVC Line Insertion Procedure  Skin prepped with chlorhexidine; skin prep agent completely dried prior to procedure.  Patient Position: Trendelenburg  Location: internal jugular vein  Laterality: right  Vein identification: ultrasound guided  Confirmation of venous placement prior to dilation of vein by: ultrasound  Number of attempts: 1  Successful placement: yes  Patient sedated: yes      Catheter:     Catheter type: introducer placed using standard wire through needle technique     Catheter size: 9 Fr      Procedure Outcome  Post procedure: all ports aspirated, dressing applied and line sutured; Dressing: chlorhexidine impregnated sponge  Placement verification: placement verified by x-ray  Events: none  Observations: patient tolerated well      Additional notes: ATTESTATION    I was present during the entire procedure performed by a fellow.    Staff name:  Gabriel Cirri, MD Date:  05/12/2018        Performed by: Gabriel Cirri, MD  Authorized by: Gabriel Cirri, MD

## 2018-05-13 DIAGNOSIS — A419 Sepsis, unspecified organism: Secondary | ICD-10-CM

## 2018-05-13 LAB — POC BLOOD GAS ARTERIAL
Lab: 21 MMOL/L (ref 21–28)
Lab: 49 mmHg — ABNORMAL HIGH (ref 35–45)
Lab: 7.2 — ABNORMAL LOW (ref 7.35–7.45)
Lab: 26 MMOL/L (ref 21–28)
Lab: 85 mmHg — ABNORMAL HIGH (ref 35–45)
Lab: 7.1 — CL (ref 7.35–7.45)

## 2018-05-13 LAB — POC GLUCOSE
Lab: 105 mg/dL — ABNORMAL HIGH (ref 70–100)
Lab: 108 mg/dL — ABNORMAL HIGH (ref 70–100)
Lab: 109 mg/dL — ABNORMAL HIGH (ref 70–100)
Lab: 113 mg/dL — ABNORMAL HIGH (ref 70–100)
Lab: 115 mg/dL — ABNORMAL HIGH (ref 70–100)
Lab: 123 mg/dL — ABNORMAL HIGH (ref 70–100)
Lab: 128 mg/dL — ABNORMAL HIGH (ref 70–100)
Lab: 147 mg/dL — ABNORMAL HIGH (ref 70–100)
Lab: 148 mg/dL — ABNORMAL HIGH (ref 70–100)
Lab: 150 mg/dL — ABNORMAL HIGH (ref 70–100)
Lab: 159 mg/dL — ABNORMAL HIGH (ref 70–100)
Lab: 161 mg/dL — ABNORMAL HIGH (ref 70–100)
Lab: 177 mg/dL — ABNORMAL HIGH (ref 70–100)
Lab: 185 mg/dL — ABNORMAL HIGH (ref 70–100)
Lab: 188 mg/dL — ABNORMAL HIGH (ref 70–100)
Lab: 190 mg/dL — ABNORMAL HIGH (ref 70–100)
Lab: 230 mg/dL — ABNORMAL HIGH (ref 70–100)
Lab: 231 mg/dL — ABNORMAL HIGH (ref 70–100)
Lab: 245 mg/dL — ABNORMAL HIGH (ref 70–100)
Lab: 261 mg/dL — ABNORMAL HIGH (ref 70–100)
Lab: 261 mg/dL — ABNORMAL HIGH (ref 70–100)

## 2018-05-13 LAB — BLOOD GASES, ARTERIAL
Lab: 16 MMOL/L — ABNORMAL LOW (ref 21–28)
Lab: 166 mmHg — ABNORMAL HIGH (ref 80–100)
Lab: 7 — CL (ref 7.35–7.45)
Lab: 9.4 MMOL/L
Lab: 98 % (ref 95–99)
Lab: 99 mmHg — ABNORMAL HIGH (ref 35–45)
Lab: 71 mmHg — ABNORMAL HIGH (ref 35–45)
Lab: 7.1 — CL (ref 7.35–7.45)
Lab: 145 mmHg — ABNORMAL HIGH (ref 80–100)
Lab: 22 MMOL/L — ABNORMAL LOW (ref 21–28)
Lab: 7.2 % — ABNORMAL LOW (ref 7.35–7.45)
Lab: 0.6 MMOL/L
Lab: 23 MMOL/L (ref 21–28)
Lab: 63 mmHg — ABNORMAL HIGH (ref 35–45)
Lab: 7.2 — ABNORMAL LOW (ref 7.35–7.45)

## 2018-05-13 LAB — COMPREHENSIVE METABOLIC PANEL: Lab: 140 MMOL/L — ABNORMAL LOW (ref 137–147)

## 2018-05-13 LAB — RVP VIRAL PANEL PCR

## 2018-05-13 LAB — POC IONIZED CALCIUM
Lab: 1.2 MMOL/L (ref 1.0–1.3)
Lab: 1 MMOL/L (ref 1.0–1.3)

## 2018-05-13 LAB — BASIC METABOLIC PANEL CELLULAR THERAPEUTICS
Lab: 137 MMOL/L (ref 137–147)
Lab: 2.7 mg/dL — ABNORMAL HIGH (ref 0.4–1.00)
Lab: 22 MMOL/L (ref 21–30)
Lab: 255 mg/dL — ABNORMAL HIGH (ref 70–100)
Lab: 69 mg/dL — ABNORMAL HIGH (ref 7–25)
Lab: 10 (ref 3–12)
Lab: 139 MMOL/L (ref 137–147)
Lab: 31 mg/dL — ABNORMAL HIGH (ref 7–25)
Lab: 41 mL/min — ABNORMAL LOW (ref >60)
Lab: 49 mL/min — ABNORMAL LOW (ref >60)
Lab: 8.1 mg/dL — ABNORMAL LOW (ref 8.5–10.6)

## 2018-05-13 LAB — IONIZED CALCIUM, POST FILTER
Lab: 0.4 MMOL/L (ref 21–30)
Lab: 0.4 MMOL/L (ref >60)
Lab: 0.4 MMOL/L
Lab: 0.4 MMOL/L

## 2018-05-13 LAB — POC HEMATOCRIT
Lab: 12 g/dL (ref 12.0–15.0)
Lab: 11 g/dL — ABNORMAL LOW (ref 12.0–15.0)
Lab: 35 % — ABNORMAL LOW (ref 36–45)
Lab: 11 g/dL — ABNORMAL LOW (ref 12.0–15.0)

## 2018-05-13 LAB — CBC AND DIFF
Lab: 0.1 10*3/uL (ref 0–0.20)
Lab: 0.2 10*3/uL (ref 0–0.45)
Lab: 0.4 10*3/uL — ABNORMAL LOW (ref 1.0–4.8)
Lab: 0.9 10*3/uL — ABNORMAL HIGH (ref 0–0.80)
Lab: 1 % (ref 0–5)
Lab: 16 % — ABNORMAL HIGH (ref 11–15)
Lab: 23 10*3/uL — ABNORMAL HIGH (ref 4.5–11.0)
Lab: 8 FL — ABNORMAL HIGH (ref 7–11)
Lab: 93 % — ABNORMAL HIGH (ref 41–77)
Lab: 95 FL — ABNORMAL HIGH (ref 80–100)

## 2018-05-13 LAB — PHOSPHORUS  CELLULAR THERAPEUTICS
Lab: 6.3 mg/dL — ABNORMAL HIGH (ref 2.0–4.5)
Lab: 4 mg/dL (ref 2.0–4.5)

## 2018-05-13 LAB — GRAM STAIN

## 2018-05-13 LAB — POC POTASSIUM: Lab: 3.6 MMOL/L — ABNORMAL LOW (ref 3.5–5.1)

## 2018-05-13 LAB — IONIZED CALCIUM,BG
Lab: 1.2 MMOL/L (ref 1.0–1.3)
Lab: 1.1 MMOL/L — ABNORMAL HIGH (ref 1.0–1.3)
Lab: 1 MMOL/L (ref 1.0–1.3)
Lab: 1.1 MMOL/L (ref 1.0–1.3)
Lab: 1.1 MMOL/L (ref 1.0–1.3)

## 2018-05-13 LAB — POC SODIUM
Lab: 137 MMOL/L (ref 137–147)
Lab: 140 MMOL/L (ref 137–147)

## 2018-05-13 LAB — MAGNESIUM  CELLULAR THERAPEUTICS: Lab: 2.4 mg/dL — ABNORMAL HIGH (ref 1.6–2.6)

## 2018-05-13 MED ORDER — MAGNESIUM SULFATE IN D5W 1 GRAM/100 ML IV PGBK
1 g | INTRAVENOUS | 0 refills | Status: CP
Start: 2018-05-13 — End: ?
  Administered 2018-05-13 (×3): 1 g via INTRAVENOUS

## 2018-05-13 MED ORDER — DEXTRAN 70-HYPROMELLOSE (PF) 0.1-0.3 % OP DPET
1 [drp] | Freq: Four times a day (QID) | OPHTHALMIC | 0 refills | Status: DC
Start: 2018-05-13 — End: 2018-05-14
  Administered 2018-05-13 – 2018-05-14 (×2): 1 [drp] via OPHTHALMIC

## 2018-05-13 MED ORDER — ARTIFICIAL TEARS(HYPROMELLOSE) 0.5 % OP DROP
1 [drp] | Freq: Four times a day (QID) | OPHTHALMIC | 0 refills | Status: DC
Start: 2018-05-13 — End: 2018-05-18
  Administered 2018-05-14 (×2): 1 [drp] via OPHTHALMIC

## 2018-05-13 MED ORDER — ANTICOAG SODIUM CITRATE SOLN 5ML
1-5 mL | 0 refills | Status: DC | PRN
Start: 2018-05-13 — End: 2018-05-18
  Administered 2018-05-15: 14:00:00 2.8 mL

## 2018-05-13 MED ORDER — DEXTRAN 70-HYPROMELLOSE 0.1-0.3 % OP DROP
1 [drp] | Freq: Four times a day (QID) | OPHTHALMIC | 0 refills | Status: DC
Start: 2018-05-13 — End: 2018-05-13

## 2018-05-13 MED ORDER — SODIUM BICARBONATE 1MEQ/ML INFUSION
500 meq | INTRAVENOUS | 0 refills | Status: DC
Start: 2018-05-13 — End: 2018-05-14
  Administered 2018-05-13 – 2018-05-14 (×2): 500 meq via INTRAVENOUS

## 2018-05-13 MED ORDER — SODIUM BICARBONATE 1MEQ/ML INFUSION
500 meq | INTRAVENOUS | 0 refills | Status: DC
Start: 2018-05-13 — End: 2018-05-13
  Administered 2018-05-13: 08:00:00 500 meq via INTRAVENOUS

## 2018-05-13 MED ORDER — LIDOCAINE BOLUS FOR CONTINUOUS INFUSION
.75 mg/kg | Freq: Once | INTRAVENOUS | 0 refills | Status: CP
Start: 2018-05-13 — End: ?
  Administered 2018-05-13: 07:00:00 94 mg via INTRAVENOUS

## 2018-05-13 MED ORDER — SODIUM BICARBONATE 1 MEQ/ML (8.4 %) IV SOLN
50 meq | Freq: Once | INTRAVENOUS | 0 refills | Status: CP
Start: 2018-05-13 — End: ?
  Administered 2018-05-13: 08:00:00 50 meq via INTRAVENOUS

## 2018-05-13 MED ORDER — VANCOMYCIN RANDOM DOSING
1 | INTRAVENOUS | 0 refills | Status: DC
Start: 2018-05-13 — End: 2018-05-18

## 2018-05-13 MED ORDER — LIDOCAINE IN 5 % DEXTROSE (PF) 8 MG/ML (0.8 %) IV SOLP
1 mg/min | INTRAVENOUS | 0 refills | Status: DC
Start: 2018-05-13 — End: 2018-05-13
  Administered 2018-05-13: 07:00:00 1 mg/min via INTRAVENOUS

## 2018-05-13 MED ORDER — PHENYLEPHRINE 40 MCG/ML IN NS IV DRIP (STD CONC)
0-3 ug/kg/min | INTRAVENOUS | 0 refills | Status: DC
Start: 2018-05-13 — End: 2018-05-13

## 2018-05-13 MED ORDER — CHLORHEXIDINE GLUCONATE 0.12 % MM MWSH
15 mL | Freq: Two times a day (BID) | ORAL | 0 refills | Status: DC
Start: 2018-05-13 — End: 2018-05-18
  Administered 2018-05-13 – 2018-05-18 (×11): 15 mL via ORAL

## 2018-05-13 MED ORDER — ALTEPLASE 2 MG IK SOLR
2 mg | 0 refills | Status: DC | PRN
Start: 2018-05-13 — End: 2018-05-18

## 2018-05-13 MED ADMIN — SODIUM CHLORIDE 0.9 % IV SOLP [27838]: 1000 mL | @ 02:00:00 | Stop: 2018-05-13 | NDC 00338004904

## 2018-05-13 MED ADMIN — AMIODARONE IN DEXTROSE,ISO-OSM 360 MG/200 ML (1.8 MG/ML) IV SOLN [307603]: 1 mg/min | INTRAVENOUS | @ 06:00:00 | Stop: 2018-05-13 | NDC 43066036020

## 2018-05-13 NOTE — Progress Notes
ABG Critical note  PH : 7.129   PCO2 : 72.6   Time notified : 03:40   Dr. Lowell Guitar at bedside.  Repeat ABG in an hour.

## 2018-05-13 NOTE — Progress Notes
MEDSalbuterol 0.5%, 2.5 mg, Inhalation, Q4H & PRN  amitriptyline(#), 50 mg, Oral, QHS  aspirin, 81 mg, Per NG tube, QDAY  atorvastatin, 40 mg, Per NG tube, QDAY  budesonide respule, 0.25 mg, Inhalation, BID  cefepime  ( MAXIPIME ) IVPB, 2 g, Intravenous, Q12H*  chlorhexidine gluconate, 15 mL, Swish & Spit, BID  hydrocortisone PF, 50 mg, Intravenous, Q6H  ipratropium bromide, 0.5 mg, Inhalation, Q4H & PRN  metroNIDAZOLE  (FLAGYL) IVPB, 500 mg, Intravenous, Q8H*  pantoprazole, 40 mg, Intravenous, QDAY  polyethylene glycol 3350, 17 g, Per NG tube, QDAY  vancomycin, random dosing, 1 each, Intravenous, Random Dosing     IV MEDS  ??? amiodarone (CORDARONE) 360 mg in dextrose, iso-osm 200 mL infusion 0.5 mg/min (05/13/18 1207)   ??? anticoagulant citrate dextrose (ACD-A) solution (formula A) 1,000 mL 1,000 mL (05/13/18 1103)   ??? BGK 4/0/1.2 (PRISMASOL) 5,000 mL dialysate 2,800 mL/hr at 05/13/18 1247   ??? BGK 4/0/1.2 (PRISMASOL) 5,000 mL replacement (post-filter) solution 1,000 mL/hr at 05/13/18 1311   ??? calcium chloride 8 g in sodium chloride 0.9% (NS) 1,080 mL IV Infusion 8 g (05/13/18 0923)   ??? EPINEPHrine (ADRENALIN) 16,000 mcg in dextrose 5% (D5W) 250 mL IV drip (quad conc) 0.1 mcg/kg/min (05/13/18 1206)   ??? fentaNYL (SUBLIMAZE)  1000 mcg/100 mL NS IV drip (std conc)(premade) 35 mcg/hr (05/13/18 1140)   ??? insulin regular 100 units/NS 100 mL IV drip (premade) 4 Units/hr (05/13/18 1305)   ??? norepinephrine (LEVOPHED) 16 mg in dextrose 5% (D5W) 250 mL IV drip (quad conc) 0.1 mcg/kg/min (05/13/18 1052)   ??? propofoL (DIPRIVAN) 10 mg/mL IV drip 5 mcg/kg/min (05/13/18 1304)   ??? sodium bicarbonate  1 mEq/mL IV infusion (max conc) 20 mL/hr at 05/13/18 1206   ??? vasopressin (VASOSTRICT) 20 Units in sodium chloride 0.9% (NS) 100 mL IV drip (std conc) 2.4 Units/hr (05/13/18 1206)     Prn albuterol sulfate Q4H PRN, calcium gluconate  IV PRN, [DISCONTINUED] diphenhydrAMINE Q4H PRN **OR** diphenhydrAMINE Q4H PRN, naloxone PRN,

## 2018-05-13 NOTE — Progress Notes
Basophils 0 0 - 2 %    Absolute Neutrophil Count 22.30 (H) 1.8 - 7.0 K/UL    Absolute Lymph Count 0.40 (L) 1.0 - 4.8 K/UL    Absolute Monocyte Count 0.90 (H) 0 - 0.80 K/UL    Absolute Eosinophil Count 0.20 0 - 0.45 K/UL    Absolute Basophil Count 0.10 0 - 0.20 K/UL   LACTIC ACID(LACTATE)    Collection Time: 05/12/18  5:00 PM   Result Value Ref Range    Lactic Acid 1.1 0.5 - 2.0 MMOL/L   PROTIME INR (PT)    Collection Time: 05/12/18  5:00 PM   Result Value Ref Range    INR 5.1 (HH) 0.8 - 1.2   MAGNESIUM    Collection Time: 05/12/18  5:00 PM   Result Value Ref Range    Magnesium 2.3 1.6 - 2.6 mg/dL   POC GLUCOSE    Collection Time: 05/12/18  5:31 PM   Result Value Ref Range    Glucose, POC 257 (H) 70 - 100 MG/DL   POC GLUCOSE    Collection Time: 05/12/18  6:02 PM   Result Value Ref Range    Glucose, POC 261 (H) 70 - 100 MG/DL   POC GLUCOSE    Collection Time: 05/12/18  6:59 PM   Result Value Ref Range    Glucose, POC 261 (H) 70 - 100 MG/DL   POC GLUCOSE    Collection Time: 05/12/18  8:11 PM   Result Value Ref Range    Glucose, POC 230 (H) 70 - 100 MG/DL   POC GLUCOSE    Collection Time: 05/12/18  8:58 PM   Result Value Ref Range    Glucose, POC 245 (H) 70 - 100 MG/DL   BASIC METABOLIC PANEL CELLULAR THERAPEUTICS    Collection Time: 05/12/18  9:35 PM   Result Value Ref Range    Sodium 137 137 - 147 MMOL/L    Potassium 5.2 (H) 3.5 - 5.1 MMOL/L    Chloride 105 98 - 110 MMOL/L    CO2 22 21 - 30 MMOL/L    Anion Gap 10 3 - 12    Glucose 255 (H) 70 - 100 MG/DL    Blood Urea Nitrogen 69 (H) 7 - 25 MG/DL    Creatinine 1.61 (H) 0.4 - 1.00 MG/DL    Calcium 8.5 8.5 - 09.6 MG/DL    eGFR Non African American 17 (L) >60 mL/min    eGFR African American 21 (L) >60 mL/min   MAGNESIUM  CELLULAR THERAPEUTICS    Collection Time: 05/12/18  9:35 PM   Result Value Ref Range    Magnesium 2.4 1.6 - 2.6 mg/dL   PHOSPHORUS  CELLULAR THERAPEUTICS    Collection Time: 05/12/18  9:35 PM   Result Value Ref Range White Blood Cells 20.2 (H) 4.5 - 11.0 K/UL    RBC 3.42 (L) 4.0 - 5.0 M/UL    Hemoglobin 10.5 (L) 12.0 - 15.0 GM/DL    Hematocrit 04.5 (L) 36 - 45 %    MCV 97.8 80 - 100 FL    MCH 30.7 26 - 34 PG    MCHC 31.4 (L) 32.0 - 36.0 G/DL    RDW 40.9 (H) 11 - 15 %    Platelet Count 218 150 - 400 K/UL    MPV 8.1 7 - 11 FL    Neutrophils 92 (H) 41 - 77 %    Lymphocytes 3 (L) 24 - 44 %    Monocytes  5 4 - 12 %    Eosinophils 0 0 - 5 %    Basophils 0 0 - 2 %    Absolute Neutrophil Count 18.60 (H) 1.8 - 7.0 K/UL    Absolute Lymph Count 0.60 (L) 1.0 - 4.8 K/UL    Absolute Monocyte Count 1.00 (H) 0 - 0.80 K/UL    Absolute Eosinophil Count 0.00 0 - 0.45 K/UL    Absolute Basophil Count 0.00 0 - 0.20 K/UL   COMPREHENSIVE METABOLIC PANEL    Collection Time: 05/13/18  4:00 AM   Result Value Ref Range    Sodium 140 137 - 147 MMOL/L    Potassium 3.9 3.5 - 5.1 MMOL/L    Chloride 105 98 - 110 MMOL/L    Glucose 173 (H) 70 - 100 MG/DL    Blood Urea Nitrogen 45 (H) 7 - 25 MG/DL    Creatinine 3.08 (H) 0.4 - 1.00 MG/DL    Calcium 7.9 (L) 8.5 - 10.6 MG/DL    Total Protein 6.6 6.0 - 8.0 G/DL    Total Bilirubin 0.7 0.3 - 1.2 MG/DL    Albumin 3.0 (L) 3.5 - 5.0 G/DL    Alk Phosphatase 657 (H) 25 - 110 U/L    AST (SGOT) 130 (H) 7 - 40 U/L    CO2 21 21 - 30 MMOL/L    ALT (SGPT) 73 (H) 7 - 56 U/L    Anion Gap 14 (H) 3 - 12    eGFR Non African American 27 (L) >60 mL/min    eGFR African American 33 (L) >60 mL/min   MAGNESIUM    Collection Time: 05/13/18  4:00 AM   Result Value Ref Range    Magnesium 1.9 1.6 - 2.6 mg/dL   PROTIME INR (PT)    Collection Time: 05/13/18  4:00 AM   Result Value Ref Range    INR 5.0 (H) 0.8 - 1.2   LACTIC ACID(LACTATE)    Collection Time: 05/13/18  4:00 AM   Result Value Ref Range    Lactic Acid 1.4 0.5 - 2.0 MMOL/L   PHOSPHORUS  CELLULAR THERAPEUTICS    Collection Time: 05/13/18  4:00 AM   Result Value Ref Range    Phosphorus 4.0 2.0 - 4.5 MG/DL   POC GLUCOSE    Collection Time: 05/13/18  5:06 AM   Result Value Ref Range

## 2018-05-13 NOTE — Progress Notes
No vital signs documented for 0200 due to daylight savings time

## 2018-05-13 NOTE — Progress Notes
CRRT ongoing, titrating pressors as needed.  Pt having runs of ventricular tachycardia, Dr. Lowell Guitar at bedside.  Transvenous pacemaker rate increased to 98.

## 2018-05-14 DIAGNOSIS — A419 Sepsis, unspecified organism: ICD-10-CM

## 2018-05-14 LAB — CULTURE-RESP,LOWER W/SENSITIVITY: Lab: LOW

## 2018-05-14 LAB — BLOOD GASES, ARTERIAL
Lab: 0.2 MMOL/L — ABNORMAL HIGH (ref 8.5–10.6)
Lab: 1.8 MMOL/L
Lab: 101 mmHg — ABNORMAL HIGH (ref 80–100)
Lab: 119 mmHg — ABNORMAL HIGH (ref 80–100)
Lab: 126 mmHg — ABNORMAL HIGH (ref 80–100)
Lab: 24 MMOL/L (ref 21–28)
Lab: 26 MMOL/L (ref 21–28)
Lab: 28 MMOL/L — ABNORMAL HIGH (ref 21–28)
Lab: 3.6 MMOL/L
Lab: 4.4 MMOL/L
Lab: 58 mmHg — ABNORMAL HIGH (ref 35–45)
Lab: 59 mmHg — ABNORMAL HIGH (ref 35–45)
Lab: 60 mmHg — ABNORMAL HIGH (ref 35–45)
Lab: 60 mmHg — ABNORMAL HIGH (ref 35–45)
Lab: 61 mmHg — ABNORMAL HIGH (ref 35–45)
Lab: 7.2 mg/dL — ABNORMAL LOW (ref 7.35–7.45)
Lab: 7.2 — ABNORMAL LOW (ref 7.35–7.45)
Lab: 7.3 — ABNORMAL LOW (ref 7.35–7.45)
Lab: 7.3 — ABNORMAL LOW (ref 7.35–7.45)
Lab: 7.3 — ABNORMAL LOW (ref 7.35–7.45)
Lab: 84 mmHg (ref 80–100)
Lab: 95 mmHg (ref 80–100)
Lab: 96 % (ref 95–99)
Lab: 97 % (ref 95–99)
Lab: 97 % (ref 95–99)
Lab: 98 % (ref 95–99)

## 2018-05-14 LAB — IONIZED CALCIUM,BG
Lab: 1 MMOL/L (ref 1.0–1.3)
Lab: 1 MMOL/L (ref 1.0–1.3)
Lab: 1 MMOL/L (ref 1.0–1.3)

## 2018-05-14 LAB — BASIC METABOLIC PANEL CELLULAR THERAPEUTICS
Lab: 1 mg/dL — ABNORMAL HIGH (ref 0.4–1.00)
Lab: 103 MMOL/L (ref 98–110)
Lab: 141 MMOL/L (ref 137–147)
Lab: 231 mg/dL — ABNORMAL HIGH (ref 70–100)
Lab: 27 MMOL/L (ref 21–30)
Lab: 3.9 MMOL/L (ref 3.5–5.1)
Lab: 51 mL/min — ABNORMAL LOW (ref >60)
Lab: >60 mL/min (ref >60)
Lab: 0.9 mg/dL (ref 0.4–1.00)
Lab: 101 MMOL/L (ref 98–110)
Lab: 11 (ref 3–12)
Lab: 139 MMOL/L (ref 137–147)
Lab: 17 mg/dL (ref 7–25)
Lab: 193 mg/dL — ABNORMAL HIGH (ref 70–100)
Lab: 56 mL/min — ABNORMAL LOW (ref >60)
Lab: 8.3 mg/dL — ABNORMAL LOW (ref 8.5–10.6)
Lab: >60 mL/min (ref >60)

## 2018-05-14 LAB — POC GLUCOSE
Lab: 100 mg/dL (ref 70–100)
Lab: 116 mg/dL — ABNORMAL HIGH (ref 70–100)
Lab: 121 mg/dL — ABNORMAL HIGH (ref 70–100)
Lab: 123 mg/dL — ABNORMAL HIGH (ref 70–100)
Lab: 124 mg/dL — ABNORMAL HIGH (ref 70–100)
Lab: 128 mg/dL — ABNORMAL HIGH (ref 70–100)
Lab: 132 mg/dL — ABNORMAL HIGH (ref 70–100)
Lab: 133 mg/dL — ABNORMAL HIGH (ref 70–100)
Lab: 136 mg/dL — ABNORMAL HIGH (ref >60)
Lab: 139 mg/dL — ABNORMAL HIGH (ref 70–100)
Lab: 145 mg/dL — ABNORMAL HIGH (ref >60)
Lab: 150 mg/dL — ABNORMAL HIGH (ref 70–100)
Lab: 155 mg/dL — ABNORMAL HIGH (ref 70–100)
Lab: 159 mg/dL — ABNORMAL HIGH (ref 70–100)
Lab: 164 mg/dL — ABNORMAL HIGH (ref 70–100)
Lab: 174 mg/dL — ABNORMAL HIGH (ref 70–100)
Lab: 190 mg/dL — ABNORMAL HIGH (ref 70–100)
Lab: 191 mg/dL — ABNORMAL HIGH (ref 70–100)
Lab: 196 mg/dL — ABNORMAL HIGH (ref 70–100)
Lab: 200 mg/dL — ABNORMAL HIGH (ref 70–100)

## 2018-05-14 LAB — CELL COUNT W/DIFF-FLUIDS
Lab: 4 %
Lab: 820 /uL
Lab: 96 %

## 2018-05-14 LAB — PHOSPHORUS  CELLULAR THERAPEUTICS
Lab: 2.8 mg/dL (ref 2.0–4.5)
Lab: 2.1 mg/dL (ref 2.0–4.5)

## 2018-05-14 LAB — IONIZED CALCIUM, POST FILTER
Lab: 0.4 MMOL/L
Lab: 0.4 MMOL/L
Lab: 0.4 MMOL/L

## 2018-05-14 LAB — MAGNESIUM  CELLULAR THERAPEUTICS
Lab: 2.2 mg/dL (ref 1.6–2.6)
Lab: 1.7 mg/dL (ref 1.6–2.6)

## 2018-05-14 LAB — PROTIME INR (PT): Lab: 2 — ABNORMAL HIGH (ref 0.8–1.2)

## 2018-05-14 LAB — COMPREHENSIVE METABOLIC PANEL: Lab: 141 MMOL/L — ABNORMAL LOW (ref 137–147)

## 2018-05-14 MED ORDER — PHYTONADIONE IVPB
3 mg | Freq: Once | INTRAVENOUS | 0 refills | Status: CP
Start: 2018-05-14 — End: ?
  Administered 2018-05-14 (×2): 3 mg via INTRAVENOUS

## 2018-05-14 MED ORDER — PANTOPRAZOLE 40 MG IV SOLR
40 mg | Freq: Two times a day (BID) | INTRAVENOUS | 0 refills | Status: DC
Start: 2018-05-14 — End: 2018-05-18
  Administered 2018-05-15 – 2018-05-18 (×7): 40 mg via INTRAVENOUS

## 2018-05-14 MED ORDER — VANCOMYCIN 1,750 MG IVPB
15 mg/kg | Freq: Once | INTRAVENOUS | 0 refills | Status: CP
Start: 2018-05-14 — End: ?
  Administered 2018-05-15 (×2): 1750 mg via INTRAVENOUS

## 2018-05-14 MED ORDER — PANCRELIPASE-SODIUM BICARBONATE 20,880 K UNIT-650 MG
NASOGASTRIC | 0 refills | Status: DC | PRN
Start: 2018-05-14 — End: 2018-05-18

## 2018-05-14 MED ORDER — HYDROCORTISONE SOD SUCC (PF) 100 MG/2 ML IJ SOLR
50 mg | Freq: Two times a day (BID) | INTRAVENOUS | 0 refills | Status: DC
Start: 2018-05-14 — End: 2018-05-15
  Administered 2018-05-15 (×2): 50 mg via INTRAVENOUS

## 2018-05-14 MED ORDER — VANCOMYCIN 1,250 MG IVPB
1250 mg | Freq: Once | INTRAVENOUS | 0 refills | Status: CP
Start: 2018-05-14 — End: ?
  Administered 2018-05-14 (×2): 1250 mg via INTRAVENOUS

## 2018-05-14 NOTE — Progress Notes
1330- Discussed most recent labs and gtt doses with Dr. Ulis Rias. Continue pulling Net 200 for CRRT. Titrate Epi off first if able and then titrate norepi off if able. MAP goal remains 70. Orders to be placed. Will continue to monitor.     1610- Discussed with Dr. Ulis Rias borderline MAP and decided to decrease pull to Net 50 (within order parameters). Epi remains at 0.06.     1845- Discussed most recent KUB with night HF Team. OG retracted d/t advancement during corpak placement. Orders to be placed to be able to use.

## 2018-05-14 NOTE — Consults
Note being placed to sign-off the consult order.  Wound Team has been following pt for her wound VAC.    Lora Havens, RN, BSN, [employer], [employer]  Wound Ostomy Nursing Consult Service  Office: [phone]  Pager: [phone]  After Hours Wound/Ostomy Team Pager: [phone]

## 2018-05-14 NOTE — Progress Notes
Teaching Physician Attestation:  I have personally interviewed and examined the patient, have reviewed the documentation, and agree with the assessment and plan of the resident.  She remains critically ill despite maximum support.  She remains on the ventilator with evidence of diffuse pulmonary infiltrates.  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  She has upper GI bleeding her INR is 5.5.  She will receive fresh frozen plasma and some vitamin K.  Her echocardiogram suggest stable aortic valve status with normal LV systolic function.  The creatinine is down to 0.96.  She remains on CRRT therapy.  There is been no significant urine output.  She has been too unstable to transfer for CT imaging.  Would like to proceed with that as soon as it is felt safe to move her to the x-ray department.  She is critically ill.  I spent more than 45 minutes coordinating her care.  She will need NG feeding if possible to help with her nutrition.  Reubin Milan, MD

## 2018-05-14 NOTE — Consults
CLINICAL NUTRITION                                                        Clinical Nutrition Initial Assessment    Name: Michelle Solis            MRN: [id number]                DOB: 1940/09/03          Age: 78 y.o.  Admission Date: 06/03/2018             LOS: 3 days        Recommendation:  ??? Given current pressor requirement, recommend post-pyloric corpak placement to initiate trickle feeds of Nutren 2.0 at 10-95ml/hr.   ??? Pts goal rate at this time with being of CRRT and intubated would be Nutren 2.0 at 30ml/hr with 3 packets of prosource liquid protein to provide 2340 kcal, 135 gm protein, and 745 ml free fluid. Additional fluids per primary team.   ??? Given multiple factors influencing energy needs and formula selection (CRRT, Propofol rate, pressor requirement, and intubation), RD will follow pt closely and adjust nutrition recommendations.     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  Pt intubated on 3/7 requiring 3 pressors and was started on CRRT. Wound team following pt given left abd abscess and wound vac. RD consulted for TF recommendations. Pt currently requiring vasopressin, levophed, Epi, Propofol (24hr avg 208kcal), and fentanyl. Pt with coffee ground suction output. Pt OGT placed to [facility] 3/9. Pt with BLE; BUE non-pitting edema and -4.4L net i/o since admit. Noted goal dry body weight documented as 270#. +BM 3/6. RD will continue to monitor pt.    Nutrition Assessment of Patient:  Admit Weight: 125.9 kg;  ; Desired Weight: 67.8 kg  BMI (Calculated): 44.76; BMI Categories Adult: Obesity Class III: 40 and over; Appearance: Other (Comment)(Pt intubated/sedated)

## 2018-05-14 NOTE — Case Management (ED)
Case Management Progress Note    NAME:Michelle Solis                          MRN: [id number]              DOB:10/25/1940          AGE: 78 y.o.  ADMISSION DATE: 05/31/2018             DAYS ADMITTED: LOS: 3 days      Today???s Date: 05/14/2018    Plan  Discharge planning is ongoing; continue inpatient admission in CICU.  SW/NCM will remain available and continue to follow.     Interventions  ? Support       SW discussed patient's plan of care/discharge needs with NCM and HF team in HF huddle.  Per HF attending, patient is critically ill.  Per chart review, Renal and Nephrology were consulted this admission and are following patient.  Patient is on CRRT and broad spectrum coverage with vancomycin, cefepime, and flagyl.  Plan is to wean pressors and RR as tolerated and obtain TEE and CT abdomen once more stable.    SW met with patient's family including her daughter, Elita Quick, son, Annette Stable, and a granddaughter at bedside.  Patient was unable to participate in visit due to clinical status and intubation.  SW introduced self and provided contact information.  No specific needs were identified during brief SW visit.  SW encouraged family to reach out should any needs arise.  SW/NCM will plan to complete Case Management assessment once patient is more appropriate.    ? Info or Referral      ? Discharge Planning      ? Medication Needs      o Financial      ? Legal      ? Other        Disposition  ? Expected Discharge Date    Expected Discharge Date: 05/22/18  Expected Discharge Time: 1600  ? Transportation      ? Next Level of Care (Acute Psych discharges only)      ? Discharge Disposition          Windle Guard, LMSW  Inpatient Heart Failure Social Worker  Phone:  [phone]  Pager:  [phone]

## 2018-05-14 NOTE — Progress Notes
???   propofoL (DIPRIVAN) 10 mg/mL IV drip 15 mcg/kg/min (05/14/18 1430)   ??? vasopressin (VASOSTRICT) 20 Units in sodium chloride 0.9% (NS) 100 mL IV drip (std conc) 2.4 Units/hr (05/14/18 1316)     Prn albuterol sulfate Q4H PRN, alteplase PRN (On Call from Rx), anticoagulant sodium citrate PRN (On Call from Rx), calcium gluconate  IV PRN, [DISCONTINUED] diphenhydrAMINE Q4H PRN **OR** diphenhydrAMINE Q4H PRN, pancrelipase/sodium bicarbonate 20,880 k unit/650 mg PRN (On Call from Rx), naloxone PRN, ondansetron (ZOFRAN) IV Q6H PRN, sodium chloride 0.9% (NS) PRN 1,000 mL at 05/13/18 2307, sodium phosphate  IVPB PRN (On Call from Rx) **OR** sodium phosphate  IVPB PRN (On Call from Rx) **OR** sodium phosphate  IVPB PRN (On Call from Rx), [DISCONTINUED] vancomycin  (0-40 kg) IV Q12H* **AND** vancomycin, pharmacy to manage Per Pharmacy       Physical Exam        Vital Signs: Last Filed In 24 Hours Vital Signs: 24 Hour Range   BP: 112/56 (03/09 1101)  Temp: 36.3 ???C (97.3 ???F) (03/09 1400)  Pulse: 103 (03/09 1300)  Respirations: 32 PER MINUTE (03/09 1300)  SpO2: 100 % (03/09 1300)  SpO2 Pulse: 100 (03/09 1130)  Height: 165.1 cm (65) (03/09 0932) BP: (112-163)/(56-75)   ABP: (91-136)/(53-65)   Temp:  [34.8 ???C (94.6 ???F)-36.9 ???C (98.4 ???F)]   Pulse:  [90-126]   Respirations:  [19 PER MINUTE-36 PER MINUTE]   SpO2:  [96 %-100 %]           Vitals:    05/13/18 0600 05/14/18 0600 05/14/18 0932   Weight: 125.7 kg (277 lb 1.9 oz) 122.6 kg (270 lb 4.5 oz) 122 kg (269 lb)       Constitutional: Appears very ill, elderly and ill, intubated and sedated. No distress.   Head: Normocephalic and atraumatic.   Mouth/Throat: No oropharyngeal exudate.   Eyes: No scleral icterus or injection.   Neck: No JVD present. No tracheal deviation present.   Cardiovascular: Normal rate, regular rhythm, normal heart sounds and intact distal pulses.  No murmur.   Pulmonary/Chest: Effort normal. No wheezes and no rales.

## 2018-05-14 NOTE — Progress Notes
Wound Length (cm) 0.5 cm 05-26-18  2:00 PM   Wound Width (cm) 2.8 cm 05-26-18  2:00 PM   Wound Depth (cm) 8 cm 2018-05-26  2:00 PM   Wound Surface Area (cm^2) 1.4 cm^2 May 26, 2018  2:00 PM   Wound Volume (cm^3) 11.2 cm^3 May 26, 2018  2:00 PM   Number of days: 3     Procedure: Negative Wound Therapy Dressing Change  ???  Anesthesia Type: Not Applicable or None  ???  Provider(s) and Role: RN  ???  Negative Wound Pressure Device Type: KCI VAC  ???  Contact layer: NA  ???  Number of sponges in the wound prior to dressing change: 1  ???  Number of sponges removed from the wound: 1  ???  Type of sponge: Black foam  ???  Number of sponges placed in the wound: 1  ???  Red, moist tissue at the opening.  Wound irrigated with saline, black VAC foam applied.  Dressing connected to wound VAC with settings of - continuous.  Next dressing change planned for Wed 3/11.  ???  Will continue to follow.  ???  Lora Havens, RN, BSN, [employer], [employer]  Wound Ostomy Nursing Consult Service  Office: [phone]  Pager: [phone]  After Hours Wound/Ostomy Team Pager: [phone]

## 2018-05-14 NOTE — Progress Notes
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 Broadus John (surgery) in [place] since  -03/2018 - Debridement of LLQ infection by Dr. Broadus John with wound vac placement on chronic PO antibiotic therapy per daughter    Hx of cerebral aneurysm and SAH, s/p coiling and VP shunt placement (1999)    COPD  -On 2L at night and intermittently with exertion at baseline    Chronic lymphopenia  -Unclear etiology, dating back to Dec 2018    Antibiotic allergies/intolerance  Per [facility] records:  Azithromycin - tongue swelling  Ciprofloxacin: rash, flushing of face/neck  Moxifloxacin - dyspnea  Sulfa -rash    Recommendations:     Unclear source of Corynebacterium bacteremia. Concern for endovascular involvement in setting of TAVR and VP shunt infection.    1. Continue vancomycin, cefepime and metronidazole  2. Follow up BAL cultures and cytology  3. Follow up blood cultures  4. Please repeat one set of blood cultures today  5. When feasible/patient more stable, would recommend TEE for evaluation for evidence of endocarditis  6. When patient more stable, would recommend CT abd/pelvis to evaluate LLQ wound and look for recurrent abdominal abscesses or evidence of VP shunt infection that may be contributing to current sepsis presentation.     We'll follow    The patient is critically ill with respiratory failure requiring mechanical ventilation, cardiac arrest, shock requiring presors. I spent 35 minutes reviewing the patient's labs, imaging studies, and clinical

## 2018-05-14 NOTE — Progress Notes
CRRT ongoing, levophed titrated to maintain MAP of 75-80

## 2018-05-15 ENCOUNTER — Encounter: Admit: 2018-05-15 | Discharge: 2018-05-15 | Payer: MEDICARE

## 2018-05-15 DIAGNOSIS — A419 Sepsis, unspecified organism: ICD-10-CM

## 2018-05-15 LAB — POC GLUCOSE
Lab: 121 mg/dL — ABNORMAL HIGH (ref 70–100)
Lab: 130 mg/dL — ABNORMAL HIGH (ref 70–100)
Lab: 156 mg/dL — ABNORMAL HIGH (ref 70–100)
Lab: 157 mg/dL — ABNORMAL HIGH (ref 70–100)
Lab: 156 mg/dL — ABNORMAL HIGH (ref 70–100)
Lab: 166 mg/dL — ABNORMAL HIGH (ref 70–100)
Lab: 145 mg/dL — ABNORMAL HIGH (ref 70–100)
Lab: 140 mg/dL — ABNORMAL HIGH (ref 70–100)
Lab: 136 mg/dL — ABNORMAL HIGH (ref 70–100)
Lab: 152 mg/dL — ABNORMAL HIGH (ref 70–100)
Lab: 148 mg/dL — ABNORMAL HIGH (ref 70–100)
Lab: 141 mg/dL — ABNORMAL HIGH (ref 70–100)
Lab: 142 mg/dL — ABNORMAL HIGH (ref 70–100)
Lab: 149 mg/dL — ABNORMAL HIGH (ref 70–100)
Lab: 144 mg/dL — ABNORMAL HIGH (ref 70–100)
Lab: 155 mg/dL — ABNORMAL HIGH (ref 70–100)

## 2018-05-15 LAB — BASIC METABOLIC PANEL CELLULAR THERAPEUTICS
Lab: 0.9 mg/dL (ref 0.4–1.00)
Lab: 136 MMOL/L — ABNORMAL LOW (ref >40)
Lab: 8.2 mg/dL — ABNORMAL LOW (ref 8.5–10.6)

## 2018-05-15 LAB — BLOOD GASES, ARTERIAL
Lab: 56 mmHg — ABNORMAL HIGH (ref 35–45)
Lab: 7.3 — ABNORMAL LOW (ref 7.35–7.45)
Lab: 25 MMOL/L (ref 21–28)
Lab: 51 mmHg — ABNORMAL HIGH (ref 35–45)

## 2018-05-15 LAB — MAGNESIUM  CELLULAR THERAPEUTICS
Lab: 1.6 mg/dL — ABNORMAL HIGH (ref <100)
Lab: 1.6 mg/dL (ref >60)

## 2018-05-15 LAB — PHOSPHORUS  CELLULAR THERAPEUTICS: Lab: 1.6 mg/dL — ABNORMAL LOW (ref 2.0–4.5)

## 2018-05-15 LAB — IONIZED CALCIUM,BG
Lab: 1.1 MMOL/L (ref 1.0–1.3)
Lab: 1.1 MMOL/L (ref 1.0–1.3)
Lab: 1.2 MMOL/L (ref 1.0–1.3)

## 2018-05-15 LAB — IONIZED CALCIUM, POST FILTER
Lab: 0.5 MMOL/L
Lab: 0.4 MMOL/L

## 2018-05-15 LAB — POC ACTIVATED CLOTTING TIME: Lab: 154 s

## 2018-05-15 MED ORDER — VANCOMYCIN 1,750 MG IVPB
15 mg/kg | Freq: Once | INTRAVENOUS | 0 refills | Status: CP
Start: 2018-05-15 — End: ?
  Administered 2018-05-16 (×2): 1750 mg via INTRAVENOUS

## 2018-05-15 MED ORDER — POLYETHYLENE GLYCOL 3350 17 GRAM PO PWPK
2 | Freq: Two times a day (BID) | ORAL | 0 refills | Status: DC
Start: 2018-05-15 — End: 2018-05-18
  Administered 2018-05-15 – 2018-05-18 (×6): 34 g via ORAL

## 2018-05-15 MED ORDER — METHYLNALTREXONE 12 MG/0.6 ML SC SOLN
12 mg | Freq: Once | SUBCUTANEOUS | 0 refills | Status: CP
Start: 2018-05-15 — End: ?
  Administered 2018-05-15: 23:00:00 12 mg via SUBCUTANEOUS

## 2018-05-15 MED ORDER — HEPARIN (PORCINE) 10,000 UNIT/ML IJ SOLN (IMS)
7500 [IU] | SUBCUTANEOUS | 0 refills | Status: DC
Start: 2018-05-15 — End: 2018-05-15

## 2018-05-15 MED ORDER — HEPARIN (PORCINE) 10,000 UNIT/ML IJ SOLN (IMS)
10000 [IU] | Freq: Two times a day (BID) | SUBCUTANEOUS | 0 refills | Status: DC
Start: 2018-05-15 — End: 2018-05-16
  Administered 2018-05-16 (×2): 10000 [IU] via SUBCUTANEOUS

## 2018-05-15 MED ORDER — IOHEXOL 350 MG IODINE/ML IV SOLN
80 mL | Freq: Once | INTRAVENOUS | 0 refills | Status: CP
Start: 2018-05-15 — End: ?
  Administered 2018-05-15: 06:00:00 80 mL via INTRAVENOUS

## 2018-05-15 MED ORDER — METOCLOPRAMIDE HCL 5 MG/ML IJ SOLN
10 mg | INTRAVENOUS | 0 refills | Status: CP
Start: 2018-05-15 — End: ?
  Administered 2018-05-15 – 2018-05-16 (×4): 10 mg via INTRAVENOUS

## 2018-05-15 MED ORDER — SODIUM CHLORIDE 0.9 % IJ SOLN
50 mL | Freq: Once | INTRAVENOUS | 0 refills | Status: CP
Start: 2018-05-15 — End: ?
  Administered 2018-05-15: 16:00:00 50 mL via INTRAVENOUS

## 2018-05-15 NOTE — Progress Notes
Sodium 141 137 - 147 MMOL/L    Potassium 4.2 3.5 - 5.1 MMOL/L    Chloride 102 98 - 110 MMOL/L    CO2 27 21 - 30 MMOL/L    Anion Gap 12 3 - 12    Glucose 206 (H) 70 - 100 MG/DL    Blood Urea Nitrogen 19 7 - 25 MG/DL    Creatinine 0.45 0.4 - 1.00 MG/DL    Calcium 8.1 (L) 8.5 - 10.6 MG/DL    eGFR Non African American 55 (L) >60 mL/min    eGFR African American >60 >60 mL/min   MAGNESIUM  CELLULAR THERAPEUTICS    Collection Time: 05/14/18  9:25 AM   Result Value Ref Range    Magnesium 1.7 1.6 - 2.6 mg/dL   PHOSPHORUS  CELLULAR THERAPEUTICS    Collection Time: 05/14/18  9:25 AM   Result Value Ref Range    Phosphorus 2.1 2.0 - 4.5 MG/DL   TEG WITH KAOLIN & HEPARINASE    Collection Time: 05/14/18  9:25 AM   Result Value Ref Range    MA Kaolin with Heparinase 74.8 >49.9 MM    R Kaolin with Heparniase 9.3 (H) <9.1 MIN    K Kaolin with Heparninase 1.5 <3.1 MIN    RK Kaolin with Heparinase 10.8 <12.1 MIN    Angle Kaolin with Heparinase 70.0 >54.9 DEG    Lysis30 Kaolin with Heparinase 0.0 <8.1 %   TYPE & CROSSMATCH    Collection Time: 05/14/18  9:25 AM   Result Value Ref Range    Units Ordered 0     Crossmatch Expires 05/17/2018     Record Check FOUND     ABO/RH(D) O POS     Antibody Screen NEG     Electronic Crossmatch YES    IONIZED CALCIUM, POST FILTER    Collection Time: 05/14/18  9:25 AM   Result Value Ref Range    Ionized Calcium, Post Filter 0.41 MMOL/L   IONIZED CALCIUM,BG    Collection Time: 05/14/18  9:25 AM   Result Value Ref Range    Ionized Calcium 1.06 1.0 - 1.3 MMOL/L   BLOOD GASES, ARTERIAL    Collection Time: 05/14/18  9:25 AM   Result Value Ref Range    pH-Arterial 7.32 (L) 7.35 - 7.45    pCO2-Arterial 60 (H) 35 - 45 MMHG    pO2-Arterial 101 (H) 80 - 100 MMHG    Base Excess-Arterial 3.6 MMOL/L    O2 Sat-Arterial 97.0 95 - 99 %    Bicarbonate-ART-Cal 27.6 21 - 28 MMOL/L   FIBRINOGEN    Collection Time: 05/14/18  9:25 AM   Result Value Ref Range    Fibrinogen 650 (H) 200 - 400 MG/DL   HEMOGLOBIN & HEMATOCRIT Collection Time: 05/14/18  9:25 AM   Result Value Ref Range    Hemoglobin 10.0 (L) 12.0 - 15.0 GM/DL    Hematocrit 40.9 (L) 36 - 45 %   PTT (APTT)    Collection Time: 05/14/18  9:25 AM   Result Value Ref Range    APTT 39.8 (H) 24.0 - 36.5 SEC   POC GLUCOSE    Collection Time: 05/14/18  9:55 AM   Result Value Ref Range    Glucose, POC 100 70 - 100 MG/DL   POC GLUCOSE    Collection Time: 05/14/18 10:54 AM   Result Value Ref Range    Glucose, POC 200 (H) 70 - 100 MG/DL   POC GLUCOSE  Collection Time: 05/14/18 11:57 AM   Result Value Ref Range    Glucose, POC 196 (H) 70 - 100 MG/DL   POC GLUCOSE    Collection Time: 05/14/18 12:57 PM   Result Value Ref Range    Glucose, POC 197 (H) 70 - 100 MG/DL   BLOOD GASES, ARTERIAL    Collection Time: 05/14/18  1:00 PM   Result Value Ref Range    pH-Arterial 7.28 (L) 7.35 - 7.45    pCO2-Arterial 60 (H) 35 - 45 MMHG    pO2-Arterial 119 (H) 80 - 100 MMHG    Base Excess-Arterial 0.2 MMOL/L    O2 Sat-Arterial 98.4 95 - 99 %    Bicarbonate-ART-Cal 24.6 21 - 28 MMOL/L   POC GLUCOSE    Collection Time: 05/14/18  1:56 PM   Result Value Ref Range    Glucose, POC 191 (H) 70 - 100 MG/DL   POC GLUCOSE    Collection Time: 05/14/18  3:03 PM   Result Value Ref Range    Glucose, POC 174 (H) 70 - 100 MG/DL   PROTIME INR (PT)    Collection Time: 05/14/18  3:25 PM   Result Value Ref Range    INR 2.0 (H) 0.8 - 1.2   BASIC METABOLIC PANEL CELLULAR THERAPEUTICS    Collection Time: 05/14/18  3:25 PM   Result Value Ref Range    Sodium 139 137 - 147 MMOL/L    Potassium 4.0 3.5 - 5.1 MMOL/L    Chloride 101 98 - 110 MMOL/L    CO2 27 21 - 30 MMOL/L    Anion Gap 11 3 - 12    Glucose 193 (H) 70 - 100 MG/DL    Blood Urea Nitrogen 17 7 - 25 MG/DL    Creatinine 4.78 0.4 - 1.00 MG/DL    Calcium 8.3 (L) 8.5 - 10.6 MG/DL    eGFR Non African American 56 (L) >60 mL/min    eGFR African American >60 >60 mL/min   MAGNESIUM  CELLULAR THERAPEUTICS    Collection Time: 05/14/18  3:25 PM   Result Value Ref Range Report Status     POC GLUCOSE    Collection Time: 05/14/18 10:01 PM   Result Value Ref Range    Glucose, POC 121 (H) 70 - 100 MG/DL   POC GLUCOSE    Collection Time: 05/14/18 11:02 PM   Result Value Ref Range    Glucose, POC 130 (H) 70 - 100 MG/DL   BASIC METABOLIC PANEL CELLULAR THERAPEUTICS    Collection Time: 05/14/18 11:55 PM   Result Value Ref Range    Sodium 136 (L) 137 - 147 MMOL/L    Potassium 4.0 3.5 - 5.1 MMOL/L    Chloride 101 98 - 110 MMOL/L    CO2 27 21 - 30 MMOL/L    Anion Gap 8 3 - 12    Glucose 164 (H) 70 - 100 MG/DL    Blood Urea Nitrogen 14 7 - 25 MG/DL    Creatinine 2.95 0.4 - 1.00 MG/DL    Calcium 8.2 (L) 8.5 - 10.6 MG/DL    eGFR Non African American >60 >60 mL/min    eGFR African American >60 >60 mL/min   MAGNESIUM  CELLULAR THERAPEUTICS    Collection Time: 05/14/18 11:55 PM   Result Value Ref Range    Magnesium 1.6 1.6 - 2.6 mg/dL   PHOSPHORUS  CELLULAR THERAPEUTICS    Collection Time: 05/14/18 11:55 PM   Result Value Ref Range  Phosphorus 1.8 (L) 2.0 - 4.5 MG/DL   IONIZED CALCIUM,BG    Collection Time: 05/14/18 11:55 PM   Result Value Ref Range    Ionized Calcium 1.13 1.0 - 1.3 MMOL/L   IONIZED CALCIUM, POST FILTER    Collection Time: 05/14/18 11:55 PM   Result Value Ref Range    Ionized Calcium, Post Filter 0.51 MMOL/L   POC GLUCOSE    Collection Time: 05/15/18 12:03 AM   Result Value Ref Range    Glucose, POC 145 (H) 70 - 100 MG/DL   POC GLUCOSE    Collection Time: 05/15/18  1:09 AM   Result Value Ref Range    Glucose, POC 156 (H) 70 - 100 MG/DL   POC GLUCOSE    Collection Time: 05/15/18  2:17 AM   Result Value Ref Range    Glucose, POC 157 (H) 70 - 100 MG/DL   POC GLUCOSE    Collection Time: 05/15/18  3:26 AM   Result Value Ref Range    Glucose, POC 156 (H) 70 - 100 MG/DL   POC GLUCOSE    Collection Time: 05/15/18  3:56 AM   Result Value Ref Range    Glucose, POC 166 (H) 70 - 100 MG/DL   PROTIME INR (PT)    Collection Time: 05/15/18  4:00 AM   Result Value Ref Range INR 1.6 (H) 0.8 - 1.2   BLOOD GASES, ARTERIAL    Collection Time: 05/15/18  4:00 AM   Result Value Ref Range    pH-Arterial 7.33 (L) 7.35 - 7.45    pCO2-Arterial 54 (H) 35 - 45 MMHG    pO2-Arterial 85 80 - 100 MMHG    Base Excess-Arterial 1.5 MMOL/L    O2 Sat-Arterial 96.1 95 - 99 %    Bicarbonate-ART-Cal 25.8 21 - 28 MMOL/L   CBC AND DIFF    Collection Time: 05/15/18  4:00 AM   Result Value Ref Range    White Blood Cells 22.4 (H) 4.5 - 11.0 K/UL    RBC 3.01 (L) 4.0 - 5.0 M/UL    Hemoglobin 9.3 (L) 12.0 - 15.0 GM/DL    Hematocrit 16.1 (L) 36 - 45 %    MCV 95.2 80 - 100 FL    MCH 30.8 26 - 34 PG    MCHC 32.4 32.0 - 36.0 G/DL    RDW 09.6 (H) 11 - 15 %    Platelet Count 224 150 - 400 K/UL    MPV 7.6 7 - 11 FL    Neutrophils 91 (H) 41 - 77 %    Lymphocytes 3 (L) 24 - 44 %    Monocytes 6 4 - 12 %    Eosinophils 0 0 - 5 %    Basophils 0 0 - 2 %    Absolute Neutrophil Count 20.50 (H) 1.8 - 7.0 K/UL    Absolute Lymph Count 0.70 (L) 1.0 - 4.8 K/UL    Absolute Monocyte Count 1.20 (H) 0 - 0.80 K/UL    Absolute Eosinophil Count 0.00 0 - 0.45 K/UL    Absolute Basophil Count 0.00 0 - 0.20 K/UL   COMPREHENSIVE METABOLIC PANEL    Collection Time: 05/15/18  4:00 AM   Result Value Ref Range    Sodium 138 137 - 147 MMOL/L    Potassium 4.3 3.5 - 5.1 MMOL/L    Chloride 102 98 - 110 MMOL/L    Glucose 193 (H) 70 - 100 MG/DL    Blood Urea Nitrogen 13  7 - 25 MG/DL    Creatinine 1.61 0.4 - 1.00 MG/DL    Calcium 8.8 8.5 - 09.6 MG/DL    Total Protein 6.7 6.0 - 8.0 G/DL    Total Bilirubin 1.1 0.3 - 1.2 MG/DL    Albumin 3.1 (L) 3.5 - 5.0 G/DL    Alk Phosphatase 045 25 - 110 U/L    AST (SGOT) 65 (H) 7 - 40 U/L    CO2 25 21 - 30 MMOL/L    ALT (SGPT) 61 (H) 7 - 56 U/L    Anion Gap 11 3 - 12    eGFR Non African American 60 (L) >60 mL/min    eGFR African American >60 >60 mL/min   MAGNESIUM    Collection Time: 05/15/18  4:00 AM   Result Value Ref Range    Magnesium 1.6 1.6 - 2.6 mg/dL   POC GLUCOSE    Collection Time: 05/15/18  5:14 AM

## 2018-05-15 NOTE — Progress Notes
Infectious Disease Progress Note    Today's Date:  05/15/2018  Admission Date: 2018/06/03    Reason for this consultation: GPR bacteremia, Corynebacterium on OSH cultures, assist with evaluation and management  Consult type: Co-Management w/Signed Orders    Assessment:     Shock - likely multifactorial, but septic component in setting of Corynebacterium striatum bacteremia identified on separate blood culture sets  Corynebacterium striatum bacteremia  Acute on chronic hypoxic respiratory failure with ARDS  PEA arrest (05/12/18)  -Admit [place] on 3/5 with worsening dyspnea x4 days, weakness, malaise.  Hypoxic on arrival  -3/5 BC x2 ([place]) - Corynebacterium species aerobic and anaerobic bottles in 1/2 sets  -Progressive leukocytosis, respiratory failure, and shock leading to transfer to [place] on 3/6  -3/6 [employer] admit: A fib w/ HR 130s, requiring BIPAP, initially weaned off pressors, WBC 20.0 (86% PMNs), lactate normal, procal 1.72  -3/6 CXR: bilateral mixed interstitial and alveolar pulmonary opacities - possible pneumonia, pulmonary edema, or ARDS.  -3/6 BC x 2 + Corynebacterium striatum group in 2/2 sets  -3/6 rapid Flu A/B and RSV PCR negative  -3/6 TTE: Poor study, EF 55%, well seated TAVR with no regurg, mild MAC w/out stenosis, trace MR  -3/7 AM - intubated, started on 3 pressors  -3/7 BC x 1 NTD  -3/7 bronchoscopy: thick mucopurulent secretions throughout bilateral airways, R>L; BAL of RLL performed.  BAL fluid: 820 RBC, 1470 WBC, 96% PMNs.  RVP negative. cx negative. Cytology: Pending   -3/9 BC x 2 NTD  -3/10 CT chest, abdomen, pelvis w/: Diffuse bilateral pulmonary opacities. No abscess or acute inflammatory process within the abdomen or pelvis. Moderate distention of the proximal colon with retained fecal material, which may represent colonic pseudoobstruction or constipation. No significant change in left inguinal fluid collection, likely postoperative seroma.    A fib with RVR The patient is critically ill with respiratory failure requiring mechanical ventilation, cardiac arrest, shock requiring presors. I spent 35 minutes reviewing the patient's labs, imaging studies, and clinical status, examining the patient and providing recommendations regarding management of this critically ill patient.    __________________________________________________________________________________    Subjective/Interval History     Patient remains intubated and ventilated  Vent: V/AC, FiO2 40%, PEEP 8  Tracheal secretions cloudy, thin, small  On CRRT; pulling off net 50 mL/h, to be changed to 0  Pressors: epi, norepi, and vasopressin  Sedated with propofol and fentanyl  Opens eyes spontaneously but does not follow commands or answer questions  No bowel movements yet-enemas to be started    Wbc 22.4  Hb 9.3  Plt 224  Cr 0.88  AST 65  ALT 61  ALP 105  T bili nl    Discussed with the patient's son and Dr. Ulis Rias.    Antimicrobial Start date End date   Ceftriaxone 3/6 3/6   Cefepime 3/6 active   Vancomycin 3/6 active   Metronidazole 3/6 active                  Estimated Creatinine Clearance: 72.2 mL/min (based on SCr of 0.88 mg/dL).    Allergies     Allergies   Allergen Reactions   ??? Azithromycin SWOLLEN TONGUE     Per [facility] records - Azithromycin allergy previously deleted from [facility] system but no documentation, needs to be clarified   ??? Avelox [Moxifloxacin] SHORTNESS OF BREATH     Per [facility] records, needs to be clarified   ??? Ciprofloxacin RASH  Per [facility] records, needs to be clarified   ??? Sulfa (Sulfonamide Antibiotics) RASH     Per [facility] records, needs to be clarified     Medications   Scheduled Meds:albuterol 0.5% (PROVENTIL) nebulizer solution 2.5 mg, 2.5 mg, Inhalation, Q4H & PRN  artificial tears/hypromellose (ISOPTO TEARS) 0.5 % ophthalmic solution 1 drop, 1 drop, Both Eyes, QID  aspirin chewable tablet 81 mg, 81 mg, Per NG tube, QDAY atorvastatin (LIPITOR) tablet 40 mg, 40 mg, Per NG tube, QDAY  budesonide respule (PULMICORT) nebulizer solution 0.25 mg, 0.25 mg, Inhalation, BID  cefepime (MAXIPIME) 2 g in sodium chloride 0.9% (NS) 100 mL IVPB (MB+), 2 g, Intravenous, Q12H*  chlorhexidine gluconate (PERIDEX) 0.12 % solution 15 mL, 15 mL, Swish & Spit, BID  hydrocortisone PF (Solu-CORTEF) injection 50 mg, 50 mg, Intravenous, BID  ipratropium bromide (ATROVENT) 0.02 % nebulizer solution 0.5 mg, 0.5 mg, Inhalation, Q4H & PRN  metroNIDAZOLE (FLAGYL) 500 mg IVPB 100 mL, 500 mg, Intravenous, Q8H*  pantoprazole (PROTONIX) injection 40 mg, 40 mg, Intravenous, BID(11-21)  polyethylene glycol 3350 (MIRALAX) packet 17 g, 17 g, Per NG tube, QDAY  vancomycin, random dosing, 1 each, Intravenous, Random Dosing    Continuous Infusions:  ??? anticoagulant citrate dextrose (ACD-A) solution (formula A) 1,000 mL Stopped (05/15/18 0920)   ??? BGK 4/0/1.2 (PRISMASOL) 5,000 mL dialysate Stopped (05/15/18 0920)   ??? BGK 4/0/1.2 (PRISMASOL) 5,000 mL replacement (post-filter) solution Stopped (05/15/18 0920)   ??? calcium chloride 8 g in sodium chloride 0.9% (NS) 1,080 mL IV Infusion Stopped (05/15/18 2440)   ??? EPINEPHrine (ADRENALIN) 16,000 mcg in dextrose 5% (D5W) 250 mL IV drip (quad conc) 0.03 mcg/kg/min (05/15/18 1027)   ??? fentaNYL (SUBLIMAZE)  1000 mcg/100 mL NS IV drip (std conc)(premade) 125 mcg/hr (05/15/18 2536)   ??? insulin regular 100 units/NS 100 mL IV drip (premade) 2.5 Units/hr (05/15/18 0916)   ??? norepinephrine (LEVOPHED) 16 mg in dextrose 5% (D5W) 250 mL IV drip (quad conc) 0.18 mcg/kg/min (05/15/18 6440)   ??? propofoL (DIPRIVAN) 10 mg/mL IV drip 15.031 mcg/kg/min (05/15/18 0615)   ??? vasopressin (VASOSTRICT) 20 Units in sodium chloride 0.9% (NS) 100 mL IV drip (std conc) 2.4 Units/hr (05/15/18 0551)     PRN and Respiratory Meds:albuterol sulfate Q4H PRN, alteplase PRN (On Call from Rx), anticoagulant sodium citrate PRN (On Call from Rx), calcium NA 140   < > 141   < > 139 136* 138 139   K 3.9   < > 4.2   < > 4.0 4.0 4.3 4.0   CL 105   < > 102   < > 101 101 102 103   CO2 21   < > 29   < > 27 27 25 28    BUN 45*   < > 21   < > 17 14 13 11    CR 1.81*   < > 0.96   < > 0.97 0.90 0.92 0.88   GFR 27*   < > 57*   < > 56* >60 60* >60   GLU 173*   < > 160*   < > 193* 164* 193* 154*   CA 7.9*   < > 8.0*   < > 8.3* 8.2* 8.8 8.4*   PO4 4.0   < > 2.1   < > 1.8* 1.8*  --  1.6*   ALBUMIN 3.0*  --  2.9*  --   --   --  3.1*  --    ALKPHOS 123*  --  115*  --   --   --  104  --    AST 130*  --  120*  --   --   --  65*  --    ALT 73*  --  79*  --   --   --  61*  --    TOTBILI 0.7  --  0.9  --   --   --  1.1  --     < > = values in this interval not displayed.       Microbiology, Radiology and other Diagnostics Review   Microbiology data reviewed.    Pertinent radiology images viewed.    3/10 CT chest, abdomen, pelvis w/:  CHEST:  1. Diffuse bilateral pulmonary opacities. Given the patient's bacteremia   and elevated white blood cell count, the leading consideration is   pneumonia.     ABDOMEN AND PELVIS:  1. No abscess or acute inflammatory process within the abdomen or pelvis.  2. Moderate distention of the proximal colon with retained fecal material,   which may represent colonic pseudoobstruction or constipation.  3. No significant change in left inguinal fluid collection, likely postoperative seroma.    3/10 CXR:  No significant change in diffuse mixed opacities throughout both lungs.   Differential considerations include pneumonia, sepsis, ARDS, or other   noncardiogenic edema.    Theotis Burrow, MD   Pager [phone]  Division of Infectious Diseases

## 2018-05-15 NOTE — Progress Notes
Pt's temp 35.9, Bair hugger placed on pt on high setting, warming lights turned on.  Epi titrated down to 0.63mcgs.  CRRT ongoing, pt bathed and repositioned, skin assessment done.

## 2018-05-15 NOTE — Progress Notes
Renal Progress Note    Michelle Solis  Admission Date:  05/17/2018         Assessment and Plan     Principal Problem:    Septic shock (HCC)  Active Problems:    COPD (chronic obstructive pulmonary disease) (HCC)    Acute respiratory failure with hypoxia (HCC)    Iron deficiency anemia    S/P TAVR (transcatheter aortic valve replacement)    ARDS (adult respiratory distress syndrome) (HCC)    PEA (Pulseless electrical activity) (HCC)    Complete heart block (HCC)    Temporary transvenous cardiac pacemaker present    AKI (acute kidney injury) (HCC)    Type 2 NSTEMI (non-ST elevated myocardial infarction) (HCC)    Acute on chronic heart failure with preserved ejection fraction (HFpEF) (HCC)    Atrial fibrillation with RVR (HCC)    Elevated LFTs    Supratherapeutic INR    Recent urinary tract infection    Warfarin anticoagulation    Positive blood cultures    ATN (acute tubular necrosis) (HCC)    Encounter for continuous renal replacement therapy (CRRT) for acute renal failure (HCC)        Michelle Solis is a 78 y.o. female    ???  AKI   - Has not had much urine in last few hours despite aggressive diuretic regimen  - She has gone into shock and I suspect she has ATN  - She had developed a severe  metabolic acidosis  - CRRT started 3/7 and will continue. On citrate protocol.   ???  Non gap Metabolic acidosis - improved with CRRT and bicarb gtt  Severe hypoxic respiratory failure - currenlty requiring 40% FiO2.  ARDS   Pulmonary edema   PEA arrest   Gram negative rod bacteremia   Septic shock on multiple pressors.   GI bleeding       Patient is critically ill with septic shock requiring mutiple pressors, bacteremia, AKI with acidosis requring CRRT. The patient is at high risk for significant morbidity and mortality.  I spent 30 minutes of providing and personally directing critical care services and coordinating care with Pulmonary/Chest: Effort normal. No wheezes and no rales.   Abdominal: Soft. Bowel sounds are normal. No distension. There is no tenderness or rebound.   Musculoskeletal: No edema.   No cyanosis or clubbing.  Neurological: sedated  Skin: Skin is warm and dry. No rash noted.       Labs     Recent Labs     05/12/18  1700 05/12/18  2135 05/13/18  0400 05/13/18  1117 05/13/18  1830 05/14/18  0200 05/14/18  0925 05/14/18  1525 05/14/18  2355 05/15/18  0400 05/15/18  0742 05/15/18  1159   NA 136* 137 140 139 141 141 141 139 136* 138 139 137   K 4.7 5.2* 3.9 3.8 3.9 4.2 4.2 4.0 4.0 4.3 4.0 4.2   CL 104 105 105 102 103 102 102 101 101 102  103 102   CO2 22 22 21 27 27 29 27 27 27  25 28 28    GAP 10 10 14* 10 11 10 12 11 8 11 8 7    BUN 64* 69* 45* 31* 26* 21 19 17 14 13 11 15    CR 2.03* 2.70* 1.81* 1.28* 1.05* 0.96 0.98 0.97 0.90 0.92 0.88 1.20*   GLU 301* 255* 173* 135* 231* 160* 206* 193* 164* 193* 154* 170*   CA 8.8 8.5 7.9* 8.1* 8.1* 8.0*  8.1* 8.3* 8.2* 8.8 8.4* 8.7   ALBUMIN 3.0*  --  3.0*  --   --  2.9*  --   --   --  3.1*  --   --    MG 2.3 2.4 1.9 1.7 2.2 1.9 1.7 1.7 1.6 1.6 1.6 1.6   PO4  --  6.3* 4.0 3.0 2.8 2.1 2.1 1.8* 1.8*  --  1.6* 2.0       Recent Labs     05/12/18  1700 05/13/18  0400 05/14/18  0200 05/14/18  0925 05/14/18  1525 05/15/18  0400   WBC 23.8* 20.2* 26.0*  --   --  22.4*   HGB 10.7* 10.5* 10.1* 10.0*  --  9.3*   HCT 32.8* 33.5* 30.9* 30.3*  --  28.6*   PLTCT 189 218 221  --   --  224   INR 5.1* 5.0* 5.5*  --  2.0* 1.6*   PTT  --   --   --  39.8*  --   --    AST 88* 130* 120*  --   --  65*   ALT 57* 73* 79*  --   --  61*   ALKPHOS 137* 123* 115*  --   --  104      Estimated Creatinine Clearance: 52.9 mL/min (A) (based on SCr of 1.2 mg/dL (H)).  Vitals:    05/14/18 0600 05/14/18 0932 05/15/18 0400   Weight: 122.6 kg (270 lb 4.5 oz) 122 kg (269 lb) 121.5 kg (267 lb 13.7 oz)      Recent Labs     05/15/18  0742 05/15/18  1108   PHART 7.32* 7.34*   PO2ART 108* 94

## 2018-05-15 NOTE — Consults
shock vs septic shock, she was transferred to CICU for further management. Now with ARDS, PEA arrest following broch, complete heart block s/p TVP, on CRRT and Vent.     Subjective interview is limited due to patient factors. Monitor for nonverbal cues such as grimacing, agitation, restlessness.    Medical History:   Diagnosis Date   ??? Abnormal chest CT    ??? Aortic stenosis    ??? Atrial flutter (HCC)    ??? Cerebral aneurysm rupture (HCC) 1998   ??? COPD (chronic obstructive pulmonary disease) (HCC)    ??? Dyspnea    ??? GERD (gastroesophageal reflux disease) 09/13/2011   ??? History of rheumatic fever during childhood   ??? Hypertension 09/13/2011   ??? O2 dependent    ??? Seizures (HCC)     off medications since 2000     Surgical History:   Procedure Laterality Date   ??? HX ENDOVASCULAR ANEURYSM REPAIR  1999    coiling   ??? ANGIOGRAPHY CORONARY ARTERY WITH LEFT HEART CATHETERIZATION N/A 12/27/2016    Performed by Marcell Barlow, MD, FACC at [facility]   ??? POSSIBLE PERCUTANEOUS CORONARY STENT PLACEMENT WITH ANGIOPLASTY N/A 12/27/2016    Performed by Marcell Barlow, MD, FACC at [facility]   ??? PERCUTANEOUS TRANSCATHETER REPLACEMENT AORTIC VALVE - EVOLUT 26 PRO, LEFT COMMON FEMORAL ARTERY APPROACH Bilateral 02/01/2017    Performed by Zella Richer, MD at [facility]   ??? APPENDECTOMY     ??? BACK SURGERY      low lumbar back rod and screws    ??? CHOLECYSTECTOMY     ??? FEMUR SURGERY      rod inserted   ??? HX HEART CATHETERIZATION     ??? HYSTERECTOMY     ??? KNEE REPLACEMENT     ??? TONSILLECTOMY         Social History     Tobacco Use   ??? Smoking status: Former Smoker     Packs/day: 3.00     Years: 35.00     Pack years: 105.00     Types: Cigarettes     Last attempt to quit: 03/07/1994     Years since quitting: 24.2   ??? Smokeless tobacco: Never Used   ??? Tobacco comment: smoked up to 3 packs a day   Substance Use Topics   ??? Alcohol use: Yes     Comment: occassional   ??? Drug use: No     Social History     Social History Narrative   ??? Not on file Marital status: Divorced  Children: Psychologist, occupational  Occupation: Retired. Worked at [employer] in [employer] with [employer].    Spiritual History: Lutheran    Family History   Problem Relation Age of Onset   ??? Cancer Mother         pancreas   ??? Suicide Father    ??? COPD Sister    ??? Diabetes Daughter      Family Status   Relation Name Status   ??? Mother  Deceased   ??? Father  Deceased        suicide   ??? Sister  Alive   ??? Son  Alive   ??? Dau  Alive       Review of Systems  Unable to be obtained due to patient factors    See modified ESAS scale for additional review of symptoms.    Patient able to provide symptom assessment? No;   Pain Assessment in  Advanced Dementia (PAINAD) Scale  Items 0 1 2 Score   Breathing independent of vocalization Normal Occasional labored breathing. Noisy labored breathing.  Long period of hyperventilatsion.  Cheyne-Stokes respirations. 0   Negative vocalization None Occasional moan or groan.  Low-level speech with a negative or disapproving quality. Repeated troubled calling out.  Loud moaning or groaning.  Crying     0   Facial expression Smiling or inexpressive Sad. Frightened. Frown. Facial grimacing. 0   Body language Relaxed Tense. Distressed pacing.  Fidgeting Rigid.  Fists clenched.  Knees pulled up.  Pulling or pushing away.  Striking out. 1   Consolability No need to console Distracted or reassured by voice or touch. Unable to console, distract or reassure. 0      Total ** 1   ** Total scores range from 0-10 (based on a scale of 0-2 for five items), with a higher score indicating more severe pain (0=no pain to 10=severe pain)           OBJECTIVE     Current Scheduled Medicationsalbuterol 0.5% (PROVENTIL) nebulizer solution 2.5 mg, 2.5 mg, Inhalation, Q4H & PRN  artificial tears/hypromellose (ISOPTO TEARS) 0.5 % ophthalmic solution 1 drop, 1 drop, Both Eyes, QID  aspirin chewable tablet 81 mg, 81 mg, Per NG tube, QDAY  atorvastatin (LIPITOR) tablet 40 mg, 40 mg, Per NG tube, QDAY anticoagulant sodium citrate, 1-5 mL, Intra-catheter, PRN (On Call from Rx)  calcium gluconate  IV, 1 g, Intravenous, PRN  diphenhydrAMINE, 25 mg, Intravenous, Q4H PRN  pancrelipase/sodium bicarbonate 20,880 k unit/650 mg, , Per NG tube, PRN (On Call from Rx)  naloxone, 0.08 mg, Intravenous, PRN  ondansetron (ZOFRAN) IV, 4 mg, Intravenous, Q6H PRN  sodium chloride 0.9% (NS), 1,000-2,000 mL, SEE ADMIN INSTRUCTIONS, PRN  sodium phosphate  IVPB, 16 mmol, Intravenous, PRN (On Call from Rx)    Or  sodium phosphate  IVPB, 24 mmol, Intravenous, PRN (On Call from Rx)    Or  sodium phosphate  IVPB, 30 mmol, Intravenous, PRN (On Call from Rx)  vancomycin, pharmacy to manage, 1 each, Service, Per Pharmacy         Allergies: Azithromycin; Avelox [moxifloxacin]; Ciprofloxacin; and Sulfa (sulfonamide antibiotics)    Physical Exam  Blood pressure 112/56, pulse 110, temperature 37.1 ???C (98.8 ???F), height 165.1 cm (65), weight 121.5 kg (267 lb 13.7 oz), SpO2 99 %.    General Appearance: Intubated and sedated, appears critically ill.   HEENT: Normocephalic, without obvious abnormality, atraumatic. PERRL, sclera white; conjunctivae clear, EOMs intact. Nares patent with corpak present. Moist mucous membranes, ETT in place.  Neck: Supple, symmetrical  Respiratory: Coarse to auscultation bilaterally, rhonchi present, no rales, or wheezing, no accessory muscle use.   Cardiovascular: Tachycardic, irreg, S1, S2 normal, no murmur   Extremities: 2+ pulses and symmetrical, all extremities. No cyanosis. 2+ peripheral edema.  Gastrointestinal: Soft, non-tender, distended. Normoactive bowel sounds x4 quadrants. No guarding or rigidity. No rebound tenderness. No masses. No organomegaly.  Musculoskeletal: Moves all extremities. No joint tenderness, swelling, or erythema. No skeletal muscle wasting.   Skin: Skin color normal. Warm and dry to touch.   Neurologic: Sedated.  No myoclonus.    Lab Review & Radiology & Diagnostic Results  Hematology: Lab Results   Component Value Date    HGB 9.3 05/15/2018    HCT 28.6 05/15/2018    PLTCT 224 05/15/2018    WBC 22.4 05/15/2018    NEUT 91 05/15/2018    ANC 20.50 05/15/2018  ALC 0.70 05/15/2018    MONA 6 05/15/2018    AMC 1.20 05/15/2018    ABC 0.00 05/15/2018    MCV 95.2 05/15/2018    MCHC 32.4 05/15/2018    MPV 7.6 05/15/2018    RDW 16.9 05/15/2018   , Coagulation:    Lab Results   Component Value Date    PTT 39.8 05/14/2018    INR 1.6 05/15/2018   , General Chemistry:    Lab Results   Component Value Date    NA 137 05/15/2018    K 4.2 05/15/2018    CL 102 05/15/2018    GAP 7 05/15/2018    BUN 15 05/15/2018    CR 1.20 05/15/2018    GLU 170 05/15/2018    CA 8.7 05/15/2018    ALBUMIN 3.1 05/15/2018    LACTIC 1.4 05/13/2018    OBSCA 1.29 05/15/2018    MG 1.6 05/15/2018    TOTBILI 1.1 05/15/2018   , Enzymes:    Lab Results   Component Value Date    AST 65 05/15/2018    ALT 61 05/15/2018    ALKPHOS 104 05/15/2018    and Mg and PO4:   Lab Results   Component Value Date    MG 1.6 05/15/2018       I have collaborated with Dr. Mady Gemma in the development of patient plan of care, they concur with the content of this note as written see cosignature below    Lequita Halt, APRN-NP  Palliative Care  Available on [employer]  Phone: [phone]  Pager: [phone]  Nights/Weekends - Page [phone] for Palliative Care On-Call     Thank you for allowing [place] to assist in the care of this patient. Please call with questions/concerns.

## 2018-05-15 NOTE — Care Plan
Problem: Falls, High Risk of  Goal: Absence of falls-Adult Patient  Outcome: Add Note  Flowsheets (Taken 05/15/2018 1832)  Absence of falls-Adult Patient: Complete Fall Risk Assessment.; Provide safe ambulation.; Implement fall risk bundle.; Provde safe environment.; Consider additional interventions if patient is confused, has gait/balance problems and on high risk medications.; Provide fall prevention strategies.  Note:   Pt. is sedated on the ventilator but has confusion and has poor safety awareness.      Problem: Tissue Perfusion, Altered  Goal: Adequate tissue perfusion  Outcome: Add Note  Flowsheets (Taken 05/15/2018 1832)  Adequate tissue perfusion: Assess peripheral circulation; Assess mental status; Perform altered tissue perfusion risk assessment; Consider pharmacological agents as ordered; Assess cardiac status; Promote activity to enhance tissue perfusion; Provide altered tissue perfusion signs and symptoms education; Consider fluid resuscitation; Monitor hemodynamic values; Administer oxygen therapy as ordered; Coordinate multi-disciplinary care  Note:   Pt. remains on two pressors and crrt and the ventilator. Pt. BP is within normal limits.      Problem: Infection, Risk of, Central Venous Catheter-Associated Bloodstream Infection  Goal: Absence of CVC Associated Bloodstream infection  Outcome: Add Note  Flowsheets (Taken 05/15/2018 1832)  Absence of CVC associated bloodstream  infection: Assess for central line catheter infection (Monitor SIRS criteria); Follow central line bundle components; Preparation for central venous catheter insertion; Manage central venous catheter; Provide education on central line home care  Note:   one central venous catheter in the groin was removed after there was no longer an indication for the temporary pacemaker to be in place. line was removed without issue.      Problem: Injury-Risk of, Non-Violent Physical Restraints

## 2018-05-15 NOTE — Progress Notes
Pharmacy Vancomycin Note  Subjective:   Michelle Solis is a 78 y.o. female being treated for empiric for sepsis.    Objective:     Current Vancomycin Orders   Medication Dose Route Frequency   ??? vancomycin (VANCOCIN) 1,750 mg in sodium chloride 0.9% (NS) IVPB  15 mg/kg Intravenous ONCE   ??? vancomycin, pharmacy to manage  1 each Service Per Pharmacy   ??? vancomycin, random dosing  1 each Intravenous Random Dosing     Start Date of  vancomycin therapy: 05-14-2018  Additional Abx: Cefepime, Flagyl  Cultures:  ,  ,  ,    White Blood Cells   Date/Time Value Ref Range Status   05/14/2018 0200 26.0 (H) 4.5 - 11.0 K/UL Final   05/13/2018 0400 20.2 (H) 4.5 - 11.0 K/UL Final   05/12/2018 1700 23.8 (H) 4.5 - 11.0 K/UL Final   05/12/2018 1350 26.9 (H) 4.5 - 11.0 K/UL Final   05/12/2018 0400 18.7 (H) 4.5 - 11.0 K/UL Final     Creatinine   Date/Time Value Ref Range Status   05/14/2018 1525 0.97 0.4 - 1.00 MG/DL Final   45/40/9811 9147 0.98 0.4 - 1.00 MG/DL Final   82/95/6213 0865 0.96 0.4 - 1.00 MG/DL Final     Blood Urea Nitrogen   Date/Time Value Ref Range Status   05/14/2018 1525 17 7 - 25 MG/DL Final     Estimated CrCl: CRRT      Intake/Output Summary (Last 24 hours) at 05/14/2018 2128  Last data filed at 05/14/2018 2100  Gross per 24 hour   Intake 6352.1 ml   Output 78469 ml   Net -3679.9 ml      Actual Weight:  122 kg (269 lb)  Dosing BW:  125 kg   Drug Levels:  Vancomycin Random   Date/Time Value Ref Range Status   05/14/2018 2010 13.4 MCG/ML Final         Assessment:   Target levels for this patient:  1.  AUC (mcg*h/mL):  400-600  2. Trough 15 - 20, until AUC calculation available  Evaluation of AUC and/or level(s): Subtherapeutic; goal trough 15-20    Plan:   1. Subtherapeutic trough,  checked at ~ 20 hours post dose, thus true trough ~10-12. Redosing with 1750mg  x1 tonight. Plan for continuing random dosing vs scheduling deferred to team PharmD tomorrow.   2. Next scheduled level(s): AUCs once steady state reached

## 2018-05-16 DIAGNOSIS — A419 Sepsis, unspecified organism: ICD-10-CM

## 2018-05-16 LAB — BASIC METABOLIC PANEL CELLULAR THERAPEUTICS
Lab: 0.9 mg/dL (ref 0.4–1.00)
Lab: 11 mg/dL (ref 7–25)
Lab: 136 mg/dL — ABNORMAL HIGH (ref 70–100)
Lab: 138 MMOL/L (ref 137–147)
Lab: 25 MMOL/L (ref 21–30)
Lab: 59 mL/min — ABNORMAL LOW (ref >60)
Lab: 8.4 mg/dL — ABNORMAL LOW (ref 8.5–10.6)
Lab: >60 mL/min (ref >60)
Lab: 8 (ref 3–12)
Lab: 3.8 MMOL/L — ABNORMAL HIGH (ref >60)

## 2018-05-16 LAB — CBC
Lab: 16 % — ABNORMAL HIGH (ref 11–15)
Lab: 2.6 M/UL — ABNORMAL LOW (ref 4.0–5.0)
Lab: 20 10*3/uL — ABNORMAL HIGH (ref 4.5–11.0)
Lab: 233 10*3/uL (ref 150–400)
Lab: 25 % — ABNORMAL LOW (ref 36–45)
Lab: 31 pg (ref 26–34)
Lab: 33 g/dL (ref 32.0–36.0)
Lab: 8 FL (ref 7–11)
Lab: 8.4 g/dL — ABNORMAL LOW (ref 12.0–15.0)
Lab: 94 FL (ref 80–100)

## 2018-05-16 LAB — BLOOD GASES, ARTERIAL
Lab: 2.3 MMOL/L
Lab: 26 MMOL/L (ref 21–28)
Lab: 54 mmHg — ABNORMAL HIGH (ref >60)
Lab: 7.3 mL/min — ABNORMAL LOW (ref >60)
Lab: 80 mmHg (ref 80–100)
Lab: 96 % (ref 95–99)
Lab: 1.2 MMOL/L
Lab: 25 MMOL/L (ref 21–28)
Lab: 56 mmHg — ABNORMAL HIGH (ref 35–45)
Lab: 7.3 — ABNORMAL LOW (ref 7.35–7.45)
Lab: 76 mmHg — ABNORMAL LOW (ref 80–100)
Lab: 94 % — ABNORMAL LOW (ref 95–99)
Lab: 0.8 MMOL/L
Lab: 25 MMOL/L (ref 21–28)
Lab: 55 mmHg — ABNORMAL HIGH (ref 35–45)
Lab: 61 mmHg — ABNORMAL LOW (ref 80–100)
Lab: 7.3 — ABNORMAL LOW (ref 7.35–7.45)
Lab: 89 % — ABNORMAL LOW (ref 95–99)

## 2018-05-16 LAB — IONIZED CALCIUM, POST FILTER
Lab: 0.4 MMOL/L
Lab: 0.4 MMOL/L
Lab: 0.3 MMOL/L

## 2018-05-16 LAB — IONIZED CALCIUM,BG
Lab: 1.1 MMOL/L (ref 1.0–1.3)
Lab: 1.1 MMOL/L (ref 1.0–1.3)
Lab: 1.1 MMOL/L (ref 1.0–1.3)

## 2018-05-16 LAB — PTT (APTT): Lab: 64 s — ABNORMAL HIGH (ref 24.0–36.5)

## 2018-05-16 LAB — POC GLUCOSE
Lab: 113 mg/dL — ABNORMAL HIGH (ref 70–100)
Lab: 141 mg/dL — ABNORMAL HIGH (ref 70–100)
Lab: 129 mg/dL — ABNORMAL HIGH (ref 70–100)
Lab: 128 mg/dL — ABNORMAL HIGH (ref 70–100)
Lab: 178 mg/dL — ABNORMAL HIGH (ref 70–100)
Lab: 185 mg/dL — ABNORMAL HIGH (ref 70–100)

## 2018-05-16 LAB — MAGNESIUM  CELLULAR THERAPEUTICS
Lab: 1.6 mg/dL (ref 1.6–2.6)
Lab: 1.6 mg/dL — ABNORMAL HIGH (ref 1.6–2.6)

## 2018-05-16 LAB — VANCOMYCIN TIMED LEVEL: Lab: 16 ug/mL — ABNORMAL LOW (ref 13.5–16.5)

## 2018-05-16 LAB — PHOSPHORUS  CELLULAR THERAPEUTICS: Lab: 1.5 mg/dL — ABNORMAL LOW (ref 2.0–4.5)

## 2018-05-16 LAB — COMPREHENSIVE METABOLIC PANEL: Lab: 103 MMOL/L (ref 98–110)

## 2018-05-16 MED ORDER — MAGNESIUM SULFATE IN D5W 1 GRAM/100 ML IV PGBK
1 g | INTRAVENOUS | 0 refills | Status: CP
Start: 2018-05-16 — End: ?
  Administered 2018-05-17 (×2): 1 g via INTRAVENOUS

## 2018-05-16 MED ORDER — VANCOMYCIN 1,750 MG IVPB
1750 mg | Freq: Once | INTRAVENOUS | 0 refills | Status: CP
Start: 2018-05-16 — End: ?
  Administered 2018-05-16 (×2): 1750 mg via INTRAVENOUS

## 2018-05-16 MED ORDER — HEPARIN (PORCINE) 20,000 UNIT/250 ML IV INFUSION
0-2000 [IU]/h | INTRAVENOUS | 0 refills | Status: AC
Start: 2018-05-16 — End: ?
  Administered 2018-05-16 – 2018-05-17 (×3): 1458 [IU]/h via INTRAVENOUS

## 2018-05-16 MED ORDER — SODIUM PHOSPHATE IVPB
30 MMOL | Freq: Once | INTRAVENOUS | 0 refills | Status: CP
Start: 2018-05-16 — End: ?
  Administered 2018-05-17 (×2): 30 mmol via INTRAVENOUS

## 2018-05-16 MED ORDER — POTASSIUM CHLORIDE 20 MEQ/15 ML PO LIQD
20 meq | Freq: Once | NASOGASTRIC | 0 refills | Status: CP
Start: 2018-05-16 — End: ?
  Administered 2018-05-16: 18:00:00 20 meq via NASOGASTRIC

## 2018-05-16 MED ORDER — METHYLNALTREXONE 12 MG/0.6 ML SC SOLN
12 mg | Freq: Once | SUBCUTANEOUS | 0 refills | Status: CP
Start: 2018-05-16 — End: ?
  Administered 2018-05-16: 15:00:00 12 mg via SUBCUTANEOUS

## 2018-05-16 MED ADMIN — DEXTROSE 50 % IN WATER (D50W) IV SOLP [86270]: 25 mL | INTRAVENOUS | @ 04:00:00 | Stop: 2018-05-16 | NDC 00409664802

## 2018-05-16 NOTE — Progress Notes
Heart Failure Progress Note    NAME:Kameron CHIYO FAY                                                                   MRN: [id number]                 DOB:09-12-1940          AGE: 78 y.o.  ADMISSION DATE: 05/30/2018             DAYS ADMITTED: LOS: 5 days     Assessment:  Principal Problem:    Septic shock (HCC)  Active Problems:    COPD (chronic obstructive pulmonary disease) (HCC)    Acute respiratory failure with hypoxia (HCC)    Iron deficiency anemia    S/P TAVR (transcatheter aortic valve replacement)    ARDS (adult respiratory distress syndrome) (HCC)    PEA (Pulseless electrical activity) (HCC)    Complete heart block (HCC)    Temporary transvenous cardiac pacemaker present    AKI (acute kidney injury) (HCC)    Type 2 NSTEMI (non-ST elevated myocardial infarction) (HCC)    Acute on chronic heart failure with preserved ejection fraction (HFpEF) (HCC)    Atrial fibrillation with RVR (HCC)    Elevated LFTs    Supratherapeutic INR    Recent urinary tract infection    Warfarin anticoagulation    Positive blood cultures    ATN (acute tubular necrosis) (HCC)    Encounter for continuous renal replacement therapy (CRRT) for acute renal failure (HCC)       Plan:  Today:  1. Blood cultures positive for gram positive rods resembling diphtheroids  2. Blood cultures at OSH positive for corynebacterium  3. F/U Corynebacterium susceptibility testing  4. Continue broad spectrum coverage with vancomycin, cefepime, flagyl  5. Crrently intubated on VC-AC, continue to wean as tolerated  6. Patient now requiring 2 pressors, continue to wean as tolerated  7. CT Chest, Abdomen, Pelvis with diffuse pulmonary opacities, without abdominal abscess or acute inflammatory process   8. CT with evidence of proximal colon distension, will try enema today along with Miralax, Senna, Docusate; patient without bowel movement since 3/6  9. Monitor renal function, CRRT in place, bladder can Q8 hours  10. Infectious Disease following CT Chest, Abdomen, Pelvis W Contrast 3/10:   IMPRESSION  CHEST:  1. Diffuse bilateral pulmonary opacities. Given the patient's bacteremia   and elevated white blood cell count, the leading consideration is   pneumonia.   ABDOMEN AND PELVIS:  1. No abscess or acute inflammatory process within the abdomen or pelvis.  2. Moderate distention of the proximal colon with retained fecal material,   which suggestive of colonic pseudoobstruction or constipation.      Melford Aase, MS4

## 2018-05-16 NOTE — Care Plan
Pt unable to participate in plan of care at this time. Pt was agitated/restless at start of shift, increased propofol and pt is comfortably resting with intermittent periods of wakefulness. Pt blood sugar dropped to 51 this shift while on insulin drip, BS currently in the 110s, will continue to follow [facility] protocol for restarting of insulin. Access pressures on pt's CRRT went from -20s to 400s this shift after turning pt/giving a bath. No apparent signs of kinking in line, and no clots visible in tubing or filter. CRRT is running with no problems at this time despite the change, will continue to monitor. Running pt Net 0 at this time per Dr. Lavonne Chick request after pt was net negative 2.3L yesterday. VSS, will try to titrate of pressors as possible.

## 2018-05-16 NOTE — Progress Notes
???   insulin regular 100 units/NS 100 mL IV drip (premade) Stopped (05/15/18 2227)   ??? norepinephrine (LEVOPHED) 16 mg in dextrose 5% (D5W) 250 mL IV drip (quad conc) 0.2 mcg/kg/min (05/16/18 1229)   ??? propofoL (DIPRIVAN) 10 mg/mL IV drip 10 mcg/kg/min (05/16/18 1241)   ??? vasopressin (VASOSTRICT) 20 Units in sodium chloride 0.9% (NS) 100 mL IV drip (std conc) 3.6 Units/hr (05/16/18 1231)     Prn albuterol sulfate Q4H PRN, alteplase PRN (On Call from Rx), anticoagulant sodium citrate PRN (On Call from Rx) 2.8 mL at 05/15/18 0921, calcium gluconate  IV PRN, [DISCONTINUED] diphenhydrAMINE Q4H PRN **OR** diphenhydrAMINE Q4H PRN, pancrelipase/sodium bicarbonate 20,880 k unit/650 mg PRN (On Call from Rx), naloxone PRN, ondansetron (ZOFRAN) IV Q6H PRN, sodium chloride 0.9% (NS) PRN Stopped at 05/15/18 1124, sodium phosphate  IVPB PRN (On Call from Rx) **OR** sodium phosphate  IVPB PRN (On Call from Rx) **OR** sodium phosphate  IVPB PRN (On Call from Rx), [DISCONTINUED] vancomycin  (0-40 kg) IV Q12H* **AND** vancomycin, pharmacy to manage Per Pharmacy       Physical Exam        Vital Signs: Last Filed In 24 Hours Vital Signs: 24 Hour Range   Temp: 36.9 ???C (98.4 ???F) (03/11 1200)  Pulse: 88 (03/11 1213)  Respirations: 20 PER MINUTE (03/11 1213)  SpO2: 97 % (03/11 1213)  SpO2 Pulse: 94 (03/11 1100) ABP: (81-148)/(47-72)   Temp:  [36.3 ???C (97.3 ???F)-36.9 ???C (98.4 ???F)]   Pulse:  [83-110]   Respirations:  [7 PER MINUTE-30 PER MINUTE]   SpO2:  [95 %-100 %]           Vitals:    05/14/18 0600 05/14/18 0932 05/15/18 0400   Weight: 122.6 kg (270 lb 4.5 oz) 122 kg (269 lb) 121.5 kg (267 lb 13.7 oz)       Constitutional: Appears very ill, elderly and ill, intubated and sedated. No distress.   Head: Normocephalic and atraumatic.   Mouth/Throat: No oropharyngeal exudate.   Eyes: No scleral icterus or injection.   Neck: No JVD present. No tracheal deviation present.   Cardiovascular: Normal rate, regular rhythm, normal heart sounds and

## 2018-05-16 NOTE — Progress Notes
1200 - TMP pressure rising on CRRT machine. Charge RN at bedside for trouble shoot, patient repositioned. Patient requiring additional pressor support, decreasing fluid removal rate. Per Dr. Donnalee Curry, to stop pulling fluid. Resident MD notified of need for electrolyte replacement. Potassium placed, no desire for Mag at this time.     1300 - Calling [employer] support for CRRT, access chamber not filling fully with blood and readings fluctuating. Access pod cleaned, along with censor and numbers normalizing.    1400 - Okay per Dr. Ulis Rias to resume pulling Net 0 at this time.

## 2018-05-16 NOTE — Progress Notes
Discussing ABG results with Dr. Ulis Rias. Per MD, to turn FiO2 to 50% at this time. Will repeat ABG's at 2200, and with am labs. Orders placed, will continue to monitor.

## 2018-05-16 NOTE — Progress Notes
Pharmacy Vancomycin Note  Subjective:   Michelle Solis is a 78 y.o. female being treated for empiric for sepsis.    Objective:     Current Vancomycin Orders   Medication Dose Route Frequency   ??? vancomycin (VANCOCIN) 1,750 mg in sodium chloride 0.9% (NS) IVPB  15 mg/kg Intravenous ONCE   ??? vancomycin, pharmacy to manage  1 each Service Per Pharmacy   ??? vancomycin, random dosing  1 each Intravenous Random Dosing     Start Date of  vancomycin therapy: Jun 07, 2018  Additional Abx: Cefepime, flagyl  Cultures: Gram (+) x2 blood cultures  ,  ,  ,    White Blood Cells   Date/Time Value Ref Range Status   05/15/2018 0400 22.4 (H) 4.5 - 11.0 K/UL Final   05/14/2018 0200 26.0 (H) 4.5 - 11.0 K/UL Final   05/13/2018 0400 20.2 (H) 4.5 - 11.0 K/UL Final     Creatinine   Date/Time Value Ref Range Status   05/15/2018 2020 0.93 0.4 - 1.00 MG/DL Final   16/12/9602 5409 1.20 (H) 0.4 - 1.00 MG/DL Final   81/19/1478 2956 0.88 0.4 - 1.00 MG/DL Final     Blood Urea Nitrogen   Date/Time Value Ref Range Status   05/15/2018 2020 11 7 - 25 MG/DL Final     Estimated CrCl: CRRT    Actual Weight:  121.5 kg (267 lb 13.7 oz)  Dosing BW:  125 kg   Drug Levels:  Vancomycin Random   Date/Time Value Ref Range Status   05/15/2018 2020 16.3 MCG/ML Final       Assessment:   Target levels for this patient:  1.  AUC (mcg*h/mL):  400-600  2. Trough 15-20      Evaluation of AUC and/or level(s): Therapeutic, at goal 15- 20    Plan:   1. Therapeutic level, redosing with 1750mg  x1. Defer to team pharmD continued plan.  2. Next scheduled level(s): AUCs once steady state reached  3. Pharmacy will continue to monitor and adjust therapy as needed.    Adelfa Koh, [facility]  05/15/2018

## 2018-05-16 NOTE — Progress Notes
Critical Care Progress Note      Today's Date:  05/15/2018  Name:  Michelle Solis                       MRN:  [id number]   Admission Date: 05-28-18  LOS: 4 days                     Assessment/Plan:   Principal Problem:    Septic shock (HCC)  Active Problems:    COPD (chronic obstructive pulmonary disease) (HCC)    Acute respiratory failure with hypoxia (HCC)    Iron deficiency anemia    S/P TAVR (transcatheter aortic valve replacement)    ARDS (adult respiratory distress syndrome) (HCC)    PEA (Pulseless electrical activity) (HCC)    Complete heart block (HCC)    Temporary transvenous cardiac pacemaker present    AKI (acute kidney injury) (HCC)    Type 2 NSTEMI (non-ST elevated myocardial infarction) (HCC)    Acute on chronic heart failure with preserved ejection fraction (HFpEF) (HCC)    Atrial fibrillation with RVR (HCC)    Elevated LFTs    Supratherapeutic INR    Recent urinary tract infection    Warfarin anticoagulation    Positive blood cultures    ATN (acute tubular necrosis) (HCC)    Encounter for continuous renal replacement therapy (CRRT) for acute renal failure (HCC)        Michelle Solis is a 77yo WF with PMH AS s/p TAVR, 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     Neuro: Currently sedated on propofol and fentanyl. Comfortable. Will pursue daily awakening trials.   Cardiac: Formal TTE with normal EF. Hx of TAVR on coumadin. PEA arrest 3/7 with complete heart block, now resolved. Remains on epi, levo, vaso.  Will wean epi as tolerated today.  HR improved.  Remains in a-fib with rates 110's.  Will hold amio for today.  D/c temp pacer today.      Pulmonary: Remains intubated with high ventilator settings, although improving.  CXR somewhat improved.  Oxygenation improving and CO2 clearance improving.  Will wean vent settings as able.  CT with ARDS

## 2018-05-16 NOTE — Progress Notes
Negative Wound Pressure Device Type: KCI VAC    Contact layer: None    Number of sponges in the wound prior to dressing change: 1    Number of sponges removed from the wound: 1    Type of sponge: Black foam    Number of sponges placed in the wound: 1     Wound vac settings continuous. Plan next dressing change 06/12/18.    Will continue to follow.    Jerene Canny, RN BSN CWON  [employer]; [phone]  Pager; [phone]  [facility] Team Pager [phone]

## 2018-05-17 LAB — BLOOD GASES, ARTERIAL
Lab: 0.8 MMOL/L
Lab: 25 MMOL/L (ref 21–28)
Lab: 58 mmHg — ABNORMAL HIGH (ref 35–45)
Lab: 65 mmHg — ABNORMAL LOW (ref 80–100)
Lab: 90 % — ABNORMAL LOW (ref 95–99)
Lab: 7.2 — ABNORMAL LOW (ref 7.35–7.45)
Lab: 0.4 MMOL/L
Lab: 85 mmHg (ref 80–100)
Lab: 95 % (ref 95–99)

## 2018-05-17 LAB — IONIZED CALCIUM, POST FILTER
Lab: 0.4 MMOL/L
Lab: 0.4 MMOL/L
Lab: 0.5 MMOL/L

## 2018-05-17 LAB — POC GLUCOSE
Lab: 175 mg/dL — ABNORMAL HIGH (ref 70–100)
Lab: 159 mg/dL — ABNORMAL HIGH (ref 70–100)
Lab: 149 mg/dL — ABNORMAL HIGH (ref 70–100)
Lab: 169 mg/dL — ABNORMAL HIGH (ref 70–100)

## 2018-05-17 LAB — IONIZED CALCIUM,BG
Lab: 1.1 MMOL/L — ABNORMAL LOW (ref >60)
Lab: 1 MMOL/L (ref 1.0–1.3)
Lab: 1.1 MMOL/L (ref 1.0–1.3)

## 2018-05-17 LAB — GRAM STAIN

## 2018-05-17 LAB — MAGNESIUM  CELLULAR THERAPEUTICS: Lab: 1.8 mg/dL — ABNORMAL HIGH (ref >60)

## 2018-05-17 LAB — PTT (APTT): Lab: 67 s — ABNORMAL HIGH (ref 24.0–36.5)

## 2018-05-17 LAB — PROTIME INR (PT): Lab: 1.9 MMOL/L — ABNORMAL HIGH (ref >60)

## 2018-05-17 LAB — COMPREHENSIVE METABOLIC PANEL: Lab: 137 MMOL/L — ABNORMAL LOW (ref 137–147)

## 2018-05-17 MED ORDER — VANCOMYCIN 1,500 MG IVPB
1500 mg | Freq: Once | INTRAVENOUS | 0 refills | Status: CP
Start: 2018-05-17 — End: ?
  Administered 2018-05-18 (×2): 1500 mg via INTRAVENOUS

## 2018-05-17 MED ORDER — INSULIN ASPART 100 UNIT/ML SC FLEXPEN
0-6 [IU] | Freq: Every day | SUBCUTANEOUS | 0 refills | Status: DC
Start: 2018-05-17 — End: 2018-05-18

## 2018-05-17 MED ORDER — MAGNESIUM SULFATE IN D5W 1 GRAM/100 ML IV PGBK
1 g | Freq: Once | INTRAVENOUS | 0 refills | Status: CP
Start: 2018-05-17 — End: ?
  Administered 2018-05-18: 02:00:00 1 g via INTRAVENOUS

## 2018-05-17 MED ORDER — MAGNESIUM SULFATE IN D5W 1 GRAM/100 ML IV PGBK
1 g | Freq: Once | INTRAVENOUS | 0 refills | Status: CP
Start: 2018-05-17 — End: ?
  Administered 2018-05-17: 11:00:00 1 g via INTRAVENOUS

## 2018-05-17 NOTE — Progress Notes
Chaplain Note:    Admit Date: May 29, 2018     Per consult, attempted to meet with family for spiritual support. Pt is intubated an unable to participate in visit. No family members were present at time of visit. I consulted with pt's nurse and she will page me if family arrives this morning. Nurse also said that a family meeting is planned for 1200 today. I will not be in the hospital at that time and asked the nurse to page the on-call chaplain if spiritual support is needed. I will also attempt to follow up for support for family.        Date/Time:                      User:                                    Pager: [phone]  05/17/2018 9:50 AM Dellis Filbert

## 2018-05-17 NOTE — Progress Notes
HF approved beta blockers: Yes (Carvedilol) ???   ACE/ARB/ Angiotensin II Receptor Blocker Neprilysin Inhibitor: No  Ejection Fraction >= 40% ???   Aldosterone Antagonist: Yes ???   Isordil/hydralazine: No (EF>40) ???   Ivabradine: No; Not treated with maximally tolerated dose beta blockers or beta blockers contraindicated ???   HRM Device Therapy: No (EF>35%) ???   Anticoagulation for current or history of atrial fibrillation/flutter: Yes ???   ???  - Echocardiogram formal read with estimated CVP 10-15, however on team's read IVC does not appear dilated.   - Received 40 mg IV lasix on arrival, 80 mg IV once 3/6 evening  - Sepsis work up as below, as believe patient's presentation more consistent with sepsis rather than acute heart failure    CARDIAC  Atrial fibrillation with RVR  Type 2 NSTEMI  - On warfarin for [facility]  - On admission to [facility] HR sustaining in the 140s, EKG consistent with atrial fibrillation with RVR  - Troponin only mildly elevated on admission, likely Type 2 NSTEMI given presentation in atrial fibrillation with RVR  - Overnight 3/7-3/8 concern patient was having runs of sustained Vtach. Likely wide complex atrial fibrillation  - Discontinue lidocaine 3/8 am  - Discontinue Amiodarone 3/10  - Therapeutic Heparin drip      PEA Arrest  Complete Heart Block  - PEA Arrest after bronchoscopy 3/7  - ROSC after 1 round compressions, 1 mg epinephrine given.   - TVP placement 05/12/2018  - TVP discontinued 05/15/2018    Aortic Stenosis s/p TAVR  - On warfarin  - Could consider TEE when stable  ???  PULM  Acute Hypoxic Respiratory Failure  ARDS  - Respiratory distress on arrival. Placed on BiPAP 10/5 with initial improvement BiPAP  - ABGs within normal limits on BiPAP  - 3/7: Patient still with significant work of breathing. CXR worsening - pneumonia vs edema vs ARDS. Intubated  - 3/10 CT Chest: diffuse pulmonary infiltrates, concern for pneumonia  - Permissive hypercardia. Weaning FiO2 & PEEP  ???  COPD  - Duonebs q4 - Stopped IV methylprednisolone on 3/7 - 3/11  ???  GI  - NPO while on NIPPV  - Wound vac on abdomen, does not appear acute infected wound team consulted  - Obtained consent for blood products, type and cross  - Vitamin K, 2 Units FFP, PPI dose BID on 3/9  - Hemoglobin stable, continue to monitor output   - Patient now with ostomy in place over wound vac site, fecal matter expressed overnight  - Patient also with fecal looking matter in OG tube  - Surgery consult placed today 3/12  ???  Constipation  - Pt with fecal material in OG tube 3/12. KUB obtained without evidence of SBO    Elevated LFTs  - Suspect due to congestive hepatopathy.  - Platelets, albumin low as well  - Monitor  ???  RENAL/GU  AKI, suspected ATN  - Suspected ATN  - Creatinine baseline appears to be ~1  - Cr 1.5 on arrival  - CRRT initiated 3/7.   - Foley removed, prn bladder scans  - Nephrology following    Respiratory Acidosis, resolved  - pH 7.22, pCo2 66, pO2 145, Bicarb 22.5  - Monitor. Permissive hypercarbia with ARDS   ???  ID  Septic Shock  - On presentation SIRS 3/4: WBC elevated at OSH, Tachycardic in 140s, RR 26, Afebrile  - Requiring norepinephrine on arrival, quickly able to wean off  - Lactate unremarkable  -  Procal elevated at 1.72, ESR 72, CRP 36.53  - Blood cultures at [facility] positive for Corynebacterium 1/4 bottles (aerobic). Blood cultures here positive gram positive rods  - Source likely endocarditis vs. Pneumonia, CT 3/10 without evidence of abdominal or pelvic abscess or acute inflammatory process  - Stress dose steroids discontinued 3/10  - Wean vasopressors as tolerated  - Epinephrine discontinued 3/10   - Continue Vancomycin and Cefepime and flagyl, monitor response  - Infectious Disease Consulted given blood culture findings   - Seroma aspirated 3/12, follow up testing  ???  Urinary tract infection - resolved  - Recently completed course of Keflex after being diagnosed with UTI approximately one week ago Neck Veins: Central lines in place bilateral IJ's   Chest Inspection: left chest incision well healed  Auscultation/Percussion: breathing comfortably, mechanical breath sounds  Cardiac Auscultation: Regular rhythm, S1, S2, no S3 or S4, no murmur  Radial Arteries: normal symmetric radial pulses  Pedal Pulses: pulses 2+, symmetric  Lower Extremities:  warm extremities   Abdominal Exam: soft, non-tender, bowel sounds normal,ostomy in place left abdomen    Laboratory Review:   24-hour labs:    Results for orders placed or performed during the hospital encounter of 05-26-18 (from the past 24 hour(s))   BLOOD GASES, ARTERIAL    Collection Time: 05/16/18  1:48 PM   Result Value Ref Range    pH-Arterial 7.31 (L) 7.35 - 7.45    pCO2-Arterial 56 (H) 35 - 45 MMHG    pO2-Arterial 76 (L) 80 - 100 MMHG    Base Excess-Arterial 1.2 MMOL/L    O2 Sat-Arterial 94.0 (L) 95 - 99 %    Bicarbonate-ART-Cal 25.4 21 - 28 MMOL/L   PTT (APTT)    Collection Time: 05/16/18  4:00 PM   Result Value Ref Range    APTT 64.0 (H) 24.0 - 36.5 SEC   POC GLUCOSE    Collection Time: 05/16/18  4:00 PM   Result Value Ref Range    Glucose, POC 185 (H) 70 - 100 MG/DL   BLOOD GASES, ARTERIAL    Collection Time: 05/16/18  5:27 PM   Result Value Ref Range    pH-Arterial 7.31 (L) 7.35 - 7.45    pCO2-Arterial 55 (H) 35 - 45 MMHG    pO2-Arterial 61 (L) 80 - 100 MMHG    Base Excess-Arterial 0.8 MMOL/L    O2 Sat-Arterial 89.3 (L) 95 - 99 %    Bicarbonate-ART-Cal 25.0 21 - 28 MMOL/L   BASIC METABOLIC PANEL CELLULAR THERAPEUTICS    Collection Time: 05/16/18  8:17 PM   Result Value Ref Range    Sodium 136 (L) 137 - 147 MMOL/L    Potassium 4.5 3.5 - 5.1 MMOL/L    Chloride 103 98 - 110 MMOL/L    CO2 26 21 - 30 MMOL/L    Anion Gap 7 3 - 12    Glucose 193 (H) 70 - 100 MG/DL    Blood Urea Nitrogen 9 7 - 25 MG/DL    Creatinine 1.61 0.4 - 1.00 MG/DL    Calcium 8.3 (L) 8.5 - 10.6 MG/DL    eGFR Non African American >60 >60 mL/min    eGFR African American >60 >60 mL/min Glucose, POC 183 (H) 70 - 100 MG/DL   POC GLUCOSE    Collection Time: 05/17/18  8:13 AM   Result Value Ref Range    Glucose, POC 159 (H) 70 - 100 MG/DL   CELL COUNT W/DIFF-FLUIDS  Collection Time: 05/17/18  8:37 AM   Result Value Ref Range    White Blood Cells,Fluid 220 /UL    Red Blood Cells,Fluid 5,700 /UL    Segmented Neutrophils, Fluid 90 %    Lymphocytes,Fluid 4 %    Monocyte/Histo,Fluid 6 %    Fluid Source MISC FLUID     Pathology Interpretation,Fluid      Pathologist Signature     POC GLUCOSE    Collection Time: 05/17/18  9:00 AM   Result Value Ref Range    Glucose, POC 149 (H) 70 - 100 MG/DL   IONIZED CALCIUM, POST FILTER    Collection Time: 05/17/18 11:15 AM   Result Value Ref Range    Ionized Calcium, Post Filter 0.57 MMOL/L   POC GLUCOSE    Collection Time: 05/17/18 11:18 AM   Result Value Ref Range    Glucose, POC 169 (H) 70 - 100 MG/DL   IONIZED CALCIUM,BG    Collection Time: 05/17/18 11:20 AM   Result Value Ref Range    Ionized Calcium 1.16 1.0 - 1.3 MMOL/L       Point of Care Testing:  (Last 24 hours):  Glucose: (!) 182 (05/17/18 0404)  POC Glucose (Download): (!) 169 (05/17/18 1118)    Echocardiogram Details:  Echo Results  (Last 3 results in the past 3 years)    Echo EF LVIDD LA Size IVS LVPW Rest PAP    (05/14/18)  40 (05/14/18)  4.5 (05/14/18)  4.1 (05/14/18)  1.0 (05/14/18)  1.2 (03/20/17)  N/A    (05/13/2018)  55 (05/26/2018)  4.16 (05/17/2018)  3.06 (06/05/2018)  1.70 (05/10/2018)  0.84 (02/17/17)  N/A    (03/20/17)  40 (03/20/17)  5.50 (03/20/17)  4.40 (03/20/17)  1.20 (03/20/17)  1.00 (02/02/17)  N/A           Chest X-Ray:    3/9:   IMPRESSION  No significant change in diffuse mixed opacities throughout both lungs.   Differential considerations include pneumonia, sepsis, ARDS, or other   noncardiogenic edema.    Chest X-Ray:  3/11:  IMPRESSION  1. ???Endotracheal tube with tip well above the carina.  2. ???Slight improvement in diffuse mixed opacities, predominantly within

## 2018-05-17 NOTE — Progress Notes
burden on CT.  Will add enemas, pro-motility agents and methylnaltrexone.    ID:  WBC remains elevated. Currently on Vanc/cefepime/flagyl. ID following. Growing corynebacterium from blood at OSH and here.  CT C/A/P without discrete abscess seen.  Cont to follow cultures.  There was a large seroma in her left groin on CT.  ID asking about obtaining sample with concern for infection.  I have a very low suspicion that this is the etiology of her infection as there is no edema or stranding around it and there does not appear to be debris internally, however, it is superficial so will sample it after holding heparin.    Renal:  AKI on admission with baseline serum Cr ~1.0. Suspect ATN from recurrent episodes of hypotension. Remains on CVVH.  Will cont with volume removal as tolerated.  Acidosis improving.    Heme:  INR 1.7 today.  Bloody gastric output improved.  No urgent need for anticoagulation currently as bleeding risk likely outweighs benefit in setting of a-fib.  Endo:  Modified Yale Insulin Protocol, d/c steroids today.  Prophylaxis: HOB>40, PPI, DVT prophylaxis   Disposition/Family: Needs ICU for respiratory distress requiring mechanical ventilation, pressor requirement, ARDS, distributive shock.  Discussed with daughter at bedside.  Poor prognosis.      Prophylaxis Review:  Lines:  Yes;   Arterial Line; Indication:  Frequent blood draws and Continuous BP monitoring; Location:  Radial  Central Line; Indication:  Dialysis; Location:  Internal Jugular  Central Line; Indication:  Medications; Location:  Internal Jugular  Central Line; Indication:  TVP; Location:  Femoral  Urinary Catheter:  None  Antibiotic Usage:  Yes; Infection present or suspected:  Empiric  VTE: Per Primary  _____________________________________________________________________________    I have seen, examined and reviewed data concerning this patient.  I discussed the findings and plan of care with the ICU team. I spent 110

## 2018-05-17 NOTE — Consults
lactobacillus rhamnosus (GG) (CULTURELLE) 10 billion cell cap Take 1 capsule by mouth daily.    MULTIVITAMIN PO Take 1 tablet by mouth daily.    omeprazole DR(+) (PRILOSEC) 40 mg capsule Take 1 capsule by mouth daily.    spironolactone (ALDACTONE) 25 mg tablet Take one tablet by mouth daily. Take with food.    tiotropium bromide (SPRIVA RESPIMAT) 1.25 mcg/actuation inhaler Inhale 2 puffs by mouth into the lungs daily.    warfarin (COUMADIN) 1 mg tablet Take 1 mg by mouth once daily in the evening on Monday, Tuesday, Thursday, Friday, Saturday (5 days per week). Take 2mg  by mouth once daily on Wednesday and Sunday (2 days per week).  Note interaction with levofloxacin (new medication as of 7/12).          Review of Systems   Unable to perform ROS: Intubated       Vitals:  ABP: (66-136)/(48-62)   Temp:  [36.4 ???C (97.5 ???F)-37.4 ???C (99.3 ???F)]   Pulse:  [80-112]   Respirations:  [15 PER MINUTE-30 PER MINUTE]   SpO2:  [97 %-100 %]     Physical Exam  Vitals signs reviewed.   Constitutional:       General: She is not in acute distress.     Appearance: She is obese. She is ill-appearing.   HENT:      Head: Normocephalic and atraumatic.   Cardiovascular:      Rate and Rhythm: Normal rate. Rhythm irregular.   Pulmonary:      Comments: Intubated, symmetric chest rise.  Abdominal:      General: There is no distension.      Palpations: Abdomen is soft.      Tenderness: There is no abdominal tenderness (no grimacing to palpation). There is no guarding.      Comments: Left flank with small wound covered by ostomy bag and feculent material present   Musculoskeletal:         General: No deformity.   Skin:     General: Skin is warm and dry.      Coloration: Skin is pale.   Neurological:      Comments: Sedated, GCS 7 (E2 V1 M4)         Lab/Radiology/Other Diagnostic Tests:  Lab Results   Component Value Date/Time    NA 137 05/17/2018 11:20 AM    K 4.1 05/17/2018 11:20 AM    CL 102 05/17/2018 11:20 AM    CO2 28 05/17/2018 11:20 AM and elevated white blood cell count, the leading consideration is pneumonia.       ABDOMEN AND PELVIS:      1. No abscess or acute inflammatory process within the abdomen or pelvis.   2. Moderate distention of the proximal colon with retained fecal material, which suggestive of colonic pseudoobstruction or constipation.          Approved by Joseph Berkshire, M.D. on 05/15/2018 11:45 AM      By my electronic signature, I attest that I have personally reviewed the images for this examination and formulated the interpretations and opinions expressed in this report          Finalized by Prince Rome, D.O. on 05/16/2018 6:29 AM. Dictated by Joseph Berkshire, M.D. on 05/15/2018 10:34 AM.         CT CHEST W CONTRAST   Final Result         CHEST:      1. Diffuse bilateral pulmonary opacities. Given the patient's bacteremia and  elevated white blood cell count, the leading consideration is pneumonia.       ABDOMEN AND PELVIS:      1. No abscess or acute inflammatory process within the abdomen or pelvis.   2. Moderate distention of the proximal colon with retained fecal material, which suggestive of colonic pseudoobstruction or constipation.          Approved by Joseph Berkshire, M.D. on 05/15/2018 11:45 AM      By my electronic signature, I attest that I have personally reviewed the images for this examination and formulated the interpretations and opinions expressed in this report          Finalized by Prince Rome, D.O. on 05/16/2018 6:29 AM. Dictated by Joseph Berkshire, M.D. on 05/15/2018 10:34 AM.         CHEST SINGLE VIEW   Final Result         Mild progression in a diffuse pulmonary and perihilar opacities greatest in the right lower lung.          Approved by Elfredia Nevins, M.D. on 05/15/2018 10:25 AM      By my electronic signature, I attest that I have personally reviewed the images for this examination and formulated the interpretations and opinions expressed in this report Finalized by Arlana Hove, M.D. on 05/15/2018 10:38 AM. Dictated by Elfredia Nevins, M.D. on 05/15/2018 8:59 AM.         ABDOMEN AP ONLY   Final Result         Indwelling tubes as above.          Finalized by Darrick Meigs, M.D. on 05/15/2018 5:55 AM. Dictated by Darrick Meigs, M.D. on 05/15/2018 5:55 AM.         ABDOMEN AP ONLY   Final Result         Indwelling tubes as above.          Finalized by Darrick Meigs, M.D. on 05/15/2018 5:55 AM. Dictated by Darrick Meigs, M.D. on 05/15/2018 5:54 AM.         ABDOMEN AP ONLY   Final Result         Indwelling tubes as above.          Finalized by Darrick Meigs, M.D. on 05/15/2018 5:54 AM. Dictated by Darrick Meigs, M.D. on 05/15/2018 5:54 AM.         ABDOMEN AP ONLY   Final Result         Enteric tube as above.          Finalized by Darrick Meigs, M.D. on 05/15/2018 5:53 AM. Dictated by Darrick Meigs, M.D. on 05/15/2018 5:52 AM.         ABDOMEN AP ONLY   Final Result         Enteric tube as above.          Finalized by Darrick Meigs, M.D. on 05/15/2018 5:54 AM. Dictated by Darrick Meigs, M.D. on 05/15/2018 5:53 AM.         ABDOMEN AP ONLY   Final Result         Indwelling tubes as above.          Finalized by Darrick Meigs, M.D. on 05/15/2018 5:58 AM. Dictated by Darrick Meigs, M.D. on 05/15/2018 5:58 AM.         2-D + DOPPLER ECHOCARDIOGRAM   Final Result      CHEST SINGLE VIEW   Final Result         No significant change  in diffuse mixed opacities throughout both lungs. Differential considerations include pneumonia, sepsis, ARDS, or other noncardiogenic edema.          Approved by Elfredia Nevins, M.D. on 05/14/2018 9:10 AM      By my electronic signature, I attest that I have personally reviewed the images for this examination and formulated the interpretations and opinions expressed in this report          Finalized by Darel Hong, M.D. on 05/14/2018 9:59 AM. Dictated by Elfredia Nevins, M.D. on 05/14/2018 8:36 AM.         ABDOMEN AP ONLY   Final Result 1.  Persistent diffuse mixed pulmonary opacities.   2.  Right and left IJ central lines as described. The malpositioned gastric tube has been repositioned at the time of this interpretation.      By my electronic signature, I attest that I have personally reviewed the images for this examination and formulated the interpretations and opinions expressed in this report          Finalized by Kathyrn Lass, M.D. on 05/13/2018 9:53 AM. Dictated by Norva Pavlov, MD on 05/13/2018 9:25 AM.         ABDOMEN AP ONLY   Final Result         1.  Gastric tube which likely has its tip overlying the distal esophagus. Repositioning/advancement is recommended into the distended stomach.   2.  Diffuse multifocal mixed bilateral pulmonary opacities, likely representing pneumonia, edema, and/or ARDS.      Dr. Mordecai Maes discussed preliminary findings with the resident taking care of the patient by telephone on 05/12/2018.      By my electronic signature, I attest that I have personally reviewed the images for this examination and formulated the interpretations and opinions expressed in this report          Finalized by Sherolyn Buba, M.D. on 05/12/2018 4:23 PM. Dictated by Richarda Osmond, MD on 05/12/2018 4:09 PM.         CHEST SINGLE VIEW   Final Result         1.  Persistent diffuse multifocal bilateral mixed pulmonary opacities, likely with pneumonia, edema and/or ARDS.   2.  Lines and tubes as described. Gastric tube tip overlies the distal esophagus and could be advanced for more optimal positioning.      By my electronic signature, I attest that I have personally reviewed the images for this examination and formulated the interpretations and opinions expressed in this report          Finalized by Kathyrn Lass, M.D. on 05/13/2018 9:14 AM. Dictated by Norva Pavlov, MD on 05/13/2018 7:29 AM.         CHEST SINGLE VIEW   Final Result         Progression in diffuse multifocal bilateral mixed pulmonary opacities,

## 2018-05-17 NOTE — Procedures
ARTERIAL LINE (A-Line) PLACEMENT  Date: 05/17/2018  Time: 1:20 AM  Indication: Hemodynamic monitoring, previous arterial had poor waveform and not reading well  Attending: Dr. Hetty Ely    A time-out was completed verifying correct patient, procedure, site, positioning, and special equipment if applicable. [name]???s test was performed to ensure adequate perfusion. The patient???s right wrist was prepped and draped in sterile fashion. 1% Lidocaine was used to anesthetize the area. Using Ultrasound guidance a 20G Arrow arterial line was introduced into the radial artery. The catheter was threaded over the guide wire and the needle was removed with appropriate pulsatile blood return. The catheter was then kept in place by sterile dressing and tegaderm. Perfusion to the extremity distal to the point of catheter insertion was checked and found to be adequate. Attending, Dr. Hetty Ely was available if needed for the entire procedure. [name], APRN-NP assisted with the procedure.    Estimated Blood Loss: 2 cc    The patient tolerated the procedure well and there were no complications. The previous arterial line on the left radial artery was removed.         Lyndel Safe, MD  # [phone]  # Cardiology Fellow, PGY-4

## 2018-05-17 NOTE — Other
Arterial Catheter Insertion    The right wrist was sterilely prepped with chlorhexadine.  A 20 gauge arterial catheter was placed via modified Seldinger technique and taped into place.  1 needle stick(s) was/were required for cannulation. Placement confirmed by ultrasound guidance. Dr. Hedda Slade was present and Dr. Hetty Ely available for entire procedure.      Verification:  Patency verified by positive blood return.             Michelle Roes, APRN-NP  Cardiology critical care  05/17/2018

## 2018-05-17 NOTE — Progress Notes
Critical Care Progress Note      Today's Date:  05/17/2018  Name:  Michelle Solis                       MRN:  [id number]   Admission Date: 05/19/2018  LOS: 6 days                     Assessment/Plan:   Principal Problem:    Septic shock (HCC)  Active Problems:    COPD (chronic obstructive pulmonary disease) (HCC)    Acute respiratory failure with hypoxia (HCC)    Iron deficiency anemia    S/P TAVR (transcatheter aortic valve replacement)    ARDS (adult respiratory distress syndrome) (HCC)    PEA (Pulseless electrical activity) (HCC)    Complete heart block (HCC)    Temporary transvenous cardiac pacemaker present    AKI (acute kidney injury) (HCC)    Type 2 NSTEMI (non-ST elevated myocardial infarction) (HCC)    Acute on chronic heart failure with preserved ejection fraction (HFpEF) (HCC)    Atrial fibrillation with RVR (HCC)    Elevated LFTs    Supratherapeutic INR    Recent urinary tract infection    Warfarin anticoagulation    Positive blood cultures    ATN (acute tubular necrosis) (HCC)    Encounter for continuous renal replacement therapy (CRRT) for acute renal failure (HCC)        Michelle Solis is a 77yo WF with PMH AS s/p TAVR, 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     Neuro: Currently sedated on propofol and fentanyl. Comfortable. Will pursue daily awakening trials.   Cardiac: Remains on high dose norepi and vasopressin with increased requirements overnight.  Remains in a-fib.  Rate controlled.  Holding amio.    Pulmonary: Remains intubated with high ventilator settings, required increasing settings overnight for oxygenation.  CXR stable but she is clinically worse today.  CT with ARDS pattern.     FEN: NPO.  Hold enteral feeds as she has large gastric and colonic stool burden on CT.  Will cont enemas, pro-motility agents and methylnaltrexone. Antibiotic Usage:  Yes; Infection present or suspected:  Empiric  VTE: Per Primary  _____________________________________________________________________________    I have seen, examined and reviewed data concerning this patient.  I discussed the findings and plan of care with the ICU team. I spent 115 minutes in critical care time, excluding procedures today.    Michelle Moment, MD  Assistant Professor  Anesthesiology/Critical Care Medicine  Pager: [phone]

## 2018-05-17 NOTE — Progress Notes
personally directing critical care services and coordinating care with ICU.  This includes time spent at patient's bedside, and serial assessment of labs, imaging studies and interpreting hemodynamic data.This also includes management of fluids,???electrolytes, acid-base???and personal discussions with the critical???care team.       Daphene Jaeger, DO  Pager [phone]      Subjective     HPI: Michelle Solis is a 78 y.o. female remains intubutated and sedated. She has a planned family meeting today. She did not have a good night and has not been tolerating fluid removal. She is off heparin this AM for a procedure.       Medications  MEDSalbuterol 0.5%, 2.5 mg, Inhalation, Q4H & PRN  artificial tears/hypromellose, 1 drop, Both Eyes, QID  aspirin, 81 mg, Per NG tube, QDAY  atorvastatin, 40 mg, Per NG tube, QDAY  budesonide respule, 0.25 mg, Inhalation, BID  cefepime  ( MAXIPIME ) IVPB, 2 g, Intravenous, Q12H*  chlorhexidine gluconate, 15 mL, Swish & Spit, BID  insulin aspart U-100, 0-6 Units, Subcutaneous, 5 X Daily  ipratropium bromide, 0.5 mg, Inhalation, Q4H & PRN  metroNIDAZOLE  (FLAGYL) IVPB, 500 mg, Intravenous, Q8H*  pantoprazole, 40 mg, Intravenous, BID(11-21)  polyethylene glycol 3350, 2 packet, Oral, BID  vancomycin, random dosing, 1 each, Intravenous, Random Dosing     IV MEDS  ??? anticoagulant citrate dextrose (ACD-A) solution (formula A) 1,000 mL 1,000 mL (05/17/18 0913)   ??? BGK 4/0/1.2 (PRISMASOL) 5,000 mL dialysate 2,800 mL/hr at 05/17/18 0953   ??? BGK 4/0/1.2 (PRISMASOL) 5,000 mL replacement (post-filter) solution 1,000 mL/hr at 05/17/18 0945   ??? calcium chloride 8 g in sodium chloride 0.9% (NS) 1,080 mL IV Infusion 110 mL/hr at 05/17/18 0721   ??? fentaNYL (SUBLIMAZE)  1000 mcg/100 mL NS IV drip (std conc)(premade) 75 mcg/hr (05/17/18 0943)   ??? insulin regular 100 units/NS 100 mL IV drip (premade) Stopped (05/17/18 3295)   ??? norepinephrine (LEVOPHED) 16 mg in dextrose 5% (D5W) 250 mL IV drip

## 2018-05-18 ENCOUNTER — Inpatient Hospital Stay: Admit: 2018-05-14 | Discharge: 2018-05-14 | Payer: MEDICARE

## 2018-05-18 ENCOUNTER — Inpatient Hospital Stay: Admit: 2018-05-12 | Discharge: 2018-05-12 | Payer: MEDICARE

## 2018-05-18 ENCOUNTER — Inpatient Hospital Stay: Admit: 2018-05-11 | Discharge: 2018-05-11 | Payer: MEDICARE

## 2018-05-18 ENCOUNTER — Inpatient Hospital Stay: Admit: 2018-05-15 | Discharge: 2018-05-16 | Payer: MEDICARE

## 2018-05-18 ENCOUNTER — Inpatient Hospital Stay: Admit: 2018-05-13 | Discharge: 2018-05-13 | Payer: MEDICARE

## 2018-05-18 ENCOUNTER — Inpatient Hospital Stay: Admit: 2018-05-15 | Discharge: 2018-05-15 | Payer: MEDICARE

## 2018-05-18 ENCOUNTER — Inpatient Hospital Stay
Admit: 2018-05-11 | Discharge: 2018-06-06 | Disposition: E | Payer: MEDICARE | Source: Other Acute Inpatient Hospital | Attending: Cardiovascular Disease | Admitting: Cardiovascular Disease

## 2018-05-18 ENCOUNTER — Inpatient Hospital Stay: Admit: 2018-05-18 | Discharge: 2018-05-18 | Payer: MEDICARE

## 2018-05-18 ENCOUNTER — Inpatient Hospital Stay: Admit: 2018-05-16 | Discharge: 2018-05-16 | Payer: MEDICARE

## 2018-05-18 ENCOUNTER — Inpatient Hospital Stay: Admit: 2018-05-17 | Discharge: 2018-05-17 | Payer: MEDICARE

## 2018-05-18 DIAGNOSIS — R6521 Severe sepsis with septic shock: ICD-10-CM

## 2018-05-18 DIAGNOSIS — Z7901 Long term (current) use of anticoagulants: ICD-10-CM

## 2018-05-18 DIAGNOSIS — R34 Anuria and oliguria: ICD-10-CM

## 2018-05-18 DIAGNOSIS — B9689 Other specified bacterial agents as the cause of diseases classified elsewhere: ICD-10-CM

## 2018-05-18 DIAGNOSIS — K922 Gastrointestinal hemorrhage, unspecified: ICD-10-CM

## 2018-05-18 DIAGNOSIS — I472 Ventricular tachycardia: ICD-10-CM

## 2018-05-18 DIAGNOSIS — J180 Bronchopneumonia, unspecified organism: ICD-10-CM

## 2018-05-18 DIAGNOSIS — I21A1 Myocardial infarction type 2: ICD-10-CM

## 2018-05-18 DIAGNOSIS — N170 Acute kidney failure with tubular necrosis: ICD-10-CM

## 2018-05-18 DIAGNOSIS — I442 Atrioventricular block, complete: ICD-10-CM

## 2018-05-18 DIAGNOSIS — Z87891 Personal history of nicotine dependence: ICD-10-CM

## 2018-05-18 DIAGNOSIS — N390 Urinary tract infection, site not specified: ICD-10-CM

## 2018-05-18 DIAGNOSIS — E874 Mixed disorder of acid-base balance: ICD-10-CM

## 2018-05-18 DIAGNOSIS — E872 Acidosis: ICD-10-CM

## 2018-05-18 DIAGNOSIS — Z982 Presence of cerebrospinal fluid drainage device: ICD-10-CM

## 2018-05-18 DIAGNOSIS — K2970 Gastritis, unspecified, without bleeding: ICD-10-CM

## 2018-05-18 DIAGNOSIS — E785 Hyperlipidemia, unspecified: ICD-10-CM

## 2018-05-18 DIAGNOSIS — Z881 Allergy status to other antibiotic agents status: ICD-10-CM

## 2018-05-18 DIAGNOSIS — D509 Iron deficiency anemia, unspecified: ICD-10-CM

## 2018-05-18 DIAGNOSIS — Z90710 Acquired absence of both cervix and uterus: ICD-10-CM

## 2018-05-18 DIAGNOSIS — I447 Left bundle-branch block, unspecified: ICD-10-CM

## 2018-05-18 DIAGNOSIS — J9621 Acute and chronic respiratory failure with hypoxia: ICD-10-CM

## 2018-05-18 DIAGNOSIS — Z66 Do not resuscitate: ICD-10-CM

## 2018-05-18 DIAGNOSIS — I5033 Acute on chronic diastolic (congestive) heart failure: ICD-10-CM

## 2018-05-18 DIAGNOSIS — R791 Abnormal coagulation profile: ICD-10-CM

## 2018-05-18 DIAGNOSIS — Z9981 Dependence on supplemental oxygen: ICD-10-CM

## 2018-05-18 DIAGNOSIS — Z96659 Presence of unspecified artificial knee joint: ICD-10-CM

## 2018-05-18 DIAGNOSIS — A4150 Gram-negative sepsis, unspecified: Principal | ICD-10-CM

## 2018-05-18 DIAGNOSIS — Z952 Presence of prosthetic heart valve: ICD-10-CM

## 2018-05-18 DIAGNOSIS — I110 Hypertensive heart disease with heart failure: ICD-10-CM

## 2018-05-18 DIAGNOSIS — J440 Chronic obstructive pulmonary disease with acute lower respiratory infection: ICD-10-CM

## 2018-05-18 DIAGNOSIS — A419 Sepsis, unspecified organism: Secondary | ICD-10-CM

## 2018-05-18 DIAGNOSIS — Z515 Encounter for palliative care: ICD-10-CM

## 2018-05-18 DIAGNOSIS — E669 Obesity, unspecified: ICD-10-CM

## 2018-05-18 DIAGNOSIS — K632 Fistula of intestine: ICD-10-CM

## 2018-05-18 DIAGNOSIS — I469 Cardiac arrest, cause unspecified: ICD-10-CM

## 2018-05-18 DIAGNOSIS — Z6841 Body Mass Index (BMI) 40.0 and over, adult: ICD-10-CM

## 2018-05-18 DIAGNOSIS — I482 Chronic atrial fibrillation, unspecified: ICD-10-CM

## 2018-05-18 DIAGNOSIS — K7200 Acute and subacute hepatic failure without coma: ICD-10-CM

## 2018-05-18 DIAGNOSIS — E46 Unspecified protein-calorie malnutrition: ICD-10-CM

## 2018-05-18 DIAGNOSIS — Z8661 Personal history of infections of the central nervous system: ICD-10-CM

## 2018-05-18 LAB — IONIZED CALCIUM, POST FILTER
Lab: 0.4 MMOL/L
Lab: 0.4 MMOL/L

## 2018-05-18 LAB — POC GLUCOSE
Lab: 179 mg/dL — ABNORMAL HIGH (ref 70–100)
Lab: 168 mg/dL — ABNORMAL HIGH (ref 70–100)
Lab: 172 mg/dL — ABNORMAL HIGH (ref 70–100)

## 2018-05-18 LAB — IONIZED CALCIUM,BG
Lab: 1.1 MMOL/L (ref 1.0–1.3)
Lab: 1.1 MMOL/L (ref 1.0–1.3)

## 2018-05-18 LAB — PHOSPHORUS  CELLULAR THERAPEUTICS: Lab: 2.2 mg/dL (ref 2.0–4.5)

## 2018-05-18 LAB — BLOOD GASES, ARTERIAL: Lab: 52 mmHg — ABNORMAL HIGH (ref 35–45)

## 2018-05-18 LAB — CULTURE-BLOOD W/SENSITIVITY: Lab: POSITIVE

## 2018-05-18 LAB — MAGNESIUM  CELLULAR THERAPEUTICS: Lab: 1.7 mg/dL (ref >60)

## 2018-05-18 MED ORDER — GLYCOPYRROLATE 0.2 MG/ML IJ SOLN
0.4 mg | Freq: Once | INTRAVENOUS | 0 refills | Status: DC
Start: 2018-05-18 — End: 2018-05-18

## 2018-05-18 MED ORDER — BISACODYL 10 MG RE SUPP
10 mg | Freq: Every day | RECTAL | 0 refills | Status: DC | PRN
Start: 2018-05-18 — End: 2018-05-18

## 2018-05-18 MED ORDER — FENTANYL DRIP IN NS 1000MCG/100ML
10-300 ug/h | INTRAVENOUS | 0 refills | Status: DC
Start: 2018-05-18 — End: 2018-05-18

## 2018-05-18 MED ORDER — MIDAZOLAM 1 MG/ML IJ SOLN
1-2 mg | INTRAVENOUS | 0 refills | Status: DC | PRN
Start: 2018-05-18 — End: 2018-05-18
  Administered 2018-05-18: 16:00:00 2 mg via INTRAVENOUS

## 2018-05-18 MED ORDER — GLYCOPYRROLATE 0.2 MG/ML IJ SOLN
.4-.8 mg | INTRAVENOUS | 0 refills | Status: DC | PRN
Start: 2018-05-18 — End: 2018-05-18
  Administered 2018-05-18 (×2): 0.4 mg via INTRAVENOUS

## 2018-05-18 MED ORDER — LORAZEPAM 2 MG/ML IJ SOLN
1-2 mg | INTRAVENOUS | 0 refills | Status: DC | PRN
Start: 2018-05-18 — End: 2018-05-18

## 2018-05-18 NOTE — Progress Notes
Heart Failure Nursing Progress Note    Admission Date: May 28, 2018  LOS: 7 days    Admission Weight: 125.9 kg (277 lb 9 oz)        Most recent weights (inpatient):   Vitals:    05/15/18 0400 05/17/18 0500 05/09/2018 0500   Weight: 121.5 kg (267 lb 13.7 oz) 121.7 kg (268 lb 4.8 oz) 120.7 kg (266 lb 1.5 oz)     Weight change from previous day: -1 kg    Fluid restriction ordered: Pt is NPO    Intake/Output Summary: (Last 24 hours)    Intake/Output Summary (Last 24 hours) at 05/20/2018 1610  Last data filed at 05/06/2018 0600  Gross per 24 hour   Intake 5564.69 ml   Output 6758 ml   Net -1193.31 ml       Is patient incontinent: Pt is anuric    Anticipated discharge date: TBD  Discharge goals:       Daily Assessment of Patient Stated Goals:    Short Term Goal Identified by patient (Short Term=during hospitalization):  Pt intubated and sedated

## 2018-05-20 LAB — CULTURE-BLOOD W/SENSITIVITY

## 2018-05-20 LAB — AEROBIC BACTERIA, SUSCEPT

## 2018-05-22 LAB — CULTURE-WOUND/TISSUE/FLUID(AEROBIC ONLY)W/SENSITIVITY

## 2018-05-22 LAB — CULTURE-ANAEROBIC

## 2018-06-06 DEATH — deceased

## 2018-06-11 LAB — CULTURE-FUNGAL,OTHER

## 2018-07-02 LAB — CULTURE-TB (AFB)

## 2019-08-14 IMAGING — CR ABDOMEN
5 series · 5 of 5 positions shown · non-contrast
Comparison: none

[abdomen upright]
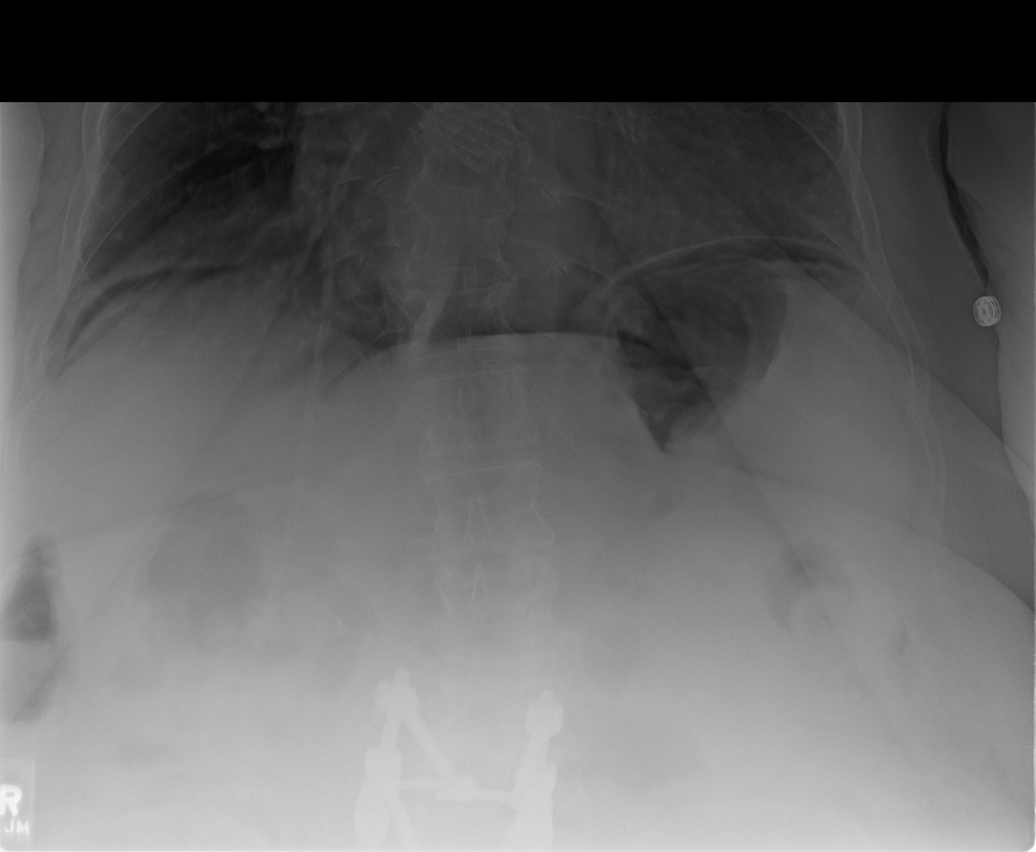

[abd supine x-wise (1 of 2)]
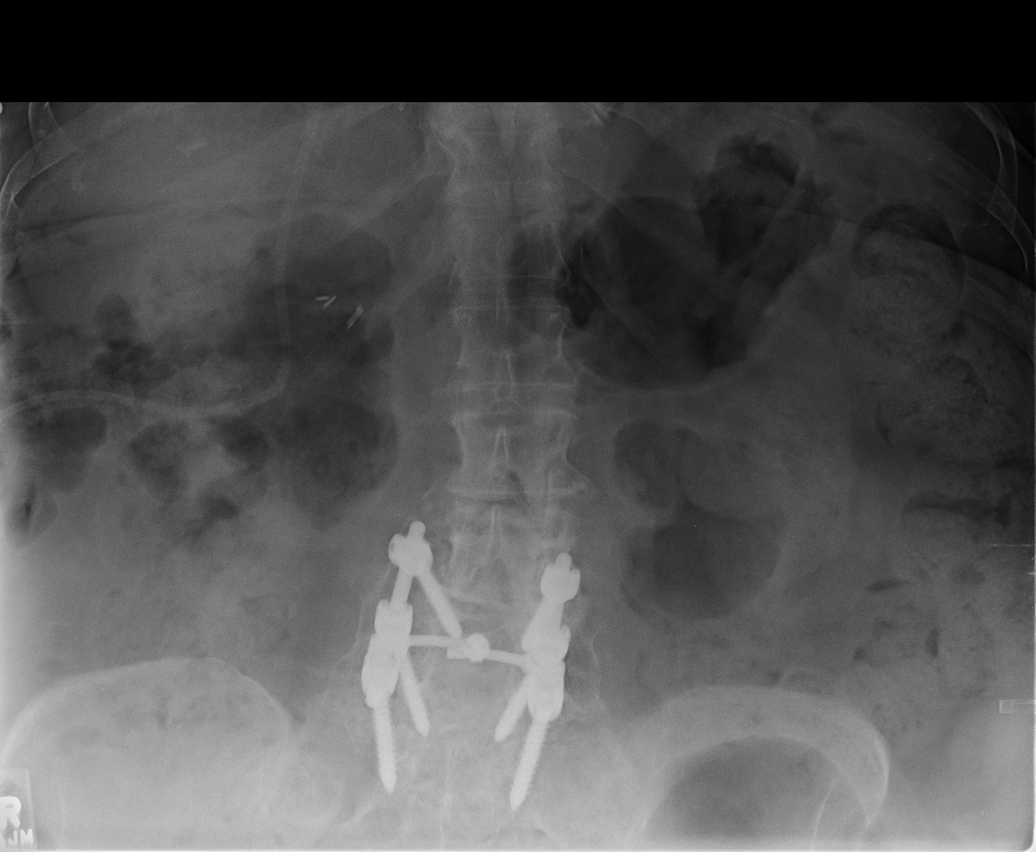

[abdomen supine kub (1 of 2)]
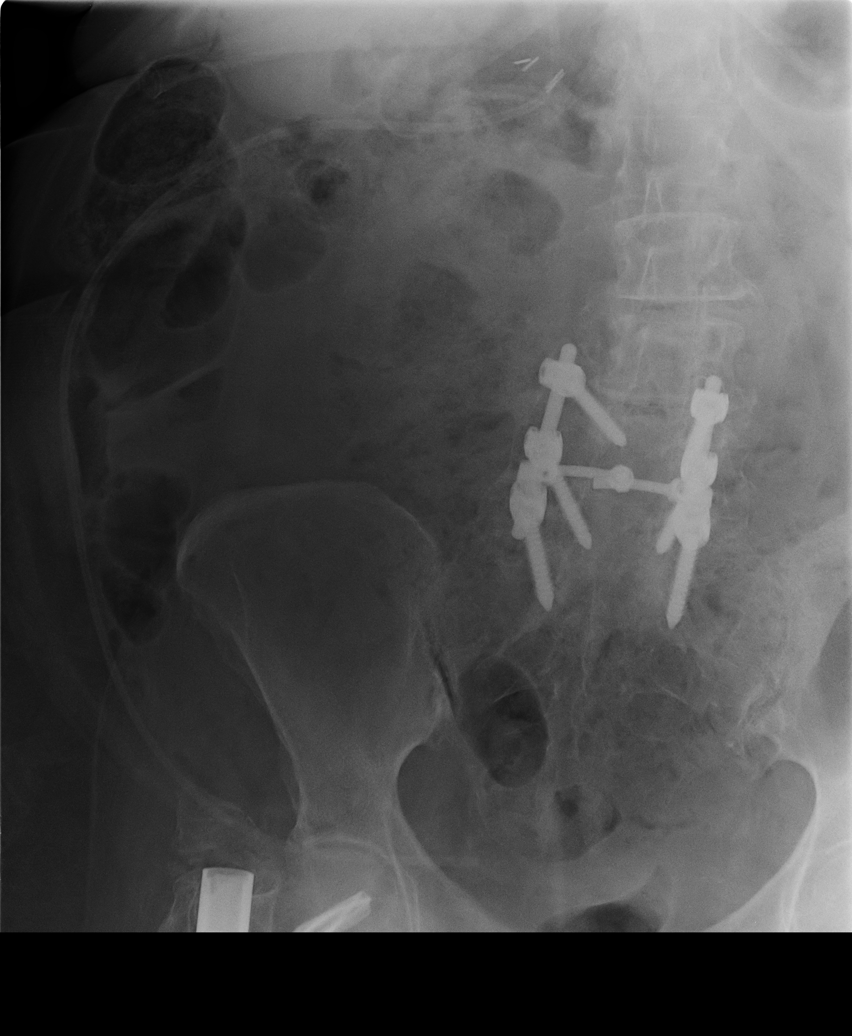

[abdomen supine kub (2 of 2)]
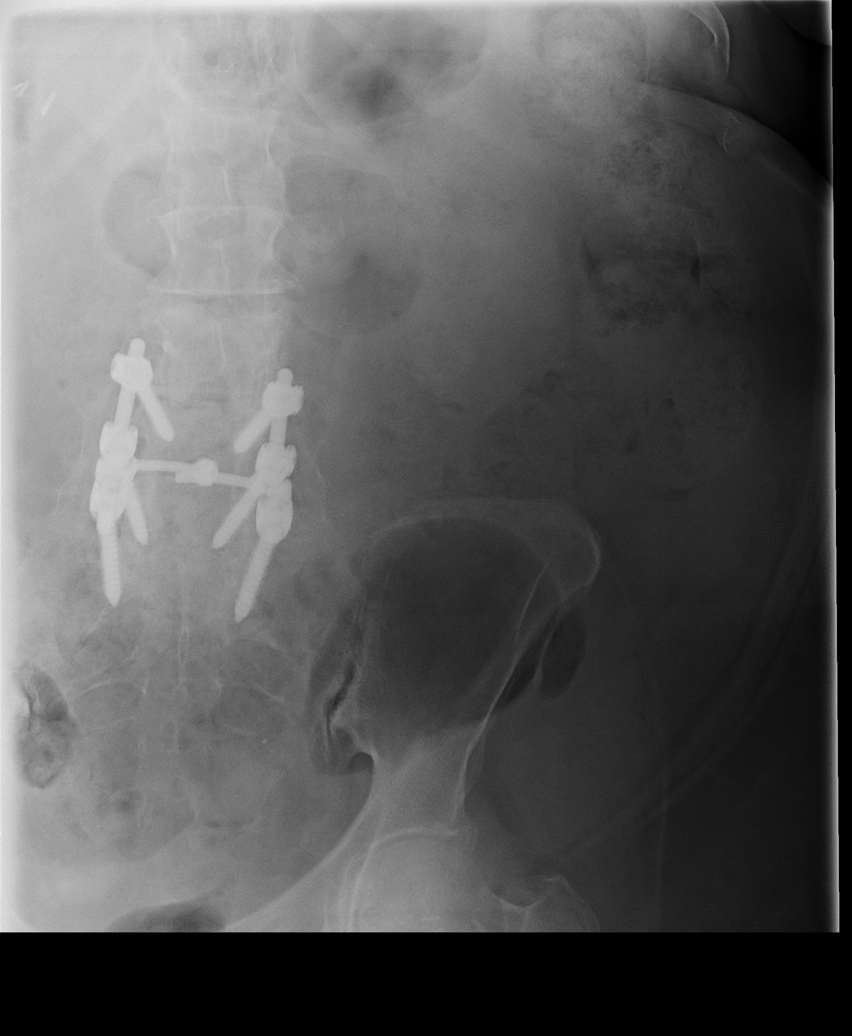

[abd supine x-wise (2 of 2)]
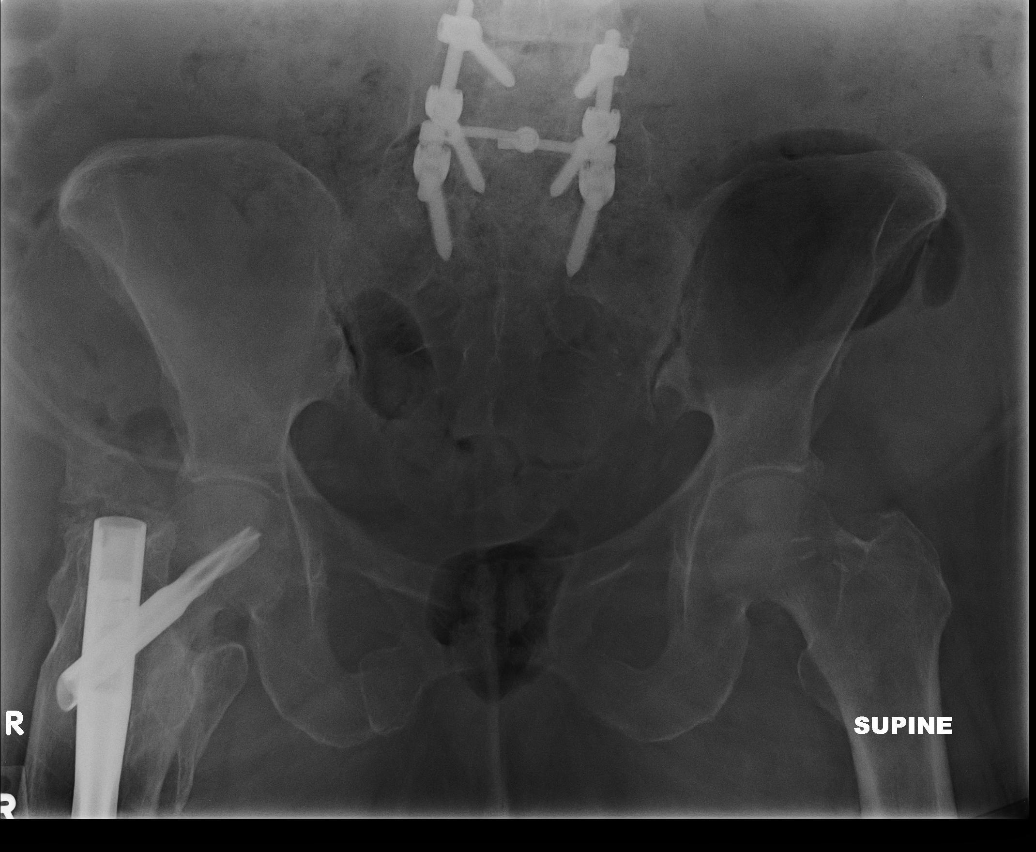

[5 of 5 positions shown; findings below may reference images not displayed]

XR abd 2v, upright and supine Sex:

DIAGNOSTIC STUDIES

EXAM
Abdominal exam

INDICATION
Left lower quadrant pain and fever

TECHNIQUE
Four views supine and upright abdomen

COMPARISONS
CT abdomen and pelvis 04/10/2017

FINDINGS
There is a small amount of air between both hemidiaphragms concerning for perforated viscus. There
is tubing seen in the right abdomen, possibly abdominal drain versus external to the patient. The
bowel gas pattern is normal. Air seen to the rectum. There is constipation. There has been a prior
lower lumbar fusion and repair of intertrochanteric fracture of the right femur. There is no bowel
obstruction.

IMPRESSION
Constipation without bowel obstruction. Lucency between both hemidiaphragms most consistent with a
small amount of free air. CT could further evaluate if indicated. This was related to Dr. Sang

Tech Notes:

LLQ PAIN AND FEVER SINCE YESTERDAY.  CONSTIPATION.  AB

## 2019-08-14 IMAGING — CT Abdomen^1_ABDOMEN_PELVIS_WITHOUT (Adult)
1 series · 14 of 32 positions shown, 18 images · non-contrast
Comparison: none

[Series 3: abd/pelvis w/o 5.0 soft tissue · axial · non-contrast · 0.88mm/px · z∈[+713,+1118]mm · 14 of 90 slices shown, 18 images]
[im 6/90  soft-tissue]
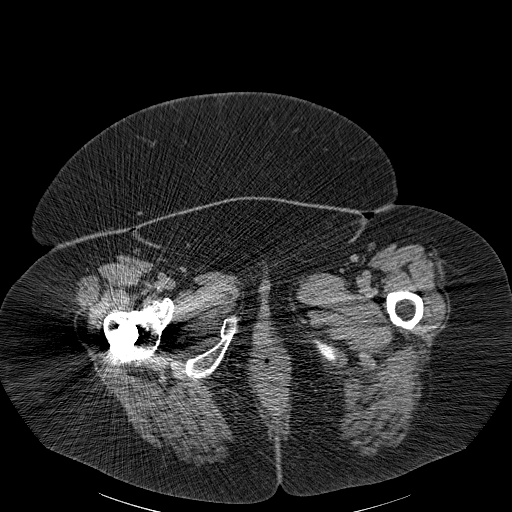
[im 6/90  bone]
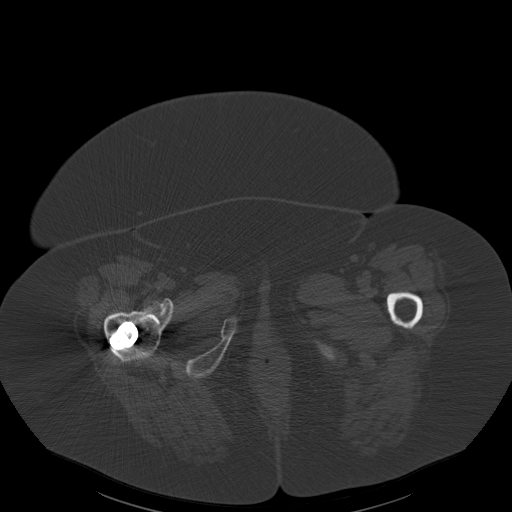
[im 12/90  soft-tissue]
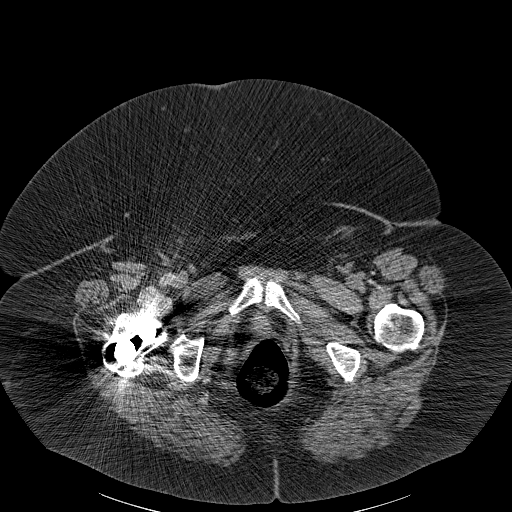
[im 21/90  soft-tissue]
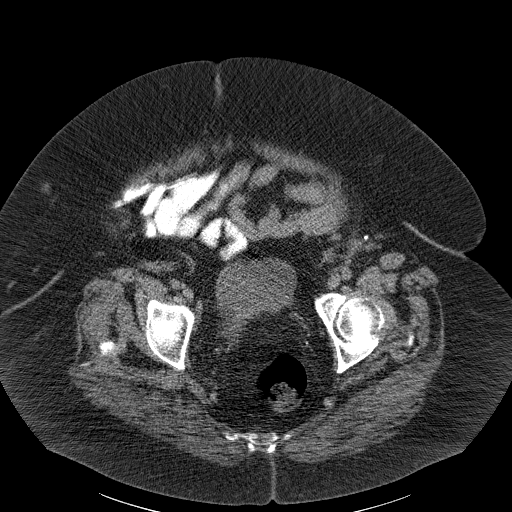
[im 26/90  soft-tissue]
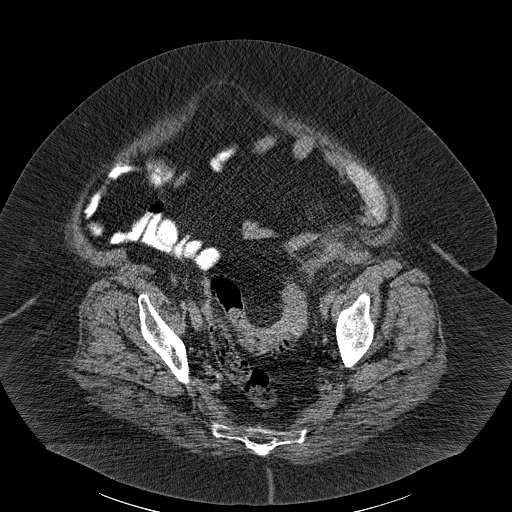
[im 35/90  soft-tissue]
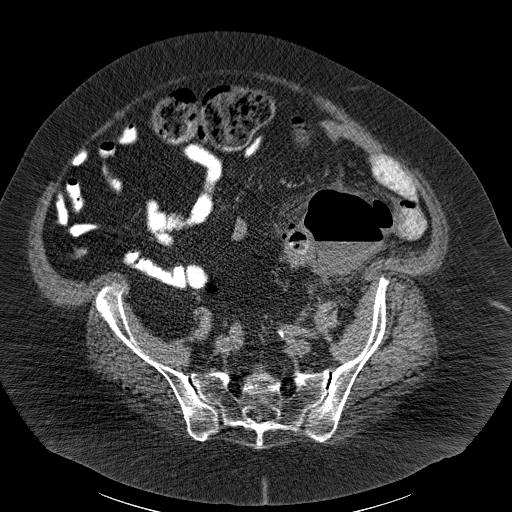
[im 41/90  soft-tissue]
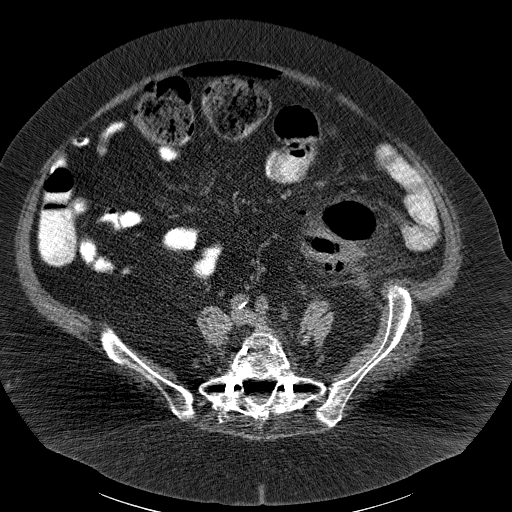
[im 49/90  soft-tissue]
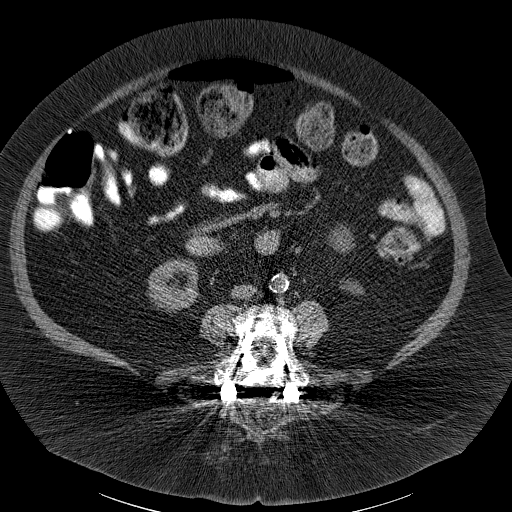
[im 55/90  soft-tissue]
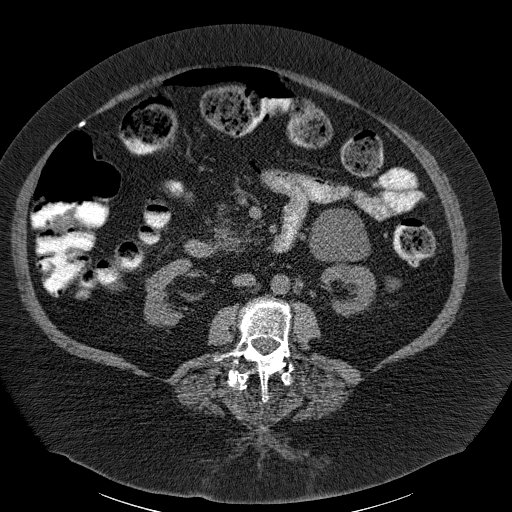
[im 64/90  soft-tissue]
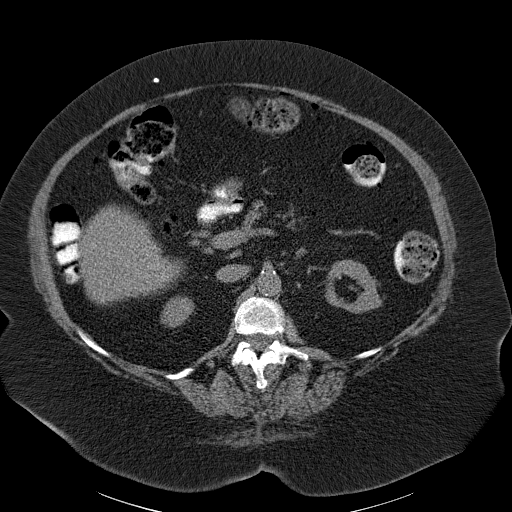
[im 64/90  bone]
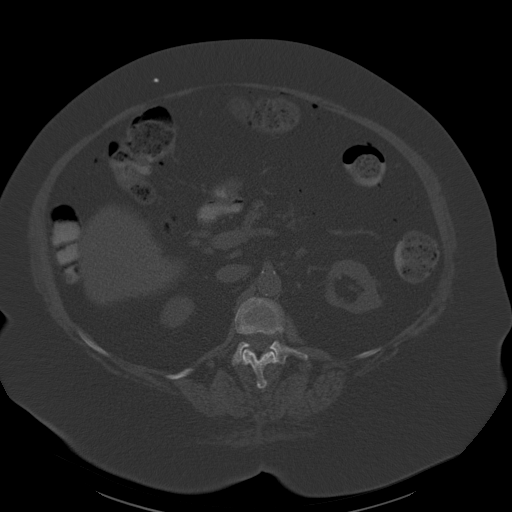
[im 69/90  soft-tissue]
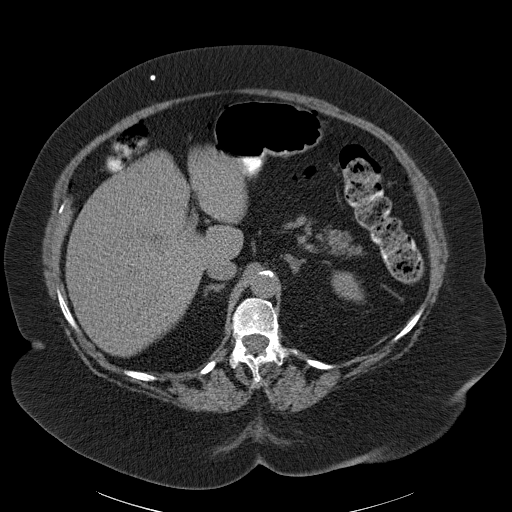
[im 78/90  soft-tissue]
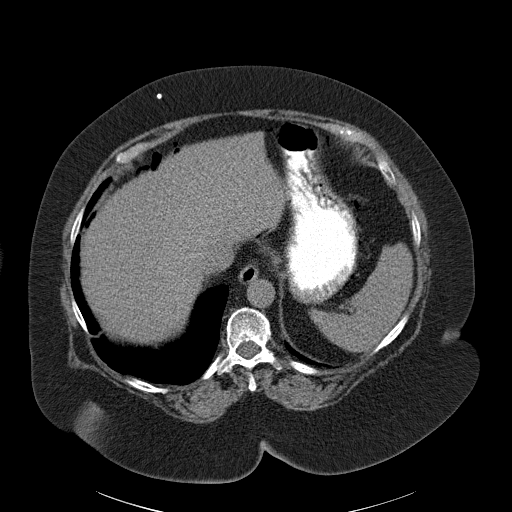
[im 78/90  lung]
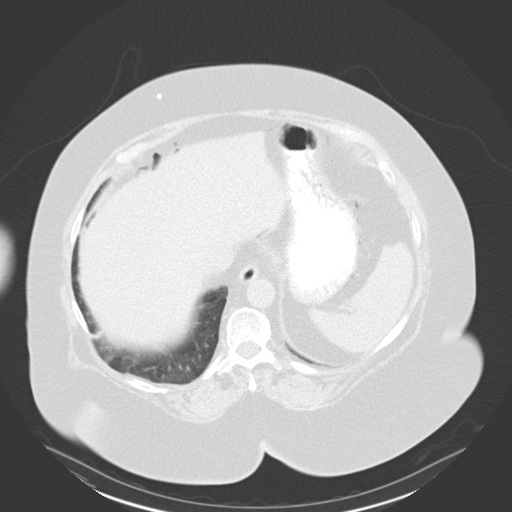
[im 81/90  lung]
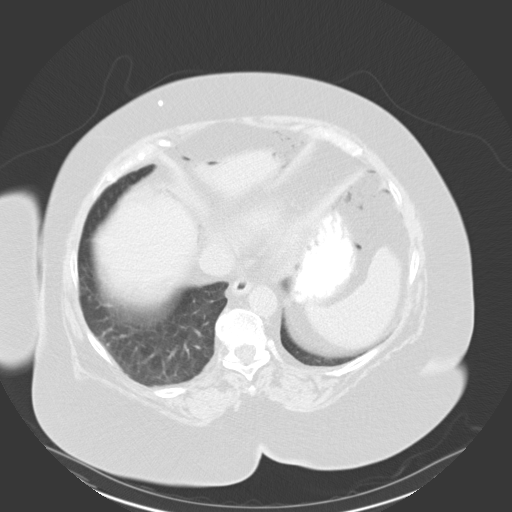
[im 84/90  soft-tissue]
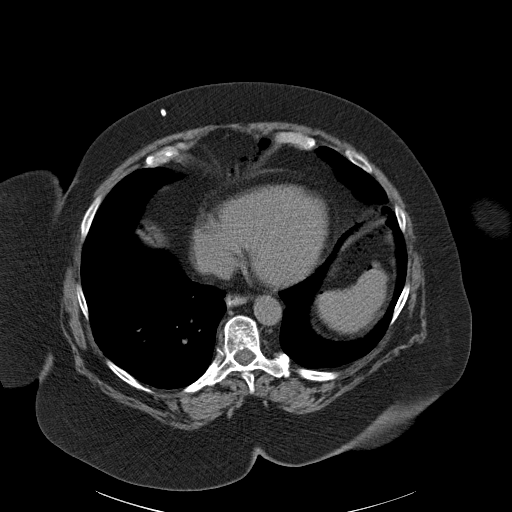
[im 84/90  lung]
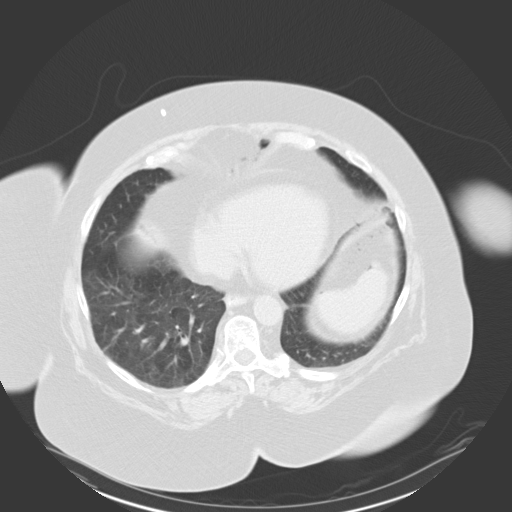
[im 87/90  lung]
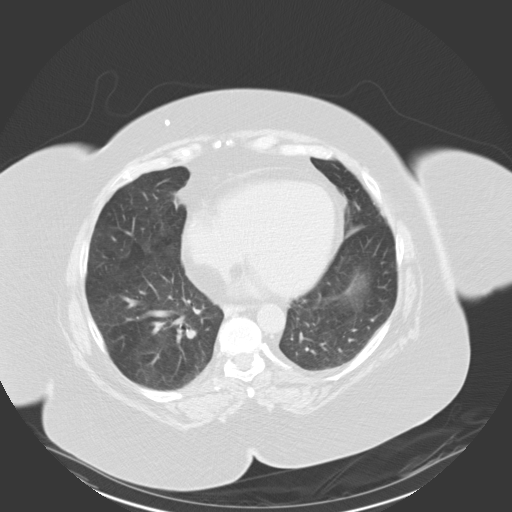

[14 of 32 positions shown; findings below may reference images not displayed]

DIAGNOSTIC STUDIES

EXAM
CT ABDOMEN AND PELVIS WITHOUT CONTRAST

INDICATION
questionable free air on plain film
LLQ PAIN AND LOW GRADE FEVER SINCE YESTERDAY. POSSIBLE FREE AIR SEEN ON X-RAY TODAY AND RECENT X-
RAY PER PATIENT. 50 ML OMNIPAQUE GIVEN ORALY. AB

TECHNIQUE
Contiguous transaxial imaging through the abdomen and pelvis was performed in the absence of
intravenous iodinated contrast material. Coronal and sagittal reformations were obtained from the
transaxial source data and created by the CT technologist at the workstation.

All CT scans at this facility use dose modulation, iterative reconstruction, and/or weight based
dosing when appropriate to reduce radiation dose to as low as reasonably achievable.

COMPARISONS
Same-day abdominal radiographs and CT abdomen and pelvis with contrast April 10, 2017.

FINDINGS
Calcified granuloma right lung base. Heart size normal. Small volume of free intraperitoneal
extraluminal gas is demonstrated.

Evaluation of the solid abdominopelvic viscera is limited in the absence of intravenous iodinated
contrast material. The nonopacified liver and spleen are normal in size and contour. Diffuse hepatic
steatosis. Cholecystectomy. No adrenal masses and no peripancreatic fat stranding is demonstrated.
No obstructing urolithiasis or hydronephrosis is identified. Atrophy bilateral kidneys. Left renal
cysts, largest anteriorly on the left measures up to 4.5 x 5.3 centimeter.

The non-opacified abdominal aorta and inferior vena cava are normal caliber. The large and small
bowel loops are normal caliber. The non-opacified urinary bladder is distended. Uterus is absent.
There is colonic diverticulosis with pericolonic inflammatory fat stranding and fluid in the
sigmoid consistent with diverticulitis. Furthermore, there is a pericolonic gas and fluid collection
along the sigmoid consistent with abscess measuring up to 8.4 x 7.9 x 8.4 centimeter, axial image 55
and coronal image 37. Free intraperitoneal gas seen is likely attributable to perforation from the
diverticulitis.

No destructive bone lesion is identified. Right hip fixation and old right iliac bone deformities
noted. Lumbosacral fixation noted. Left inguinal simple fluid collection measures 4.1 x
centimeter as compared to 4.5 x 2 centimeter previously, probably a seroma given adjacent surgical
clips.

IMPRESSION
Sigmoid diverticulitis with associated abscess and pneumoperitoneum as described.

Tech Notes:

LLQ PAIN AND LOW GRADE FEVER SINCE YESTERDAY.  POSSIBLE FREE AIR SEEN ON X-RAY TODAY AND RECENT X-
RAY PER PATIENT. 50 ML OMNIPAQUE GIVEN ORALY.  AB
